# Patient Record
Sex: Male | Born: 1960 | Race: White | Hispanic: No | Smoking: Never smoker
Health system: Southern US, Community
[De-identification: ages and names within clinical notes are randomized; demographics above are authoritative.]

## PROBLEM LIST (undated history)

## (undated) ENCOUNTER — Emergency Department: Admission: EM | Discharge status: Absent without leave | Payer: Self-pay

## (undated) DIAGNOSIS — G4730 Sleep apnea, unspecified: Secondary | ICD-10-CM

## (undated) DIAGNOSIS — I499 Cardiac arrhythmia, unspecified: Secondary | ICD-10-CM

## (undated) DIAGNOSIS — G629 Polyneuropathy, unspecified: Secondary | ICD-10-CM

## (undated) DIAGNOSIS — E785 Hyperlipidemia, unspecified: Secondary | ICD-10-CM

## (undated) DIAGNOSIS — F419 Anxiety disorder, unspecified: Secondary | ICD-10-CM

## (undated) DIAGNOSIS — B019 Varicella without complication: Secondary | ICD-10-CM

## (undated) DIAGNOSIS — I4891 Unspecified atrial fibrillation: Secondary | ICD-10-CM

## (undated) DIAGNOSIS — M2540 Effusion, unspecified joint: Secondary | ICD-10-CM

## (undated) DIAGNOSIS — G4733 Obstructive sleep apnea (adult) (pediatric): Secondary | ICD-10-CM

## (undated) DIAGNOSIS — M2550 Pain in unspecified joint: Secondary | ICD-10-CM

## (undated) DIAGNOSIS — F1010 Alcohol abuse, uncomplicated: Secondary | ICD-10-CM

## (undated) DIAGNOSIS — M1990 Unspecified osteoarthritis, unspecified site: Secondary | ICD-10-CM

## (undated) DIAGNOSIS — R200 Anesthesia of skin: Secondary | ICD-10-CM

## (undated) DIAGNOSIS — Z8489 Family history of other specified conditions: Secondary | ICD-10-CM

## (undated) DIAGNOSIS — R002 Palpitations: Secondary | ICD-10-CM

## (undated) DIAGNOSIS — I10 Essential (primary) hypertension: Secondary | ICD-10-CM

## (undated) HISTORY — DX: Alcohol abuse, uncomplicated: F10.10

## (undated) HISTORY — DX: Sleep apnea, unspecified: G47.30

## (undated) HISTORY — DX: Essential (primary) hypertension: I10

## (undated) HISTORY — PX: KNEE SURGERY: SHX244

## (undated) HISTORY — DX: Varicella without complication: B01.9

## (undated) HISTORY — PX: SPERMATOCELECTOMY: SHX2420

---
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
## 2004-02-22 HISTORY — PX: SHOULDER ARTHROSCOPY WITH OPEN ROTATOR CUFF REPAIR: SHX6092

## 2004-02-22 HISTORY — PX: INCISION AND DRAINAGE OF WOUND: SHX1803

## 2004-07-02 ENCOUNTER — Ambulatory Visit (HOSPITAL_COMMUNITY): Admission: RE | Admit: 2004-07-02 | Discharge: 2004-07-03 | Payer: Self-pay | Admitting: Orthopedic Surgery

## 2004-07-13 ENCOUNTER — Ambulatory Visit (HOSPITAL_COMMUNITY): Admission: RE | Admit: 2004-07-13 | Discharge: 2004-07-13 | Payer: Self-pay | Admitting: Orthopedic Surgery

## 2004-07-13 ENCOUNTER — Ambulatory Visit: Payer: Self-pay | Admitting: Internal Medicine

## 2004-07-29 ENCOUNTER — Ambulatory Visit: Payer: Self-pay | Admitting: Internal Medicine

## 2006-05-10 ENCOUNTER — Emergency Department (HOSPITAL_COMMUNITY): Admission: EM | Admit: 2006-05-10 | Discharge: 2006-05-10 | Payer: Self-pay | Admitting: Family Medicine

## 2006-07-23 ENCOUNTER — Emergency Department: Payer: Self-pay | Admitting: Emergency Medicine

## 2008-06-16 ENCOUNTER — Encounter: Admission: RE | Admit: 2008-06-16 | Discharge: 2008-06-16 | Payer: Self-pay | Admitting: Cardiovascular Disease

## 2009-03-07 ENCOUNTER — Ambulatory Visit: Payer: Self-pay | Admitting: Family Medicine

## 2009-03-07 DIAGNOSIS — M25569 Pain in unspecified knee: Secondary | ICD-10-CM

## 2009-03-07 DIAGNOSIS — F411 Generalized anxiety disorder: Secondary | ICD-10-CM

## 2009-03-07 HISTORY — DX: Pain in unspecified knee: M25.569

## 2009-03-07 HISTORY — DX: Generalized anxiety disorder: F41.1

## 2009-03-08 ENCOUNTER — Telehealth (INDEPENDENT_AMBULATORY_CARE_PROVIDER_SITE_OTHER): Payer: Self-pay

## 2009-11-21 ENCOUNTER — Emergency Department (HOSPITAL_BASED_OUTPATIENT_CLINIC_OR_DEPARTMENT_OTHER): Admission: EM | Admit: 2009-11-21 | Discharge: 2009-11-21 | Payer: Self-pay | Admitting: Emergency Medicine

## 2009-11-21 ENCOUNTER — Ambulatory Visit: Payer: Self-pay | Admitting: Diagnostic Radiology

## 2010-03-23 NOTE — Assessment & Plan Note (Signed)
Summary: R KNEE PAINx 2 dyas rm 3   Vital Signs:  Patient Profile:   50 Years Old Male CC:      R Knee pain - x 2 days Height:     78 inches Weight:      262 pounds O2 Sat:      100 % O2 treatment:    Room Air Temp:     99.2 degrees F oral Pulse rate:   69 / minute Pulse rhythm:   regular Resp:     16 per minute BP sitting:   126 / 85  (right arm) Cuff size:   regular  Vitals Entered By: Areta Haber CMA (March 07, 2009 3:17 PM)                  Current Allergies: No known allergies History of Present Illness History from: patient Chief Complaint: R Knee pain - x 2 days History of Present Illness: 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 Has been told in the past he needs a right knee replacement. Comes today c/o Right knee pain for two days; started after he slipped in the ice while walking his dog. Describes as if his right knee was overstretched in a varus position while his foot was caught up in the floor by wood. has developed incresed pain sweeling and is limping since. Bearing weight but uncomfortable with walking. Has crutches at home but no using. Taking tylenol nuber 3 w/o significant relief took only one pill of motrin 800mg  two days ago.  Current Problems: KNEE PAIN, RIGHT (ICD-719.46) ANXIETY (ICD-300.00)   Current Meds PAXIL 20 MG TABS (PAROXETINE HCL) 1 tab by mouth once daily IBUPROFEN 800 MG TABS (IBUPROFEN) as directed TYLENOL WITH CODEINE #3 300-30 MG TABS (ACETAMINOPHEN-CODEINE) as needed for prn IBUPROFEN 800 MG TABS (IBUPROFEN) 1 tab by mouth three times a day as needed for pain VICODIN 5-500 MG TABS (HYDROCODONE-ACETAMINOPHEN) 1-2 tabs by mouth qid as needed.  REVIEW OF SYSTEMS Constitutional Symptoms      Denies fever, chills, night sweats, weight loss, weight gain, and fatigue.  Eyes       Denies change in vision, eye pain, eye discharge, glasses, contact lenses, and eye surgery. Ear/Nose/Throat/Mouth       Denies  hearing loss/aids, change in hearing, ear pain, ear discharge, dizziness, frequent runny nose, frequent nose bleeds, sinus problems, sore throat, hoarseness, and tooth pain or bleeding.  Respiratory       Denies dry cough, productive cough, wheezing, shortness of breath, asthma, bronchitis, and emphysema/COPD.  Cardiovascular       Denies murmurs, chest pain, and tires easily with exhertion.    Gastrointestinal       Denies stomach pain, nausea/vomiting, diarrhea, constipation, blood in bowel movements, and indigestion. Genitourniary       Denies painful urination, kidney stones, and loss of urinary control. Neurological       Denies paralysis, seizures, and fainting/blackouts. Musculoskeletal       Complains of decreased range of motion, redness, and swelling.      Denies muscle pain, joint pain, joint stiffness, muscle weakness, and gout.      Comments: R Knee - pain Skin       Denies bruising, unusual mles/lumps or sores, and hair/skin or nail changes.  Psych       Denies mood changes, temper/anger issues, anxiety/stress, speech problems, depression, and sleep problems. Other Comments: Pt states that he was walking his dog and twisted  his R knee.   Past History:  Past Medical History: Anxiety  Past Surgical History: Shoulder- R R knee Physical Exam General appearance: well developed, well nourished, no acute distress Head: normocephalic, atraumatic Extremities: Right Knee: No obvious effusion or deformity. Tender to palpation over tibial crase entirelly. Specially in the lateral area. No focal tenderness over tibial shaft. No bruising or hematoma. Limited bending and extending due to pain. Pain with patellar compression. Unable to perform meniscal maneuvers due to limited ROM and reported pain. Neurological: Intact superficial sensation in the right lower extremity. Assessment New Problems: KNEE PAIN, RIGHT (ICD-719.46) ANXIETY (ICD-300.00)   Plan New  Medications/Changes: VICODIN 5-500 MG TABS (HYDROCODONE-ACETAMINOPHEN) 1-2 tabs by mouth qid as needed.  #25 x 0, 03/07/2009, Ayjah Show Moreno-Coll  MD IBUPROFEN 800 MG TABS (IBUPROFEN) 1 tab by mouth three times a day as needed for pain  #30 x 1, 03/07/2009, Shyloh Derosa Moreno-Coll  MD  New Orders: T-DG Knee Complete 4 Views*R* [16109] Est. Patient Level III [60454]  The patient and/or caregiver has been counseled thoroughly with regard to medications prescribed including dosage, schedule, interactions, rationale for use, and possible side effects and they verbalize understanding.  Diagnoses and expected course of recovery discussed and will return if not improved as expected or if the condition worsens. Patient and/or caregiver verbalized understanding.  Prescriptions: VICODIN 5-500 MG TABS (HYDROCODONE-ACETAMINOPHEN) 1-2 tabs by mouth qid as needed.  #25 x 0   Entered and Authorized by:   Sharin Grave  MD   Signed by:   Sharin Grave  MD on 03/07/2009   Method used:   Printed then faxed to ...       [facility] [phone]* (retail)       [address] [place], [place]  [address]       Ph: [phone]       Fax: [phone]   RxID:   [id number] IBUPROFEN 800 MG TABS (IBUPROFEN) 1 tab by mouth three times a day as needed for pain  #30 x 1   Entered and Authorized by:   Sharin Grave  MD   Signed by:   Sharin Grave  MD on 03/07/2009   Method used:   Printed then faxed to ...       [facility] [phone]* (retail)       [address] [place], [place]  [address]       Ph: [phone]       Fax: [phone]   RxID:   [id number]   Patient Instructions: 1)  As discussed there is no signs of fractures or dislocation in your right knee. 2)  There is small ammount of fluid that I expect to reabsorb on its own. Keep the knee imobilizer and use crutches during your trip. 3)  Can remove immobilizer and do some knee flexion and extension excercises once pain  better about 10 repetitions( two or 3 times a day) 4)  Take the prescribed pain medications as instructed. 5)  Follow up with your orthopedist if persistent or worsening pain after 1-2 weeks as you might need an MRI for further characterization of your knee condition.

## 2010-03-23 NOTE — Progress Notes (Signed)
  Phone Note Call from Patient   Caller: Patient Summary of Call: Pt clld LMOVM req stronger medication. Clld pt back LMOVN of his cell  per physician no stronger pain medication will be prescribed. He will need to make sure he follows up with Orthopedist. Initial call taken by: Areta Haber CMA,  March 08, 2009 4:13 PM

## 2010-07-09 NOTE — Op Note (Signed)
NAMEIZIK, BINGMAN        ACCOUNT NO.:  [id number]   MEDICAL RECORD NO.:  [id number]          PATIENT TYPE:  OIB   LOCATION:  2899                         FACILITY:  [facility]   PHYSICIAN:  Deidre Ala, M.D.    DATE OF BIRTH:  06-Apr-1960   DATE OF PROCEDURE:  07/13/2004  DATE OF DISCHARGE:                                 OPERATIVE REPORT   PREOPERATIVE DIAGNOSIS:  Persistent infection versus rejection of right  shoulder rotator cuff repair - suture bridge with Fibrewire and retained  anchors, bioabsorbable.   POSTOPERATIVE DIAGNOSIS:  Persistent infection versus rejection of right  shoulder rotator cuff repair - suture bridge with Fibrewire and retained  anchors, bioabsorbable.   PROCEDURE:  Irrigation and debridement, right shoulder with removal of six  BioSorb type anchors and Fibrewire sutures from shoulder with irrigation and  drainage of wound.   SURGEON:  Doristine Section, MD   ASSISTANT:  Clarene Reamer, PA-C   ANESTHESIA:  General endotracheal.   CULTURES:  Taken of the lateral anchors, push-locks, and the medial anchors  Biocorkscrews, and sent for AFE, fungal, and aerobic and anaerobic as well  as some rotator cuff sent for culture as well as all the sutures sent for  culture.   DRAINS:  One three-quarter-inch Penrose through previous I&D site.   ESTIMATED BLOOD LOSS:  300 mL, replacement without.   PATHOLOGIC FINDINGS AND HISTORY:  This is a difficult case.  This is a  healthy 50 year old, who underwent at [facility] a right shoulder  subacromial AC decompression and acromioplasty, distal clavicle resection,  and a mini open rotator cuff repair using the new Arthrex Suture Bridge  System with three proximal Biocorkscrews and three lateral anchors on the  tuberosity push-locks.  The composition of the push-locks is polyether ether  ketone and of the Biocorkscrews is PLLA.  The patient postoperatively began  to point what looked to be an abscess.  We took  him to the operating room.  His first surgery was done on April 20.  We took him back to the operating  room, May 12.  I thoroughly drained him but left the anchor material in  place because we did not want to compromise the rotator cuff tear.  We  thoroughly jet-lavaged him in the hope that we could avoid removal of the  implants.  We placed him on IV vancomycin via a PICC line, but his drainage  persisted and began to point up in the area of the rotator cuff wound which  a little more proximal.  We never were able to isolate any organism.  We got  no growth on his cultures.  Infectious disease was consulted.  They felt  after this coming I&D procedure when the implants were removed as well as  the suture, that he should be placed on oral Cipro and that he should be  fully cultured just to make sure there was no implant contamination with  atypical organism or fungus, and this was done.  At surgery, we removed the  three lateral push-locks and the three medial anchors and took out all  Fibrewire and Ethibond nonabsorbable  suture as well as some Vicryl sutures.  After this was thoroughly debrided and some bone around the anchors was  excised with the finding of no gross evidence of osteomyelitis, we debrided  further the edges of the rotator cuff, medially and laterally, to healthy  clean tissue.  We also did a manipulation of the shoulder since he had  stiffened up a bit to essentially full range of motion and abduction and  forward flexion, and we thoroughly jet lavaged with 6 L of saline with 0.5 L  of antibiotic solution, closed him with a single layer closure with #1  Ethilon retention sutures, vertical mattress, with the drain coming out the  previous drain hole as we had had before.  The patient will be admitted  overnight.  He will be sent home on oral Cipro with his dressing changed  tomorrow.  He did have a preoperative scalene block.  We are, of course,  hopeful that the removal  of the implants will get rid of any rejection  phenomenon he may be having or if it is infected, get rid of the harboring  of any bacteria that makes this infection not able to be controlled.   DESCRIPTION OF PROCEDURE:  With adequate anesthesia obtained using  endotracheal technique, later in the case after the implants were removed,  we did give him 400 mg of IV Cipro.  He had some vancomycin running when he  came to the room.  The patient was placed in a supine beach chair position,  and the right shoulder was prepped and draped in the standard fashion.  After standard prepping and draping, the old rotator cuff incision, about  2.5 inches in length was incised down to the deltoid, incision deepened and  sharpened with a knife, and hemostasis obtained using a Engineer, drilling.  The other wound that we had used with two retained  sutures was left in place for later use for a drain.  We then sequentially  removed the anchors.  This was not an easy task because some of them fell  into the osteoporotic bone below the greater tuberosity, but we were able to  subsequently find all of them and pull them out through a lattice of some  holes that we made with the drill to try to get these anchors out.  Both the  proximal and distal were removed, and all parts of them were removed, and  our equipment rep Kastiel Canales, with Arthrex, was in the operating room to make  sure that we got all the material.  We also removed all the Fibrewire and  the Ethibond suture and then debrided the rotator cuff edges that looked  like they were not viable.  We then thoroughly jet-lavaged the shoulder with  6 L and 0.5 L of triple antibiotic.  We closed it as listed above, placed a  new three-quarter-inch drain through the old drain hole up to the area of  the acromion and out and sewed the drain in.  Staples were used on the superficial wound to seal it, and a bulky sterile compressive dressing was  applied with  sling.  The patient having tolerated the procedure well, was  awakened and taken to the recovery room in satisfactory condition to be  admitted for routine postoperative care and overnight observation and  analgesia.      VEP/MEDQ  D:  07/13/2004  T:  07/13/2004  Job:  [id number]

## 2010-07-09 NOTE — Op Note (Signed)
NAMEKINDRICK, LANKFORD        ACCOUNT NO.:  [id number]   MEDICAL RECORD NO.:  [id number]          PATIENT TYPE:  OIB   LOCATION:  2860                         FACILITY:  [facility]   PHYSICIAN:  Deidre Ala, M.D.    DATE OF BIRTH:  [birth year]   DATE OF PROCEDURE:  07/02/2004  DATE OF DISCHARGE:                                 OPERATIVE REPORT   PREOPERATIVE DIAGNOSES:  1.  Postoperative infection, right shoulder, status post rotator cuff repair      three weeks ago.  2.  Right shoulder arthrofibrosis.   POSTOPERATIVE DIAGNOSES:  1.  Postoperative infection, right shoulder, status post rotator cuff repair      three weeks ago.  2.  Right shoulder arthrofibrosis.   PROCEDURES:  1.  Right shoulder irrigation, drainage and debridement of infection to      joint with arthrotomy.  2.  Shoulder manipulation, closed.   SURGEON:  1.  Charlesetta Shanks, M.D.   ASSISTANT:  Clarene Reamer, P.A.-C.   ANESTHESIA:  General endotracheal.   CULTURES:  Cultures taken of the infection and sent for Gram's stain.   DRAINS:  One 3/4 inch Penrose.   ESTIMATED BLOOD LOSS:  100 mL without replacement.   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  He had an Arthrex suture bridge used for the  repair with __________ sutures with good repair obtained.  At 10 days  postoperatively, he began to have unusual stiffness, but there were no  localizing findings of infection.  Then yesterday he began to appoint an  abscess and it increased today and extended down to the rotator cuff, we  felt.  At surgery, this area drained about 20 mL of purulence.  It tracked  up to the distal clavicle and to the area of the rotator cuff repair, which  appeared by digital palpation to still be intact with a fibrinous layer over  top of the repair.  The infection tracked down beneath the  deltoid  anteriorly to the underlying rotator cuff and bone and was digitally  released of all adhesions and thoroughly jet lavaged with 6 L of saline and  500 mL of triple antibiotic solution.  We then manipulated the shoulder and  got essentially full range of motion where he was limited to internal and  external rotation, arc of about 20 degrees, abduction of about 50 degrees  and forward flexion to essentially full.   DESCRIPTION OF PROCEDURE:  With adequate anesthesia obtained using  endotracheal technique, the patient was placed in the supine position.  The  right shoulder was prepped and draped in the standard fashion.  The  appointing abscess just anterior to the anterolateral portal and wound for  the rotator cuff repair and lateral to the anterior portal was incised  through about a 2 cm opening down through the deltoid into the shoulder  joint where it tracked.  We expressed the purulence and cultured it.  We  then finger dissected the adhesions in the space that had  the exudate within  it.  We then thoroughly jet lavaged it with a sweeping motion all of its  recesses.  When the irrigation was carried out, we manipulated the shoulder  the above findings.  We then sutured the upper portion of the wound created  for the drainage with 2-0 nylon vertical mattress sutures, single layer  closure, and then sewed the drain in that we had placed up near the acromion  and out the wound to drain dependently with the Penrose and sutured it so it  would not go in or out.  A bulky sterile compressive dressing was applied.  The patient had had a preoperative scalene block.  After the cultures were  taken, he was given 1 g of vancomycin IV.  He will be placed with a PICC  line so that he may go home on IV antibiotics until we establish the nature  of the infection culture wise and possibly modify his antibiotics.  He will  be admitted for postoperative care and analgesia.      VEP/MEDQ  D:   07/02/2004  T:  07/04/2004  Job:  [id number]

## 2011-08-17 ENCOUNTER — Ambulatory Visit (INDEPENDENT_AMBULATORY_CARE_PROVIDER_SITE_OTHER): Payer: BC Managed Care – PPO | Admitting: Family Medicine

## 2011-08-17 ENCOUNTER — Encounter: Payer: Self-pay | Admitting: Family Medicine

## 2011-08-17 VITALS — BP 120/80 | HR 80 | Temp 98.2°F | Resp 12 | Ht 78.0 in | Wt 256.0 lb

## 2011-08-17 DIAGNOSIS — Z0000 Encounter for general adult medical examination without abnormal findings: Secondary | ICD-10-CM

## 2011-08-17 DIAGNOSIS — Z23 Encounter for immunization: Secondary | ICD-10-CM

## 2011-08-17 DIAGNOSIS — F1011 Alcohol abuse, in remission: Secondary | ICD-10-CM

## 2011-08-17 HISTORY — DX: Alcohol abuse, in remission: F10.11

## 2011-08-17 LAB — LIPID PANEL
Cholesterol: 256 mg/dL — ABNORMAL HIGH (ref 0–200)
Total CHOL/HDL Ratio: 6
Triglycerides: 368 mg/dL — ABNORMAL HIGH (ref 0.0–149.0)

## 2011-08-17 LAB — BASIC METABOLIC PANEL
BUN: 20 mg/dL (ref 6–23)
Calcium: 9.7 mg/dL (ref 8.4–10.5)
Creatinine, Ser: 0.6 mg/dL (ref 0.4–1.5)
GFR: 143.96 mL/min (ref >60.00)

## 2011-08-17 LAB — CBC WITH DIFFERENTIAL/PLATELET
Basophils Absolute: 0 10*3/uL (ref 0.0–0.1)
HCT: 41 % (ref 39.0–52.0)
Hemoglobin: 14 g/dL (ref 13.0–17.0)
Lymphs Abs: 2.2 10*3/uL (ref 0.7–4.0)
MCHC: 34 g/dL (ref 30.0–36.0)
MCV: 91.5 fl (ref 78.0–100.0)
Monocytes Absolute: 0.6 10*3/uL (ref 0.1–1.0)
Monocytes Relative: 8.9 % (ref 3.0–12.0)
Neutro Abs: 4.2 10*3/uL (ref 1.4–7.7)
RDW: 13.6 % (ref 11.5–14.6)

## 2011-08-17 NOTE — Progress Notes (Signed)
Subjective:    Patient ID: Jimmy Black, male    DOB: 10/21/1960, 51 y.o.   MRN: [id number]  HPI  Patient seen to establish care. Here for complete physical. Past medical history reviewed. History of osteoarthritis of the right knee and left hip. He sees orthopedist. Considering right total knee replacement soon. Reported history of mild hyperlipidemia. Past history of alcohol abuse but abstinent now for 13 years. He sees psychiatrist and treated with Paxil 40 mg daily for chronic anxiety. He had prior history of dependency on opioids and is being tapered off at this time.  Patient's had multiple surgeries right knee and right shoulder for rotator cuff injury.  Nonsmoker  History of alcohol use as above. Family history mom breast cancer at 37. Father with prostate cancer history. Father had MI age 69. Patient is divorced. Four y ear old daughter.  Last tetanus unknown  Past Medical History  Diagnosis Date  . Alcohol abuse   . Arthritis   . Chicken pox    Past Surgical History  Procedure Date  . Knee surgery 1967, 1973, 1980    right  . Shoulder arthroscopy w/ rotator cuff repair 2006    right    reports that he has never smoked. He does not have any smokeless tobacco history on file. His alcohol and drug histories not on file. family history includes Alcohol abuse in his paternal aunt and paternal grandfather; Cancer in his father and maternal aunt; Cancer (age of onset:35) in his mother; Heart disease in his maternal grandfather, mother, and paternal grandfather; and Hyperlipidemia in his brother and mother. No Known Allergies   Review of Systems  Constitutional: Negative for fever, activity change, appetite change and fatigue.  HENT: Negative for ear pain, congestion and trouble swallowing.   Eyes: Negative for pain and visual disturbance.  Respiratory: Negative for cough, shortness of breath and wheezing.   Cardiovascular: Negative for chest pain and palpitations.    Gastrointestinal: Negative for nausea, vomiting, abdominal pain, diarrhea, constipation, blood in stool, abdominal distention and rectal pain.  Genitourinary: Negative for dysuria, hematuria and testicular pain.  Musculoskeletal: Positive for arthralgias. Negative for myalgias and joint swelling.  Skin: Negative for rash.  Neurological: Negative for dizziness, syncope and headaches.  Hematological: Negative for adenopathy.  Psychiatric/Behavioral: Negative for confusion and dysphoric mood.       Objective:   Physical Exam  Constitutional: He is oriented to person, place, and time. He appears well-developed and well-nourished. No distress.  HENT:  Head: Normocephalic and atraumatic.  Right Ear: External ear normal.  Left Ear: External ear normal.  Mouth/Throat: Oropharynx is clear and moist.  Eyes: Conjunctivae and EOM are normal. Pupils are equal, round, and reactive to light.  Neck: Normal range of motion. Neck supple. No thyromegaly present.  Cardiovascular: Normal rate, regular rhythm and normal heart sounds.   No murmur heard. Pulmonary/Chest: No respiratory distress. He has no wheezes. He has no rales.  Abdominal: Soft. Bowel sounds are normal. He exhibits no distension and no mass. There is no tenderness. There is no rebound and no guarding.  Musculoskeletal: He exhibits no edema.       Patient has extensive scarring right shoulder and right knee. Restricted flexion right knee.  No effusion  Lymphadenopathy:    He has no cervical adenopathy.  Neurological: He is alert and oriented to person, place, and time. He displays normal reflexes. No cranial nerve deficit.  Skin: No rash noted.  Psychiatric: He has a normal  mood and affect.          Assessment & Plan:   Complete physical. Tetanus booster given. Obtain screening labs. Add PSA with positive family history of prostate cancer in father.

## 2011-08-19 ENCOUNTER — Telehealth: Payer: Self-pay | Admitting: Family Medicine

## 2011-08-19 DIAGNOSIS — Z299 Encounter for prophylactic measures, unspecified: Secondary | ICD-10-CM

## 2011-08-19 NOTE — Progress Notes (Signed)
Quick Note:  Pt informed on personally identified VM ______ 

## 2011-08-19 NOTE — Telephone Encounter (Signed)
Caller: Antuan/Patient; PCP: Evelena Peat; CB#: [phone]. Call regarding Question for MD. Caller reports he was seen a few weeks ago and had a physical. Caller reports he rec'd his labs and his chol is higher than he would like, although MD did not make recommendations. Caller asking to begin a low dose statin, reporting his family has a lot of problems controlling chol. Caller advised a note will be sent for PCP, but he will not get a call back until Monday 7/1. He is agreeable and can be reached at above #. Pharmacy is [facility] off [address]

## 2011-08-21 NOTE — Telephone Encounter (Signed)
His Total cholesterol is high but LDL (which is what we base statin treatment decisions on primarily is 85 which is good)  We could start statin, but based on national guidelines his LDL does not justify.  I would recommend omega 3 supplement (2-3 grams per day) and exercise/saturated fat reduction and consider repeat lipid in 6 months.

## 2011-08-22 NOTE — Telephone Encounter (Signed)
Pt informed on personally identified VM, future lab ordered

## 2011-10-31 ENCOUNTER — Other Ambulatory Visit: Payer: Self-pay | Admitting: Orthopedic Surgery

## 2011-11-08 ENCOUNTER — Encounter (HOSPITAL_COMMUNITY): Payer: Self-pay | Admitting: Pharmacy Technician

## 2011-11-10 ENCOUNTER — Encounter (HOSPITAL_COMMUNITY)
Admission: RE | Admit: 2011-11-10 | Discharge: 2011-11-10 | Disposition: A | Discharge status: Home / Self Care | Payer: Medicare Other | Source: Ambulatory Visit | Attending: Orthopedic Surgery | Admitting: Orthopedic Surgery

## 2011-11-10 ENCOUNTER — Encounter (HOSPITAL_COMMUNITY): Payer: Self-pay

## 2011-11-10 HISTORY — DX: Anxiety disorder, unspecified: F41.9

## 2011-11-10 LAB — CBC WITH DIFFERENTIAL/PLATELET
Eosinophils Absolute: 0.1 10*3/uL (ref 0.0–0.7)
Eosinophils Relative: 1 % (ref 0–5)
Hemoglobin: 14.8 g/dL (ref 13.0–17.0)
Lymphs Abs: 1.8 10*3/uL (ref 0.7–4.0)
MCH: 31.3 pg (ref 26.0–34.0)
MCV: 88.4 fL (ref 78.0–100.0)
Monocytes Relative: 6 % (ref 3–12)
Neutrophils Relative %: 78 % — ABNORMAL HIGH (ref 43–77)
RBC: 4.73 MIL/uL (ref 4.22–5.81)

## 2011-11-10 LAB — BASIC METABOLIC PANEL
CO2: 26 mEq/L (ref 19–32)
Chloride: 101 mEq/L (ref 96–112)
Glucose, Bld: 95 mg/dL (ref 70–99)
Potassium: 4.5 mEq/L (ref 3.5–5.1)
Sodium: 137 mEq/L (ref 135–145)

## 2011-11-10 LAB — URINALYSIS, ROUTINE W REFLEX MICROSCOPIC
Ketones, ur: 15 mg/dL — AB
Leukocytes, UA: NEGATIVE
Nitrite: NEGATIVE
Protein, ur: NEGATIVE mg/dL
Urobilinogen, UA: 1 mg/dL (ref 0.0–1.0)
pH: 6 (ref 5.0–8.0)

## 2011-11-10 LAB — TYPE AND SCREEN

## 2011-11-10 LAB — PROTIME-INR: INR: 0.98 (ref 0.00–1.49)

## 2011-11-10 LAB — SURGICAL PCR SCREEN: MRSA, PCR: NEGATIVE

## 2011-11-10 MED ORDER — CHLORHEXIDINE GLUCONATE 4 % EX LIQD: 60.0000 mL | Freq: Once | CUTANEOUS | Status: DC

## 2011-11-10 NOTE — Pre-Procedure Instructions (Signed)
20 Jimmy Black  11/10/2011   Your procedure is scheduled on:  Sept 25, 2013  Report to [facility] at 10:45 AM.  Call this number if you have problems the morning of surgery: [phone]   Remember:   Do not eat food:After Midnight.    Take these medicines the morning of surgery with A SIP OF WATER: paxil   Do not wear jewelry, make-up or nail polish.  Do not wear lotions, powders, or perfumes. You may wear deodorant.  Do not shave 48 hours prior to surgery. Men may shave face and neck.  Do not bring valuables to the hospital.  Contacts, dentures or bridgework may not be worn into surgery.  Leave suitcase in the car. After surgery it may be brought to your room.  For patients admitted to the hospital, checkout time is 11:00 AM the day of discharge.   Patients discharged the day of surgery will not be allowed to drive home.  Name and phone number of your driver:   Special Instructions: Incentive Spirometry - Practice and bring it with you on the day of surgery. and CHG Shower Shower 2 days before surgery and 1 day before surgery with Hibiclens.   Please read over the following fact sheets that you were given: Pain Booklet, Coughing and Deep Breathing, Blood Transfusion Information, Total Joint Packet and Surgical Site Infection Prevention

## 2011-11-14 NOTE — H&P (Signed)
  Subjective: This is a patient of Dr. Renae Fickle who comes in today for evaluation of right knee pain.  He has end-stage arthritis by x-ray and reports that his pain is now interfering with his daily activities and wakes him from sleep at night.  He has tried anti-inflammatory and pain medications but at this point is interested in pursuing knee replacement.  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  PMH: He has a history of an infection in his right shoulder after a rotator cuff repair by Dr. Renae Fickle.  He had arthroscopic washout and was on IV vancomycin through PICC line for several weeks.  Otherwise he is healthy and has no history of hypertension or diabetes.  He takes Paxil.  Review of systems is positive for depression.  Otherwise negative aside from the chief complaint.  ROS: Patient denies dizziness, nausea, fever, chills, vomiting, shortness of breath, chest pain, loss of appetite, or rash.    Objective: Well-developed, well-nourished 51 year old gentleman who is alert and oriented and in no acute distress.  Extraocular motion is intact.  No use of accessory respiratory muscles for breathing.   Cardiovascular exam reveals a regular rhythm.  Skin is intact without cuts, scrapes, or abrasions. Exam of the right knee demonstrates range of motion to be 5-90.  He has a trace effusion and tenderness to palpation along the joint lines.  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  He is neurovascularly intact.  X-rays: Previous x-rays of the right knee were reviewed and demonstrate end-stage arthritis in the medial compartment of the right knee  Assess: End-stage arthritis of the right knee  Plan: We have discussed knee replacement in detail with the patient today.  He understands all risks and benefits.  He will be given vancomycin preoperatively instead of Ancef.  We look  forward to seeing him at the time of surgical intervention.

## 2011-11-15 MED ORDER — SODIUM CHLORIDE 0.9 % IV SOLN
1500.0000 mg | INTRAVENOUS | Status: AC
  Administered 2011-11-16: 1500 mg via INTRAVENOUS
  Filled 2011-11-15: qty 1500

## 2011-11-16 ENCOUNTER — Ambulatory Visit (HOSPITAL_COMMUNITY): Payer: Medicare Other | Admitting: Anesthesiology

## 2011-11-16 ENCOUNTER — Inpatient Hospital Stay (HOSPITAL_COMMUNITY)
Admission: RE | Admit: 2011-11-16 | Discharge: 2011-11-18 | DRG: 470 | Disposition: A | Discharge status: Home Health | Payer: Medicare Other | Source: Ambulatory Visit | Attending: Orthopedic Surgery | Admitting: Orthopedic Surgery

## 2011-11-16 ENCOUNTER — Encounter (HOSPITAL_COMMUNITY)
Admission: RE | Disposition: A | Discharge status: Home Health | Payer: Self-pay | Source: Ambulatory Visit | Attending: Orthopedic Surgery

## 2011-11-16 ENCOUNTER — Encounter (HOSPITAL_COMMUNITY): Payer: Self-pay | Admitting: Anesthesiology

## 2011-11-16 ENCOUNTER — Encounter (HOSPITAL_COMMUNITY): Payer: Self-pay | Admitting: *Deleted

## 2011-11-16 DIAGNOSIS — E785 Hyperlipidemia, unspecified: Secondary | ICD-10-CM | POA: Diagnosis present

## 2011-11-16 DIAGNOSIS — E669 Obesity, unspecified: Secondary | ICD-10-CM | POA: Diagnosis present

## 2011-11-16 DIAGNOSIS — M1711 Unilateral primary osteoarthritis, right knee: Secondary | ICD-10-CM

## 2011-11-16 DIAGNOSIS — M1710 Unilateral primary osteoarthritis, unspecified knee: Principal | ICD-10-CM | POA: Diagnosis present

## 2011-11-16 DIAGNOSIS — Z01818 Encounter for other preprocedural examination: Secondary | ICD-10-CM

## 2011-11-16 DIAGNOSIS — Z01810 Encounter for preprocedural cardiovascular examination: Secondary | ICD-10-CM

## 2011-11-16 DIAGNOSIS — Z01812 Encounter for preprocedural laboratory examination: Secondary | ICD-10-CM

## 2011-11-16 DIAGNOSIS — Z0000 Encounter for general adult medical examination without abnormal findings: Secondary | ICD-10-CM

## 2011-11-16 DIAGNOSIS — F411 Generalized anxiety disorder: Secondary | ICD-10-CM | POA: Diagnosis present

## 2011-11-16 HISTORY — PX: TOTAL KNEE ARTHROPLASTY: SHX125

## 2011-11-16 HISTORY — DX: Unilateral primary osteoarthritis, right knee: M17.11

## 2011-11-16 SURGERY — ARTHROPLASTY, KNEE, TOTAL
Anesthesia: Regional | Site: Knee | Laterality: Right | Wound class: Clean

## 2011-11-16 MED ORDER — ONDANSETRON HCL 4 MG/2ML IJ SOLN: 4.0000 mg | Freq: Four times a day (QID) | INTRAMUSCULAR | Status: DC | PRN

## 2011-11-16 MED ORDER — LIDOCAINE HCL (CARDIAC) 20 MG/ML IV SOLN
INTRAVENOUS | Status: DC | PRN
  Administered 2011-11-16: 80 mg via INTRAVENOUS

## 2011-11-16 MED ORDER — MIDAZOLAM HCL 5 MG/5ML IJ SOLN
INTRAMUSCULAR | Status: DC | PRN
  Administered 2011-11-16: 2 mg via INTRAVENOUS

## 2011-11-16 MED ORDER — PHENOL 1.4 % MT LIQD: 1.0000 | OROMUCOSAL | Status: DC | PRN

## 2011-11-16 MED ORDER — MAGNESIUM HYDROXIDE 400 MG/5ML PO SUSP: 30.0000 mL | Freq: Every day | ORAL | Status: DC | PRN

## 2011-11-16 MED ORDER — METHOCARBAMOL 100 MG/ML IJ SOLN
500.0000 mg | INTRAVENOUS | Status: AC
  Administered 2011-11-16: 500 mg via INTRAVENOUS
  Filled 2011-11-16: qty 5

## 2011-11-16 MED ORDER — METOCLOPRAMIDE HCL 5 MG/ML IJ SOLN
5.0000 mg | Freq: Three times a day (TID) | INTRAMUSCULAR | Status: DC | PRN
  Filled 2011-11-16: qty 2

## 2011-11-16 MED ORDER — ACETAMINOPHEN 325 MG PO TABS: 650.0000 mg | ORAL_TABLET | Freq: Four times a day (QID) | ORAL | Status: DC | PRN

## 2011-11-16 MED ORDER — METOCLOPRAMIDE HCL 10 MG PO TABS: 5.0000 mg | ORAL_TABLET | Freq: Three times a day (TID) | ORAL | Status: DC | PRN

## 2011-11-16 MED ORDER — DIPHENHYDRAMINE HCL 12.5 MG/5ML PO ELIX: 12.5000 mg | ORAL_SOLUTION | ORAL | Status: DC | PRN

## 2011-11-16 MED ORDER — NEOSTIGMINE METHYLSULFATE 1 MG/ML IJ SOLN
INTRAMUSCULAR | Status: DC | PRN
  Administered 2011-11-16: 3 mg via INTRAVENOUS

## 2011-11-16 MED ORDER — HYDROMORPHONE HCL PF 1 MG/ML IJ SOLN
INTRAMUSCULAR | Status: AC
  Filled 2011-11-16: qty 1

## 2011-11-16 MED ORDER — BUPIVACAINE-EPINEPHRINE PF 0.5-1:200000 % IJ SOLN
INTRAMUSCULAR | Status: DC | PRN
  Administered 2011-11-16: 30 mL

## 2011-11-16 MED ORDER — DIPHENHYDRAMINE HCL 50 MG/ML IJ SOLN: 12.5000 mg | Freq: Four times a day (QID) | INTRAMUSCULAR | Status: DC | PRN

## 2011-11-16 MED ORDER — PROPOFOL 10 MG/ML IV BOLUS
INTRAVENOUS | Status: DC | PRN
  Administered 2011-11-16: 130 mg via INTRAVENOUS
  Administered 2011-11-16: 200 mg via INTRAVENOUS
  Administered 2011-11-16: 70 mg via INTRAVENOUS

## 2011-11-16 MED ORDER — ASPIRIN EC 325 MG PO TBEC
325.0000 mg | DELAYED_RELEASE_TABLET | Freq: Two times a day (BID) | ORAL | Status: DC
  Administered 2011-11-16 – 2011-11-18 (×4): 325 mg via ORAL
  Filled 2011-11-16 (×5): qty 1

## 2011-11-16 MED ORDER — DIAZEPAM 5 MG/ML IJ SOLN
INTRAMUSCULAR | Status: AC
  Administered 2011-11-16: 5 mg
  Filled 2011-11-16: qty 2

## 2011-11-16 MED ORDER — OXYCODONE HCL 5 MG/5ML PO SOLN: 5.0000 mg | Freq: Once | ORAL | Status: DC | PRN

## 2011-11-16 MED ORDER — OXYCODONE HCL 5 MG PO TABS: 5.0000 mg | ORAL_TABLET | Freq: Once | ORAL | Status: DC | PRN

## 2011-11-16 MED ORDER — LACTATED RINGERS IV SOLN
INTRAVENOUS | Status: DC | PRN
  Administered 2011-11-16 (×3): via INTRAVENOUS

## 2011-11-16 MED ORDER — HYDROMORPHONE 0.3 MG/ML IV SOLN
INTRAVENOUS | Status: DC
  Administered 2011-11-16 – 2011-11-17 (×2): via INTRAVENOUS
  Filled 2011-11-16 (×2): qty 25

## 2011-11-16 MED ORDER — DIPHENHYDRAMINE HCL 12.5 MG/5ML PO ELIX: 12.5000 mg | ORAL_SOLUTION | Freq: Four times a day (QID) | ORAL | Status: DC | PRN

## 2011-11-16 MED ORDER — HYDROMORPHONE HCL PF 1 MG/ML IJ SOLN
0.2500 mg | INTRAMUSCULAR | Status: AC | PRN
  Administered 2011-11-16 (×8): 0.5 mg via INTRAVENOUS

## 2011-11-16 MED ORDER — ONDANSETRON HCL 4 MG/2ML IJ SOLN
INTRAMUSCULAR | Status: DC | PRN
  Administered 2011-11-16: 4 mg via INTRAVENOUS

## 2011-11-16 MED ORDER — ACETAMINOPHEN 10 MG/ML IV SOLN
INTRAVENOUS | Status: AC
  Filled 2011-11-16: qty 100

## 2011-11-16 MED ORDER — HYDROMORPHONE 0.3 MG/ML IV SOLN
INTRAVENOUS | Status: AC
  Filled 2011-11-16: qty 25

## 2011-11-16 MED ORDER — PROMETHAZINE HCL 25 MG/ML IJ SOLN: 6.2500 mg | INTRAMUSCULAR | Status: DC | PRN

## 2011-11-16 MED ORDER — FLEET ENEMA 7-19 GM/118ML RE ENEM: 1.0000 | ENEMA | Freq: Once | RECTAL | Status: AC | PRN

## 2011-11-16 MED ORDER — HYDROMORPHONE 0.3 MG/ML IV SOLN
INTRAVENOUS | Status: DC
  Administered 2011-11-16: 4.8 mg via INTRAVENOUS
  Administered 2011-11-17: 4.7 mg via INTRAVENOUS
  Administered 2011-11-17: 4.6 mg via INTRAVENOUS
  Filled 2011-11-16: qty 25

## 2011-11-16 MED ORDER — METHOCARBAMOL 100 MG/ML IJ SOLN
500.0000 mg | Freq: Four times a day (QID) | INTRAVENOUS | Status: DC | PRN
  Filled 2011-11-16: qty 5

## 2011-11-16 MED ORDER — NALOXONE HCL 0.4 MG/ML IJ SOLN: 0.4000 mg | INTRAMUSCULAR | Status: DC | PRN

## 2011-11-16 MED ORDER — SUFENTANIL CITRATE 50 MCG/ML IV SOLN
INTRAVENOUS | Status: DC | PRN
  Administered 2011-11-16: 10 ug via INTRAVENOUS
  Administered 2011-11-16 (×2): 20 ug via INTRAVENOUS

## 2011-11-16 MED ORDER — ONDANSETRON HCL 4 MG PO TABS: 4.0000 mg | ORAL_TABLET | Freq: Four times a day (QID) | ORAL | Status: DC | PRN

## 2011-11-16 MED ORDER — SODIUM CHLORIDE 0.9 % IJ SOLN: 9.0000 mL | INTRAMUSCULAR | Status: DC | PRN

## 2011-11-16 MED ORDER — ACETAMINOPHEN 10 MG/ML IV SOLN
1000.0000 mg | Freq: Four times a day (QID) | INTRAVENOUS | Status: AC
  Administered 2011-11-16 – 2011-11-17 (×4): 1000 mg via INTRAVENOUS
  Filled 2011-11-16 (×4): qty 100

## 2011-11-16 MED ORDER — ACETAMINOPHEN 650 MG RE SUPP: 650.0000 mg | Freq: Four times a day (QID) | RECTAL | Status: DC | PRN

## 2011-11-16 MED ORDER — OXYCODONE HCL 5 MG PO TABS
5.0000 mg | ORAL_TABLET | ORAL | Status: DC | PRN
  Administered 2011-11-17 – 2011-11-18 (×5): 10 mg via ORAL
  Filled 2011-11-16 (×5): qty 2

## 2011-11-16 MED ORDER — PAROXETINE HCL 20 MG PO TABS
40.0000 mg | ORAL_TABLET | Freq: Every day | ORAL | Status: DC
  Administered 2011-11-17 – 2011-11-18 (×2): 40 mg via ORAL
  Filled 2011-11-16 (×2): qty 2

## 2011-11-16 MED ORDER — HYDROMORPHONE HCL PF 1 MG/ML IJ SOLN: 0.5000 mg | Freq: Four times a day (QID) | INTRAMUSCULAR | Status: DC | PRN

## 2011-11-16 MED ORDER — KCL IN DEXTROSE-NACL 20-5-0.45 MEQ/L-%-% IV SOLN
INTRAVENOUS | Status: DC
  Administered 2011-11-16 – 2011-11-17 (×4): via INTRAVENOUS
  Filled 2011-11-16 (×7): qty 1000

## 2011-11-16 MED ORDER — METHOCARBAMOL 500 MG PO TABS
500.0000 mg | ORAL_TABLET | Freq: Four times a day (QID) | ORAL | Status: DC | PRN
  Administered 2011-11-16 – 2011-11-18 (×4): 500 mg via ORAL
  Filled 2011-11-16 (×4): qty 1

## 2011-11-16 MED ORDER — ROCURONIUM BROMIDE 100 MG/10ML IV SOLN: INTRAVENOUS | Status: DC | PRN

## 2011-11-16 MED ORDER — ALUM & MAG HYDROXIDE-SIMETH 200-200-20 MG/5ML PO SUSP: 30.0000 mL | ORAL | Status: DC | PRN

## 2011-11-16 MED ORDER — LACTATED RINGERS IV SOLN
INTRAVENOUS | Status: DC
  Administered 2011-11-16: 12:00:00 via INTRAVENOUS

## 2011-11-16 MED ORDER — MORPHINE SULFATE 10 MG/ML IJ SOLN
INTRAMUSCULAR | Status: DC | PRN
  Administered 2011-11-16: 10 mg via INTRAVENOUS

## 2011-11-16 MED ORDER — CEFUROXIME SODIUM 1.5 G IJ SOLR
INTRAMUSCULAR | Status: AC
  Filled 2011-11-16: qty 1.5

## 2011-11-16 MED ORDER — GLYCOPYRROLATE 0.2 MG/ML IJ SOLN
INTRAMUSCULAR | Status: DC | PRN
  Administered 2011-11-16: 0.4 mg via INTRAVENOUS

## 2011-11-16 MED ORDER — CELECOXIB 200 MG PO CAPS
200.0000 mg | ORAL_CAPSULE | Freq: Two times a day (BID) | ORAL | Status: DC
  Administered 2011-11-16 – 2011-11-18 (×4): 200 mg via ORAL
  Filled 2011-11-16 (×5): qty 1

## 2011-11-16 MED ORDER — FENTANYL CITRATE 0.05 MG/ML IJ SOLN
INTRAMUSCULAR | Status: AC
  Filled 2011-11-16: qty 2

## 2011-11-16 MED ORDER — MIDAZOLAM HCL 2 MG/2ML IJ SOLN
INTRAMUSCULAR | Status: AC
  Filled 2011-11-16: qty 2

## 2011-11-16 MED ORDER — DEXAMETHASONE SODIUM PHOSPHATE 4 MG/ML IJ SOLN
INTRAMUSCULAR | Status: DC | PRN
  Administered 2011-11-16: 4 mg

## 2011-11-16 MED ORDER — SODIUM CHLORIDE 0.9 % IV SOLN
INTRAVENOUS | Status: DC | PRN
  Administered 2011-11-16: 14:00:00 via INTRAVENOUS

## 2011-11-16 MED ORDER — FENTANYL CITRATE 0.05 MG/ML IJ SOLN
INTRAMUSCULAR | Status: DC | PRN
  Administered 2011-11-16: 150 ug via INTRAVENOUS
  Administered 2011-11-16: 50 ug via INTRAVENOUS
  Administered 2011-11-16: 100 ug via INTRAVENOUS
  Administered 2011-11-16 (×2): 50 ug via INTRAVENOUS
  Administered 2011-11-16: 100 ug via INTRAVENOUS

## 2011-11-16 MED ORDER — FENTANYL CITRATE 0.05 MG/ML IJ SOLN
50.0000 ug | Freq: Once | INTRAMUSCULAR | Status: AC
  Administered 2011-11-16: 100 ug via INTRAVENOUS

## 2011-11-16 MED ORDER — MENTHOL 3 MG MT LOZG: 1.0000 | LOZENGE | OROMUCOSAL | Status: DC | PRN

## 2011-11-16 MED ORDER — KCL IN DEXTROSE-NACL 20-5-0.45 MEQ/L-%-% IV SOLN
INTRAVENOUS | Status: AC
  Filled 2011-11-16: qty 1000

## 2011-11-16 MED ORDER — SUCCINYLCHOLINE CHLORIDE 20 MG/ML IJ SOLN
INTRAMUSCULAR | Status: DC | PRN
  Administered 2011-11-16: 160 mg via INTRAVENOUS

## 2011-11-16 MED ORDER — VECURONIUM BROMIDE 10 MG IV SOLR
INTRAVENOUS | Status: DC | PRN
  Administered 2011-11-16: 3 mg via INTRAVENOUS
  Administered 2011-11-16: 2 mg via INTRAVENOUS
  Administered 2011-11-16: 3 mg via INTRAVENOUS
  Administered 2011-11-16: 5 mg via INTRAVENOUS

## 2011-11-16 MED ORDER — HYDROMORPHONE HCL PF 1 MG/ML IJ SOLN: 1.0000 mg | INTRAMUSCULAR | Status: DC | PRN

## 2011-11-16 MED ORDER — MIDAZOLAM HCL 2 MG/2ML IJ SOLN
1.0000 mg | INTRAMUSCULAR | Status: DC | PRN
  Administered 2011-11-16: 2 mg via INTRAVENOUS

## 2011-11-16 MED ORDER — BISACODYL 5 MG PO TBEC: 5.0000 mg | DELAYED_RELEASE_TABLET | Freq: Every day | ORAL | Status: DC | PRN

## 2011-11-16 MED ORDER — SODIUM CHLORIDE 0.9 % IR SOLN
Status: DC | PRN
  Administered 2011-11-16: 1000 mL

## 2011-11-16 MED ORDER — DEXTROSE-NACL 5-0.45 % IV SOLN: INTRAVENOUS | Status: DC

## 2011-11-16 MED ORDER — HYDROMORPHONE HCL PF 1 MG/ML IJ SOLN
INTRAMUSCULAR | Status: DC | PRN
  Administered 2011-11-16 (×2): 0.5 mg via INTRAVENOUS

## 2011-11-16 MED ORDER — DIAZEPAM 5 MG/ML IJ SOLN
10.0000 mg | Freq: Once | INTRAMUSCULAR | Status: AC
  Administered 2011-11-16: 5 mg via INTRAVENOUS

## 2011-11-16 MED ORDER — CEFUROXIME SODIUM 1.5 G IJ SOLR
INTRAMUSCULAR | Status: DC | PRN
  Administered 2011-11-16: 1.5 g

## 2011-11-16 MED ORDER — ZOLPIDEM TARTRATE 5 MG PO TABS: 5.0000 mg | ORAL_TABLET | Freq: Every evening | ORAL | Status: DC | PRN

## 2011-11-16 SURGICAL SUPPLY — 53 items
BANDAGE ESMARK 6X9 LF (GAUZE/BANDAGES/DRESSINGS) ×1 IMPLANT
BLADE SAG 18X100X1.27 (BLADE) ×2 IMPLANT
BLADE SAW SGTL 13X75X1.27 (BLADE) ×2 IMPLANT
BLADE SURG ROTATE 9660 (MISCELLANEOUS) IMPLANT
BNDG ELASTIC 6X10 VLCR STRL LF (GAUZE/BANDAGES/DRESSINGS) IMPLANT
BNDG ESMARK 6X9 LF (GAUZE/BANDAGES/DRESSINGS) ×2
BOWL SMART MIX CTS (DISPOSABLE) ×2 IMPLANT
CEMENT HV SMART SET (Cement) ×4 IMPLANT
CLOTH BEACON ORANGE TIMEOUT ST (SAFETY) ×2 IMPLANT
COVER BACK TABLE 24X17X13 BIG (DRAPES) IMPLANT
COVER SURGICAL LIGHT HANDLE (MISCELLANEOUS) ×2 IMPLANT
CUFF TOURNIQUET SINGLE 34IN LL (TOURNIQUET CUFF) ×2 IMPLANT
CUFF TOURNIQUET SINGLE 44IN (TOURNIQUET CUFF) IMPLANT
DRAPE EXTREMITY T 121X128X90 (DRAPE) ×2 IMPLANT
DRAPE U-SHAPE 47X51 STRL (DRAPES) ×2 IMPLANT
DURAPREP 26ML APPLICATOR (WOUND CARE) ×2 IMPLANT
ELECT REM PT RETURN 9FT ADLT (ELECTROSURGICAL) ×2
ELECTRODE REM PT RTRN 9FT ADLT (ELECTROSURGICAL) ×1 IMPLANT
EVACUATOR 1/8 PVC DRAIN (DRAIN) ×2 IMPLANT
GAUZE XEROFORM 1X8 LF (GAUZE/BANDAGES/DRESSINGS) IMPLANT
GLOVE BIO SURGEON STRL SZ7 (GLOVE) ×2 IMPLANT
GLOVE BIO SURGEON STRL SZ7.5 (GLOVE) ×2 IMPLANT
GLOVE BIOGEL PI IND STRL 7.0 (GLOVE) ×3 IMPLANT
GLOVE BIOGEL PI IND STRL 8 (GLOVE) ×1 IMPLANT
GLOVE BIOGEL PI INDICATOR 7.0 (GLOVE) ×3
GLOVE BIOGEL PI INDICATOR 8 (GLOVE) ×1
GLOVE SURG SS PI 6.5 STRL IVOR (GLOVE) ×6 IMPLANT
GOWN PREVENTION PLUS XLARGE (GOWN DISPOSABLE) ×4 IMPLANT
GOWN STRL NON-REIN LRG LVL3 (GOWN DISPOSABLE) ×2 IMPLANT
GOWN STRL REIN XL XLG (GOWN DISPOSABLE) ×2 IMPLANT
HANDPIECE INTERPULSE COAX TIP (DISPOSABLE) ×1
HOOD PEEL AWAY FACE SHEILD DIS (HOOD) ×4 IMPLANT
KIT BASIN OR (CUSTOM PROCEDURE TRAY) ×2 IMPLANT
KIT ROOM TURNOVER OR (KITS) ×2 IMPLANT
MANIFOLD NEPTUNE II (INSTRUMENTS) ×2 IMPLANT
NS IRRIG 1000ML POUR BTL (IV SOLUTION) ×2 IMPLANT
PACK TOTAL JOINT (CUSTOM PROCEDURE TRAY) ×2 IMPLANT
PAD ARMBOARD 7.5X6 YLW CONV (MISCELLANEOUS) ×4 IMPLANT
PADDING CAST COTTON 6X4 STRL (CAST SUPPLIES) IMPLANT
SET HNDPC FAN SPRY TIP SCT (DISPOSABLE) ×1 IMPLANT
SPONGE GAUZE 4X4 12PLY (GAUZE/BANDAGES/DRESSINGS) IMPLANT
STAPLER VISISTAT 35W (STAPLE) ×2 IMPLANT
SUCTION FRAZIER TIP 10 FR DISP (SUCTIONS) ×2 IMPLANT
SUT VIC AB 0 CTX 36 (SUTURE) ×1
SUT VIC AB 0 CTX36XBRD ANTBCTR (SUTURE) ×1 IMPLANT
SUT VIC AB 1 CTX 36 (SUTURE) ×1
SUT VIC AB 1 CTX36XBRD ANBCTR (SUTURE) ×1 IMPLANT
SUT VIC AB 2-0 CT1 27 (SUTURE) ×2
SUT VIC AB 2-0 CT1 TAPERPNT 27 (SUTURE) ×2 IMPLANT
TOWEL OR 17X24 6PK STRL BLUE (TOWEL DISPOSABLE) ×2 IMPLANT
TOWEL OR 17X26 10 PK STRL BLUE (TOWEL DISPOSABLE) ×2 IMPLANT
TRAY FOLEY CATH 14FR (SET/KITS/TRAYS/PACK) ×2 IMPLANT
WATER STERILE IRR 1000ML POUR (IV SOLUTION) ×4 IMPLANT

## 2011-11-16 NOTE — Preoperative (Signed)
Beta Blockers   Reason not to administer Beta Blockers:Not Applicable 

## 2011-11-16 NOTE — Transfer of Care (Signed)
Immediate Anesthesia Transfer of Care Note  Patient: Jimmy Black  Procedure(s) Performed: Procedure(s) (LRB) with comments: TOTAL KNEE ARTHROPLASTY (Right) - right total knee arthroplasty  Patient Location: PACU  Anesthesia Type: General  Level of Consciousness: awake, alert  and oriented  Airway & Oxygen Therapy: Patient Spontanous Breathing and Patient connected to nasal cannula oxygen  Post-op Assessment: Report given to PACU RN, Post -op Vital signs reviewed and stable and Patient moving all extremities  Post vital signs: Reviewed and stable  Complications: No apparent anesthesia complications

## 2011-11-16 NOTE — Interval H&P Note (Signed)
History and Physical Interval Note:  11/16/2011 12:43 PM  Jimmy Black  has presented today for surgery, with the diagnosis of RIGHT KNEE DEGENERATIVE JOINT DISEASE  The various methods of treatment have been discussed with the patient and family. After consideration of risks, benefits and other options for treatment, the patient has consented to  Procedure(s) (LRB) with comments: TOTAL KNEE ARTHROPLASTY (Right) as a surgical intervention .  The patient's history has been reviewed, patient examined, no change in status, stable for surgery.  I have reviewed the patient's chart and labs.  Questions were answered to the patient's satisfaction.     Nestor Lewandowsky

## 2011-11-16 NOTE — Progress Notes (Signed)
Orthopedic Tech Progress Note Patient Details:  Gorman Safi [facility] 10-27-1960 [id number]  [name] [name]: On Right Knee Flexion (Degrees): 60  Right Knee Extension (Degrees): 0  Additional Comments: trapeze bar patient helper   Nikki Dom 11/16/2011, 6:59 PM

## 2011-11-16 NOTE — Op Note (Signed)
PATIENT ID:      Jimmy Black  MRN:     [id number] DOB/AGE:    August 08, 1960 / 51 y.o.       OPERATIVE REPORT    DATE OF PROCEDURE:  11/16/2011       PREOPERATIVE DIAGNOSIS:   RIGHT KNEE DEGENERATIVE JOINT DISEASE      Estimated Body mass index is 29.58 kg/(m^2) as calculated from the following:   Height as of 08/17/11: 6\' 6" (1.981 m).   Weight as of 08/17/11: 256 lb(116.121 kg).                                                        POSTOPERATIVE DIAGNOSIS:   RIGHT KNEE DEGENERATIVE JOINT DISEASE                                                                      PROCEDURE:  Procedure(s): TOTAL KNEE ARTHROPLASTY Using Depuy Sigma RP implants #6R Femur, #6Tibia, 10mm sigma RP bearing, 41 Patella     SURGEON: Husein Guedes J    ASSISTANT:   Shirl Harris PA-C   (Present and scrubbed throughout the case, critical for assistance with exposure, retraction, instrumentation, and closure.)         ANESTHESIA: GET with Femoral Nerve Block  DRAINS: foley, 2 medium hemovac in knee   TOURNIQUET TIME:   COMPLICATIONS:  None     SPECIMENS: None   INDICATIONS FOR PROCEDURE: The patient has  RIGHT KNEE DEGENERATIVE JOINT DISEASE, varus deformities, XR shows bone on bone arthritis. Patient has failed all conservative measures including anti-inflammatory medicines, narcotics, attempts at  exercise and weight loss, cortisone injections and viscosupplementation.  Risks and benefits of surgery have been discussed, questions answered.   DESCRIPTION OF PROCEDURE: The patient identified by armband, received  right femoral nerve block and IV antibiotics, in the holding area at [facility]. Patient taken to the operating room, appropriate anesthetic  monitors were attached General endotracheal anesthesia induced with  the patient in supine position, Foley catheter was inserted. Tourniquet  applied high to the operative thigh. Lateral post and foot positioner  applied to the table, the  lower extremity was then prepped and draped  in usual sterile fashion from the ankle to the tourniquet. Time-out procedure was performed. The limb was wrapped with an Esmarch bandage and the tourniquet inflated to 350 mmHg. We began the operation by making the anterior midline incision starting at handbreadth above the patella going over the patella 1 cm medial to and  4 cm distal to the tibial tubercle. Small bleeders in the skin and the  subcutaneous tissue identified and cauterized. Transverse retinaculum was incised and reflected medially and a medial parapatellar arthrotomy was accomplished. the patella was everted and theprepatellar fat pad resected. The superficial medial collateral  ligament was then elevated from anterior to posterior along the proximal  flare of the tibia and anterior half of the menisci resected. The knee was hyperflexed exposing bone on bone arthritis. Large 1-1-1/2 cm Peripheral and notch osteophytes as well as the cruciate ligaments were  then resected. A three-quarter inch osteotome had to be used to expose the notch which was completely grown over with bone. We continued to  work our way around posteriorly along the proximal tibia, and externally  rotated the tibia subluxing it out from underneath the femur. A McHale  retractor was placed through the notch and a lateral Hohmann retractor  placed, and we then drilled through the proximal tibia in line with the  axis of the tibia followed by an intramedullary guide rod and 2-degree  posterior slope cutting guide. The tibial cutting guide was pinned into place  allowing resection of 2 mm of bone medially and about 10 mm of bone  laterally because of her varus deformity. Satisfied with the tibial resection, we then  entered the distal femur 2 mm anterior to the PCL origin with the  intramedullary guide rod and applied the distal femoral cutting guide  set at 11mm, with 5 degrees of valgus. This was pinned along the    epicondylar axis. At this point, the distal femoral cut was accomplished without difficulty. We then sized for a #6R femoral component and pinned the guide in 3 degrees of external rotation.The chamfer cutting guide was pinned into place. The anterior, posterior, and chamfer cuts were accomplished without difficulty followed by  the Sigma RP box cutting guide and the box cut. We also removed posterior osteophytes from the posterior femoral condyles. At this  time, the knee was brought into full extension. We checked our  extension and flexion gaps and found them symmetric at 10mm.  The patella thickness measured at 28 mm. We set the cutting guide at 18 and removed the posterior 10 mm  of the patella sized for 41 button and drilled the lollipop. The knee  was then once again hyperflexed exposing the proximal tibia. We sized for a #6 tibial base plate, applied the smokestack and the conical reamer followed by the the Delta fin keel punch. We then hammered into place the Sigma RP trial femoral component, inserted a 10-mm trial bearing, trial patellar button, and took the knee through range of motion from 0-130 degrees. No thumb pressure was required for patellar  tracking. At this point, all trial components were removed, a double batch of DePuy HV cement with 1500 mg of Zinacef was mixed and applied to all bony metallic mating surfaces except for the posterior condyles of the femur itself. In order, we  hammered into place the tibial tray and removed excess cement, the femoral component and removed excess cement, a 10-mm Sigma RP bearing  was inserted, and the knee brought to full extension with compression.  The patellar button was clamped into place, and excess cement  removed. While the cement cured the wound was irrigated out with normal saline solution pulse lavage, and medium Hemovac drains were placed from an anterolateral  approach. Ligament stability and patellar tracking were checked and found  to be excellent. The parapatellar arthrotomy was closed with  running #1 Vicryl suture. The subcutaneous tissue with 0 and 2-0 undyed  Vicryl suture, and the skin with skin staples. A dressing of Xeroform,  4 x 4, dressing sponges, Webril, and Ace wrap applied. The patient  awakened, extubated, and taken to recovery room without difficulty.   Gean Birchwood J 11/16/2011, 3:30 PM

## 2011-11-16 NOTE — Anesthesia Preprocedure Evaluation (Signed)
Anesthesia Evaluation  Patient identified by MRN, date of birth, ID band Patient awake    Reviewed: Allergy & Precautions, H&P , NPO status , Patient's Chart, lab work & pertinent test results  Airway Mallampati: I TM Distance: >3 FB Neck ROM: Full    Dental   Pulmonary  breath sounds clear to auscultation        Cardiovascular Rhythm:Regular Rate:Normal     Neuro/Psych Anxiety    GI/Hepatic   Endo/Other    Renal/GU      Musculoskeletal   Abdominal (+) + obese,   Peds  Hematology   Anesthesia Other Findings   Reproductive/Obstetrics                           Anesthesia Physical Anesthesia Plan  ASA: II  Anesthesia Plan: General   Post-op Pain Management:    Induction: Intravenous  Airway Management Planned: Oral ETT  Additional Equipment:   Intra-op Plan:   Post-operative Plan: Extubation in OR  Informed Consent: I have reviewed the patients History and Physical, chart, labs and discussed the procedure including the risks, benefits and alternatives for the proposed anesthesia with the patient or authorized representative who has indicated his/her understanding and acceptance.     Plan Discussed with: CRNA and Surgeon  Anesthesia Plan Comments:         Anesthesia Quick Evaluation

## 2011-11-16 NOTE — Progress Notes (Signed)
Orthopedic Tech Progress Note Patient Details:  [name] [facility] [birth year] [id number]  Patient ID: Jimmy Black, male   DOB: 1960/05/11, 51 y.o.   MRN: [id number] Viewed order from rn order list  Nikki Dom 11/16/2011, 6:59 PM

## 2011-11-16 NOTE — Progress Notes (Signed)
Orthopedic Tech Progress Note Patient Details:  [name] [facility] 09-06-1960 [id number]  Patient ID: Jimmy Black, male   DOB: [birth year], 51 y.o.   MRN: [id number] cpm on @1850   Nikki Dom 11/16/2011, 6:59 PM

## 2011-11-16 NOTE — Anesthesia Procedure Notes (Addendum)
Anesthesia Regional Block:  Femoral nerve block  Pre-Anesthetic Checklist: ,, timeout performed, Correct Patient, Correct Site, Correct Laterality, Correct Procedure, Correct Position, site marked, Risks and benefits discussed,  Surgical consent,  Pre-op evaluation,  At surgeon's request and post-op pain management  Laterality: Right  Prep: chloraprep       Needles:  Injection technique: Single-shot  Needle Type: Stimulator Needle - 40      Needle Gauge: 22 and 22 G    Additional Needles:  Procedures: nerve stimulator Femoral nerve block  Nerve Stimulator or Paresthesia:  Response: 0.48 mA,   Additional Responses:   Narrative:  Start time: 11/16/2011 12:02 PM End time: 11/16/2011 12:14 PM Injection made incrementally with aspirations every 5 mL. Anesthesiologist: Dr Gypsy Balsam  Additional Notes: 6962-9528 R FNB POP CHG prep, sterile tech #22 stim needle w/stim down to .48ma Multiple neg asp Marc .5% w/epi 30cc+decadron 4mg  infiltrated No compl Dr Gypsy Balsam   Procedure Name: Intubation Date/Time: 11/16/2011 1:37 PM Performed by: Wray Kearns A Pre-anesthesia Checklist: Patient identified, Timeout performed, Emergency Drugs available, Suction available and Patient being monitored Patient Re-evaluated:Patient Re-evaluated prior to inductionOxygen Delivery Method: Circle system utilized Preoxygenation: Pre-oxygenation with 100% oxygen Intubation Type: IV induction and Cricoid Pressure applied Ventilation: Mask ventilation without difficulty Laryngoscope Size: Mac and 4 Grade View: Grade I Tube type: Oral Tube size: 8.5 mm Number of attempts: 1 Airway Equipment and Method: Stylet Placement Confirmation: ETT inserted through vocal cords under direct vision,  breath sounds checked- equal and bilateral and positive ETCO2 Secured at: 24 cm Tube secured with: Tape Dental Injury: Teeth and Oropharynx as per pre-operative assessment

## 2011-11-17 ENCOUNTER — Encounter (HOSPITAL_COMMUNITY): Payer: Self-pay | Admitting: Orthopedic Surgery

## 2011-11-17 LAB — GLUCOSE, CAPILLARY

## 2011-11-17 LAB — BASIC METABOLIC PANEL
Calcium: 9.7 mg/dL (ref 8.4–10.5)
GFR calc Af Amer: >90 mL/min (ref >90)
GFR calc non Af Amer: >90 mL/min (ref >90)
Sodium: 132 mEq/L — ABNORMAL LOW (ref 135–145)

## 2011-11-17 LAB — CBC
HCT: 32.2 % — ABNORMAL LOW (ref 39.0–52.0)
MCHC: 35.4 g/dL (ref 30.0–36.0)
MCV: 87.5 fL (ref 78.0–100.0)
Platelets: 247 10*3/uL (ref 150–400)
RDW: 13 % (ref 11.5–15.5)
WBC: 21.2 10*3/uL — ABNORMAL HIGH (ref 4.0–10.5)

## 2011-11-17 NOTE — Progress Notes (Signed)
Physical Therapy Evaluation Patient Details Name: Jimmy Black MRN: [id number] DOB: 03-29-1960 Today's Date: 11/17/2011 Time: 7829-5621 PT Time Calculation (min): 34 min  PT Assessment / Plan / Recommendation Clinical Impression  Pt is a 51 y.o. M s/p R TKA.  Pt c/o pain and dizziness during ambulation.  Pt will benefit from acute physical therapy services to increase safety, independence, balance, and endurance.    PT Assessment  Patient needs continued PT services    Follow Up Recommendations  Home health PT    Barriers to Discharge None      Equipment Recommendations  Rolling walker with 5" wheels;3 in 1 bedside comode    Recommendations for Other Services Other (comment) (None)   Frequency 7X/week    Precautions / Restrictions Precautions Precautions: Knee Restrictions Weight Bearing Restrictions: Yes RLE Weight Bearing: Weight bearing as tolerated   Pertinent Vitals/Pain Pt reports pain at 7/10 in R LE.  At the end of therapy session pain 8/10 in R LE.      Mobility  Bed Mobility Bed Mobility: Supine to Sit;Sitting - Scoot to Edge of Bed Supine to Sit: 4: Min assist;HOB elevated Sitting - Scoot to [place] of Bed: 4: Min assist Details for Bed Mobility Assistance: (A) to lift and support R LE.  Cues for hand placement and proper technique Transfers Transfers: Sit to Stand;Stand to Sit Sit to Stand: 4: Min assist;With upper extremity assist;From bed Stand to Sit: 4: Min assist;With upper extremity assist;To chair/3-in-1 Details for Transfer Assistance: (A) with initiating mvt and slowing descent.  Cues for hand placement and proper technique Ambulation/Gait Ambulation/Gait Assistance: 4: Min assist Ambulation Distance (Feet): 5 Feet Assistive device: Rolling walker Ambulation/Gait Assistance Details: (A) with RW placement and balance.  Cues for posture, safety, proper techniqueand balance Gait Pattern: Step-to pattern;Decreased stride length;Antalgic Gait  velocity: decreased Stairs: No Wheelchair Mobility Wheelchair Mobility: No    Shoulder Instructions     Exercises Total Joint Exercises Ankle Circles/Pumps: AROM;Both;10 reps Quad Sets: AROM;Both;5 reps Heel Slides: AAROM;Right;5 reps   PT Diagnosis: Difficulty walking;Abnormality of gait;Generalized weakness;Acute pain  PT Problem List: Decreased strength;Decreased range of motion;Decreased activity tolerance;Decreased balance;Decreased mobility;Pain PT Treatment Interventions: Gait training;DME instruction;Stair training;Functional mobility training;Therapeutic activities;Therapeutic exercise;Balance training;Neuromuscular re-education;Patient/family education   PT Goals Acute Rehab PT Goals PT Goal Formulation: With patient Time For Goal Achievement: 11/17/11 Potential to Achieve Goals: Good Pt will go Supine/Side to Sit: with modified independence;with HOB 0 degrees PT Goal: Supine/Side to Sit - Progress: Goal set today Pt will go Sit to Supine/Side: with modified independence;with HOB 0 degrees PT Goal: Sit to Supine/Side - Progress: Goal set today Pt will go Sit to Stand: with modified independence;with upper extremity assist PT Goal: Sit to Stand - Progress: Goal set today Pt will go Stand to Sit: with modified independence;with upper extremity assist PT Goal: Stand to Sit - Progress: Goal set today Pt will Ambulate: >150 feet;with supervision;with least restrictive assistive device PT Goal: Ambulate - Progress: Goal set today Pt will Go Up / Down Stairs: 3-5 stairs;with min assist;with least restrictive assistive device PT Goal: Up/Down Stairs - Progress: Goal set today Pt will Perform Home Exercise Program: Independently PT Goal: Perform Home Exercise Program - Progress: Goal set today  Visit Information  Last PT Received On: 11/17/11 Assistance Needed: +1    Subjective Data  Subjective: In a lot of pain Patient Stated Goal: To go home   Prior Functioning  Home  Living Lives With: Alone;Daughter (part time usually  weekends) Available Help at Discharge: Friend(s);Family;Available 24 hours/day Type of Home: House (town home) Home Access: Stairs to enter [employer] of Steps: 3 Entrance Stairs-Rails: None Home Layout: Two level;Other (Comment) (bedroom on first floor) Alternate Level Stairs-Number of Steps: 10 Alternate Level Stairs-Rails: Left Bathroom Shower/Tub: Tub/shower unit;Curtain Bathroom Toilet: Standard Bathroom Accessibility: Yes How Accessible: Accessible via walker Home Adaptive Equipment: None Prior Function Level of Independence: Independent Able to Take Stairs?: Yes Driving: Yes Vocation: Other (comment) ([employer] on hiatus) Dominant Hand: Right    Cognition  Overall Cognitive Status: Appears within functional limits for tasks assessed/performed Arousal/Alertness: Awake/alert Orientation Level: Appears intact for tasks assessed Behavior During Session: [facility] for tasks performed    Extremity/Trunk Assessment     Balance    End of Session PT - End of Session Equipment Utilized During Treatment: Gait belt Activity Tolerance: Patient limited by pain Patient left: in chair;with call bell/phone within reach Nurse Communication: Mobility status [name] [name]: Off  GP     Toluwani Yadav 11/17/2011, 11:05 AM

## 2011-11-17 NOTE — Progress Notes (Signed)
Patient ID: ZACKAREY HOLLEMAN, male   DOB: [birth year], 51 y.o.   MRN: [id number] PATIENT ID: JARAMIE BASTOS  MRN: [id number]  DOB/AGE:  11-06-1960 / 51 y.o.  1 Day Post-Op Procedure(s) (LRB): TOTAL KNEE ARTHROPLASTY (Right)    PROGRESS NOTE Subjective: Patient is alert, oriented, no Nausea, no Vomiting, yes passing gas, no Bowel Movement. Taking PO well. Denies SOB, Chest or Calf Pain. Using Incentive Spirometer, PAS in place. Ambulate WBAT, CPM 0-30 Patient reports pain as 3 on 0-10 scale  .    Objective: Vital signs in last 24 hours: Filed Vitals:   11/17/11 0200 11/17/11 0438 11/17/11 0440 11/17/11 0600  BP: 140/100   136/94  Pulse: 100   84  Temp: 98.4 F (36.9 C)   98.4 F (36.9 C)  TempSrc:      Resp: 18 18 17 16   SpO2: 97% 95% 97% 97%      Intake/Output from previous day: I/O last 3 completed shifts: In: 5460 [P.O.:860; I.V.:4000; Other:600] Out: 4225 [Urine:4000; Drains:150; Blood:75]   Intake/Output this shift:     LABORATORY DATA:  Basename 11/17/11 0640 11/17/11 0636 11/17/11 0455  WBC -- -- 21.2*  HGB -- -- 11.4*  HCT -- -- 32.2*  PLT -- -- 247  NA 132* -- --  K 4.8 -- --  CL 95* -- --  CO2 24 -- --  BUN 15 -- --  CREATININE 0.69 -- --  GLUCOSE 153* -- --  GLUCAP -- 151* --  INR -- -- --  CALCIUM 9.7 -- --    Examination: Neurologically intact ABD soft Neurovascular intact Sensation intact distally Intact pulses distally Dorsiflexion/Plantar flexion intact Incision: scant drainage No cellulitis present Compartment soft} Blood and plasma separated in drain indicating minimal recent drainage, drain pulled without difficulty.  Assessment:   1 Day Post-Op Procedure(s) (LRB): TOTAL KNEE ARTHROPLASTY (Right) ADDITIONAL DIAGNOSIS:    Plan: PT/OT WBAT, CPM 5/hrs day until ROM 0-90 degrees, then D/C CPM DVT Prophylaxis:  SCDx72hrs, ASA 325 mg BID x 2 weeks DISCHARGE PLAN: Home DISCHARGE NEEDS: HHPT, HHRN, CPM, Walker and 3-in-1  comode seat     Allyce Bochicchio J 11/17/2011, 9:07 AM

## 2011-11-17 NOTE — Progress Notes (Signed)
UR COMPLETED  

## 2011-11-17 NOTE — Progress Notes (Signed)
1230 Pt wanted pca off wanted to try po pain meds.

## 2011-11-17 NOTE — Progress Notes (Signed)
Physical Therapy Progress Note   11/17/11 1400  PT Visit Information  Last PT Received On 11/17/11  Assistance Needed +1  PT Time Calculation  PT Start Time 1326  PT Stop Time 1357  PT Time Calculation (min) 31 min  Subjective Data  Subjective Feeling better this afternoon  Patient Stated Goal To go home  Precautions  Precautions Knee  Restrictions  Weight Bearing Restrictions Yes  RLE Weight Bearing WBAT  Cognition  Overall Cognitive Status Appears within functional limits for tasks assessed/performed  Arousal/Alertness Awake/alert  Orientation Level Appears intact for tasks assessed  Behavior During Session [facility] for tasks performed  Bed Mobility  Bed Mobility Sit to Supine  Sit to Supine 4: Min guard;HOB flat  Details for Bed Mobility Assistance Cues for proper technique and safety  Transfers  Transfers Sit to Stand;Stand to Sit  Sit to Stand 4: Min guard;With upper extremity assist;From chair/3-in-1  Stand to Sit 4: Min guard;To bed;To chair/3-in-1  Details for Transfer Assistance Cues for proper technique and safety  Ambulation/Gait  Ambulation/Gait Assistance 4: Min guard  Ambulation Distance (Feet) 150 Feet  Assistive device Rolling walker  Ambulation/Gait Assistance Details Cues for proper technique, posture, and safety  Gait Pattern Step-to pattern;Decreased stride length;Antalgic  Gait velocity decreased  Stairs No  Wheelchair Mobility  Wheelchair Mobility No  Exercises  Exercises Total Joint  Total Joint Exercises  Quad Sets AROM;Right;5 reps  Heel Slides AAROM;Right;5 reps  PT - End of Session  Equipment Utilized During Treatment Gait belt  Activity Tolerance Patient tolerated treatment well  Patient left in bed;with call bell/phone within reach;with family/visitor present  Nurse Communication Mobility status  PT - Assessment/Plan  Comments on Treatment Session Pt was not feeling as dizzy this afternoon and was able to increase distance for ambulation.   Pt request talking to nurse about changing pain meds.  Plan for next session is to negotiate stairs.  PT Plan Discharge plan remains appropriate;Frequency remains appropriate  PT Frequency 7X/week  Recommendations for Other Services Other (comment) (None)  Follow Up Recommendations Home health PT  Equipment Recommended Rolling walker with 5" wheels;3 in 1 bedside comode  Acute Rehab PT Goals  PT Goal Formulation With patient  Time For Goal Achievement 11/17/11  Potential to Achieve Goals Good  Pt will go Sit to Supine/Side with modified independence;with HOB 0 degrees  PT Goal: Sit to Supine/Side - Progress Progressing toward goal  Pt will go Sit to Stand with modified independence;with upper extremity assist  PT Goal: Sit to Stand - Progress Progressing toward goal  Pt will go Stand to Sit with modified independence;with upper extremity assist  PT Goal: Stand to Sit - Progress Progressing toward goal  Pt will Ambulate >150 feet;with supervision;with least restrictive assistive device  PT Goal: Ambulate - Progress Progressing toward goal  Pt will Perform Home Exercise Program Independently  PT Goal: Perform Home Exercise Program - Progress Progressing toward goal    Prior to starting therapy pt pain in R LE 8/10.  After exercises pain in R LE was 7/10 and after ambulation pain in R LE was 6/10.  Khelani Kops, SPT

## 2011-11-17 NOTE — Progress Notes (Signed)
Agree with PT treatment note.  Estelene Carmack, PT DPT [phone]  

## 2011-11-17 NOTE — Anesthesia Postprocedure Evaluation (Signed)
  Anesthesia Post-op Note  Patient: Jimmy Black  Procedure(s) Performed: Procedure(s) (LRB) with comments: TOTAL KNEE ARTHROPLASTY (Right) - right total knee arthroplasty  Patient Location: Nursing Unit  Anesthesia Type: General  Level of Consciousness: awake, alert  and oriented  Airway and Oxygen Therapy: Patient Spontanous Breathing  Post-op Pain: mild  Post-op Assessment: Post-op Vital signs reviewed, Patient's Cardiovascular Status Stable and Respiratory Function Stable  Post-op Vital Signs: Reviewed and stable  Complications: No apparent anesthesia complications

## 2011-11-17 NOTE — Progress Notes (Signed)
Occupational Therapy Evaluation Patient Details Name: Jimmy Black MRN: [id number] DOB: 06/17/1960 Today's Date: 11/17/2011 Time: 4098-1191 OT Time Calculation (min): 30 min  OT Assessment / Plan / Recommendation Clinical Impression  51 yo s/p R TKA. Pt making steady progress. Completed all education regarding ADL and functional mobility for ADL. Pt verbalized understanding. No further OT needs.    OT Assessment  Patient does not need any further OT services    Follow Up Recommendations  No OT follow up    Barriers to Discharge  none    Equipment Recommendations  Rolling walker with 5" wheels;3 in 1 bedside comode    Recommendations for Other Services  none  Frequency    eval only   Precautions / Restrictions Precautions Precautions: Knee Restrictions Weight Bearing Restrictions: Yes RLE Weight Bearing: Weight bearing as tolerated   Pertinent Vitals/Pain 5. pain meds given    ADL  ADL Comments: Demonstrated how to use 3 in 1 for tub transfer with use of RW. Also educated on use of AE for LB ADL.     OT Diagnosis:    OT Problem List:   OT Treatment Interventions:     OT Goals Acute Rehab OT Goals OT Goal Formulation:  (eval only)  Visit Information  Last OT Received On: 11/17/11 Assistance Needed: +1    Subjective Data      Prior Functioning     Home Living Lives With: Alone;Daughter (part time usually weekends) Available Help at Discharge: Friend(s);Family;Available 24 hours/day Type of Home: House (town home) Home Access: Stairs to enter [employer] of Steps: 3 Entrance Stairs-Rails: None Home Layout: Two level;Other (Comment) (bedroom on first floor) Alternate Level Stairs-Number of Steps: 10 Alternate Level Stairs-Rails: Left Bathroom Shower/Tub: Tub/shower unit;Curtain Bathroom Toilet: Standard Bathroom Accessibility: Yes How Accessible: Accessible via walker Home Adaptive Equipment: None Prior Function Level of  Independence: Independent Able to Take Stairs?: Yes Driving: Yes Vocation: Other (comment) ([employer] on hiatus) Dominant Hand: Right         Vision/Perception     Cognition  Overall Cognitive Status: Appears within functional limits for tasks assessed/performed Arousal/Alertness: Awake/alert Orientation Level: Appears intact for tasks assessed Behavior During Session: [facility] for tasks performed    Extremity/Trunk Assessment Right Upper Extremity Assessment RUE ROM/Strength/Tone: Within functional levels Left Upper Extremity Assessment LUE ROM/Strength/Tone: Within functional levels Trunk Assessment Trunk Assessment: Normal     Mobility Bed Mobility Bed Mobility: Sit to Supine Sit to Supine: 4: Min guard;HOB flat Details for Bed Mobility Assistance: Cues for proper technique and safety Transfers Sit to Stand: 4: Min guard;With upper extremity assist;From chair/3-in-1 Stand to Sit: 4: Min guard;To bed;To chair/3-in-1 Details for Transfer Assistance: Cues for proper technique and safety     Shoulder Instructions     Exercise   Balance     End of Session OT - End of Session Activity Tolerance: Patient tolerated treatment well Patient left: in bed;with call bell/phone within reach Nurse Communication: Mobility status  GO     Shandell Giovanni,HILLARY 11/17/2011, 3:09 PM [facility], OTR/L  [phone] 11/17/2011

## 2011-11-17 NOTE — Progress Notes (Signed)
Referral received for SNF. Chart reviewed and CSW has spoken with RNCM who indicates that patient is for DC to home with Home Health and DME.  CSW to sign off. Please re-consult if CSW needs arise.  Amarilis Belflower T. Raymondo Garcialopez, LCSWA  [phone]  

## 2011-11-17 NOTE — Progress Notes (Signed)
Agree with PT evaluation.  Jones Viviani, PT DPT [phone]  

## 2011-11-18 LAB — CBC
MCH: 30.2 pg (ref 26.0–34.0)
Platelets: 196 10*3/uL (ref 150–400)
RBC: 3.21 MIL/uL — ABNORMAL LOW (ref 4.22–5.81)
WBC: 13.4 10*3/uL — ABNORMAL HIGH (ref 4.0–10.5)

## 2011-11-18 MED ORDER — HYDROMORPHONE HCL 2 MG PO TABS: 2.0000 mg | ORAL_TABLET | ORAL | Status: DC | PRN

## 2011-11-18 MED ORDER — ASPIRIN 325 MG PO TBEC: 325.0000 mg | DELAYED_RELEASE_TABLET | Freq: Two times a day (BID) | ORAL | Status: DC

## 2011-11-18 MED ORDER — METHOCARBAMOL 500 MG PO TABS: 500.0000 mg | ORAL_TABLET | Freq: Four times a day (QID) | ORAL | Status: DC | PRN

## 2011-11-18 NOTE — Progress Notes (Signed)
Agree with PT treatment note.  Tehran Rabenold, PT DPT [phone]  

## 2011-11-18 NOTE — Progress Notes (Signed)
Physical Therapy Treatment Patient Details Name: Jimmy Black MRN: [id number] DOB: 05-21-1960 Today's Date: 11/18/2011 Time: 4098-1191 PT Time Calculation (min): 36 min  PT Assessment / Plan / Recommendation Comments on Treatment Session  Pt able to increase ambulation distance and gait quality.  Prior to d/c pt needs to practice stair negotiation.      Follow Up Recommendations  Home health PT    Barriers to Discharge        Equipment Recommendations  Rolling walker with 5" wheels;3 in 1 bedside comode    Recommendations for Other Services Other (comment) (None)  Frequency 7X/week   Plan Discharge plan remains appropriate;Frequency remains appropriate    Precautions / Restrictions Precautions Precautions: Knee Restrictions Weight Bearing Restrictions: Yes RLE Weight Bearing: Weight bearing as tolerated   Pertinent Vitals/Pain 6-7/10 R LE    Mobility  Bed Mobility Bed Mobility: Supine to Sit;Sitting - Scoot to Edge of Bed Supine to Sit: 5: Supervision;HOB flat Sitting - Scoot to Edge of Bed: 5: Supervision Details for Bed Mobility Assistance: Cues for proper technique and safety Transfers Transfers: Sit to Stand;Stand to Sit Sit to Stand: 4: Min guard;With upper extremity assist;From bed Stand to Sit: 4: Min guard;To chair/3-in-1;With upper extremity assist Details for Transfer Assistance: Cues for proper RW placement and proper technique Ambulation/Gait Ambulation/Gait Assistance: 4: Min guard Ambulation Distance (Feet): 90 Feet Assistive device: Rolling walker Ambulation/Gait Assistance Details: Cues for step through sequence and posture Gait Pattern: Step-to pattern;Step-through pattern;Decreased stride length;Antalgic Gait velocity: decreased Stairs: No Wheelchair Mobility Wheelchair Mobility: No    Exercises Total Joint Exercises Quad Sets: AROM;Right;10 reps Short Arc QuadBarbaraann Boys;Right;5 reps Heel Slides: AAROM;Right;5 reps Hip  ABduction/ADduction: AROM;Right;5 reps Straight Leg Raises: AAROM;Right;5 reps;Other (comment) (Able to the last rep AROM)   PT Diagnosis:    PT Problem List:   PT Treatment Interventions:     PT Goals Acute Rehab PT Goals PT Goal Formulation: With patient Time For Goal Achievement: 11/17/11 Potential to Achieve Goals: Good Pt will go Supine/Side to Sit: with modified independence;with HOB 0 degrees PT Goal: Supine/Side to Sit - Progress: Progressing toward goal Pt will go Sit to Stand: with modified independence;with upper extremity assist PT Goal: Sit to Stand - Progress: Progressing toward goal Pt will go Stand to Sit: with modified independence;with upper extremity assist PT Goal: Stand to Sit - Progress: Progressing toward goal Pt will Ambulate: >150 feet;with supervision;with least restrictive assistive device PT Goal: Ambulate - Progress: Progressing toward goal Pt will Perform Home Exercise Program: Independently PT Goal: Perform Home Exercise Program - Progress: Progressing toward goal  Visit Information  Last PT Received On: 11/18/11 Assistance Needed: +1    Subjective Data  Subjective: Feeling a lot better now that I am off that other medication Patient Stated Goal: To go home today   Cognition  Overall Cognitive Status: Appears within functional limits for tasks assessed/performed Arousal/Alertness: Awake/alert Orientation Level: Appears intact for tasks assessed Behavior During Session: [facility] for tasks performed    Balance     End of Session PT - End of Session Equipment Utilized During Treatment: Gait belt Activity Tolerance: Patient tolerated treatment well Patient left: in chair;with call bell/phone within reach Nurse Communication: Mobility status   GP     [name] 11/18/2011, 10:59 AM

## 2011-11-18 NOTE — Progress Notes (Signed)
Agree with PT treatment note.  Mahagony Grieb, PT DPT [phone]  

## 2011-11-18 NOTE — Care Management Note (Signed)
    Page 1 of 1   11/18/2011     11:00:50 AM   CARE MANAGEMENT NOTE 11/18/2011  Patient:  Jimmy Black, Jimmy Black   Account Number:  [id number]  Date Initiated:  11/18/2011  Documentation initiated by:  Anette Guarneri  Subjective/Objective Assessment:   POD#2  HH services pre-arranged by MD office  has DME, needs 3n1 and CPM to be delivered to home after discharge     Action/Plan:   Home with [facility] services   Anticipated DC Date:  11/18/2011   Anticipated DC Plan:  HOME W HOME HEALTH SERVICES      DC Planning Services  CM consult      Choice offered to / List presented to:             Status of service:  Completed, signed off Medicare Important Message given?   (If response is "NO", the following Medicare IM given date fields will be blank) Date Medicare IM given:   Date Additional Medicare IM given:    Discharge Disposition:  HOME W HOME HEALTH SERVICES  Per UR Regulation:  Reviewed for med. necessity/level of care/duration of stay  If discussed at Long Length of Stay Meetings, dates discussed:    Comments:  HH services pre-arranged by MD office with Genevieve Norlander has DME except 3n1, 3n1 will be delivered to house with CPM after discharge.

## 2011-11-18 NOTE — Progress Notes (Signed)
PATIENT ID: Jimmy Black  MRN: [id number]  DOB/AGE:  49-10-1960 / 51 y.o.  2 Days Post-Op Procedure(s) (LRB): TOTAL KNEE ARTHROPLASTY (Right)    PROGRESS NOTE Subjective: Patient is alert, oriented, no Nausea, no Vomiting, yes passing gas, no Bowel Movement. Taking PO well. Denies SOB, Chest or Calf Pain. Using Incentive Spirometer, PAS in place. Ambulating well with PT., CPM 0-50 Patient reports pain as moderate  .    Objective: Vital signs in last 24 hours: Filed Vitals:   11/17/11 0440 11/17/11 0600 11/17/11 1400 11/18/11 0621  BP:  136/94 120/87 116/76  Pulse:  84 82 69  Temp:  98.4 F (36.9 C) 98.5 F (36.9 C) 97.5 F (36.4 C)  TempSrc:      Resp: 17 16 17 16   SpO2: 97% 97% 98% 98%      Intake/Output from previous day: I/O last 3 completed shifts: In: 5350 [P.O.:1450; I.V.:3900] Out: 4400 [Urine:4250; Drains:150]   Intake/Output this shift:     LABORATORY DATA:  Basename 11/18/11 0623 11/17/11 0640 11/17/11 0636 11/17/11 0455  WBC 13.4* -- -- 21.2*  HGB 9.7* -- -- 11.4*  HCT 28.8* -- -- 32.2*  PLT 196 -- -- 247  NA -- 132* -- --  K -- 4.8 -- --  CL -- 95* -- --  CO2 -- 24 -- --  BUN -- 15 -- --  CREATININE -- 0.69 -- --  GLUCOSE -- 153* -- --  GLUCAP -- -- 151* --  INR -- -- -- --  CALCIUM -- 9.7 -- --    Examination: Neurologically intact ABD soft Neurovascular intact Sensation intact distally Intact pulses distally Dorsiflexion/Plantar flexion intact Incision: dressing C/D/I}  Assessment:   2 Days Post-Op Procedure(s) (LRB): TOTAL KNEE ARTHROPLASTY (Right) ADDITIONAL DIAGNOSIS:  none  Plan: PT/OT WBAT, CPM 5/hrs day until ROM 0-90 degrees, then D/C CPM DVT Prophylaxis:  SCDx72hrs, ASA 325 mg BID x 2 weeks DISCHARGE PLAN: Home today DISCHARGE NEEDS: HHPT, HHRN, CPM, Walker and 3-in-1 comode seat     Constancia Geeting M. 11/18/2011, 9:35 AM

## 2011-11-18 NOTE — Discharge Summary (Signed)
Patient ID: Jimmy Black MRN: [id number] DOB/AGE: 1961/01/01 51 y.o.  Admit date: 11/16/2011 Discharge date: 11/18/2011  Admission Diagnoses:  Principal Problem:  *Arthritis of right knee   Discharge Diagnoses:  Same  Past Medical History  Diagnosis Date  . Alcohol abuse   . Arthritis   . Chicken pox   . Hyperlipemia   . Anxiety     Surgeries: Procedure(s): TOTAL KNEE ARTHROPLASTY on 11/16/2011   Consultants:    Discharged Condition: Improved  Hospital Course: Demarri Elie Blaker is an 51 y.o. male who was admitted 11/16/2011 for operative treatment ofArthritis of right knee. Patient has severe unremitting pain that affects sleep, daily activities, and work/hobbies. After pre-op clearance the patient was taken to the operating room on 11/16/2011 and underwent  Procedure(s): TOTAL KNEE ARTHROPLASTY.    Patient was given perioperative antibiotics: Anti-infectives     Start     Dose/Rate Route Frequency Ordered Stop   11/16/11 1505   cefUROXime (ZINACEF) injection  Status:  Discontinued          As needed 11/16/11 1505 11/16/11 1539   11/15/11 1417   vancomycin (VANCOCIN) 1,500 mg in sodium chloride 0.9 % 500 mL IVPB        1,500 mg 250 mL/hr over 120 Minutes Intravenous 60 min pre-op 11/15/11 1417 11/16/11 1340           Patient was given sequential compression devices, early ambulation, and chemoprophylaxis to prevent DVT.  Patient benefited maximally from hospital stay and there were no complications.    Recent vital signs: Patient Vitals for the past 24 hrs:  BP Temp Pulse Resp SpO2  11/18/11 0621 116/76 mmHg 97.5 F (36.4 C) 69  16  98 %  Dec 08, 2011 1400 120/87 mmHg 98.5 F (36.9 C) 82  17  98 %     Recent laboratory studies:  Basename 11/18/11 0623 Dec 08, 2011 0640 08-Dec-2011 0455  WBC 13.4* -- 21.2*  HGB 9.7* -- 11.4*  HCT 28.8* -- 32.2*  PLT 196 -- 247  NA -- 132* --  K -- 4.8 --  CL -- 95* --  CO2 -- 24 --  BUN -- 15 --  CREATININE --  0.69 --  GLUCOSE -- 153* --  INR -- -- --  CALCIUM -- 9.7 --     Discharge Medications:     Medication List     As of 11/18/2011  9:38 AM    TAKE these medications         aspirin 325 MG EC tablet   Take 1 tablet (325 mg total) by mouth 2 (two) times daily.      HYDROmorphone 2 MG tablet   Commonly known as: DILAUDID   Take 1-2 tablets (2-4 mg total) by mouth every 4 (four) hours as needed for pain.      MENS MULTI VITAMIN & MINERAL PO   Take by mouth daily.      methocarbamol 500 MG tablet   Commonly known as: ROBAXIN   Take 1 tablet (500 mg total) by mouth every 6 (six) hours as needed.      PARoxetine 40 MG tablet   Commonly known as: PAXIL   Take 40 mg by mouth every morning.        Diagnostic Studies: Dg Chest 2 View  11/10/2011  *RADIOLOGY REPORT*  Clinical Data: Preoperative assessment for right total knee arthroplasty  CHEST - 2 VIEW  Comparison: 06/16/2008  Findings: Normal heart size, mediastinal contours, and pulmonary vascularity. Minimal  bronchitic changes. No pulmonary infiltrate, pleural effusion or pneumothorax. Osseous structures unremarkable.  IMPRESSION: Minimal bronchitic changes.   Original Report Authenticated By: Lollie Marrow, M.D.     Disposition:       Discharge Orders    Future Orders Please Complete By Expires   Increase activity slowly      Walker       May shower / Bathe      Driving Restrictions      Comments:   No driving for 2 weeks.   Change dressing (specify)      Comments:   Dressing change as needed.   Call MD for:  temperature >100.4      Call MD for:  severe uncontrolled pain      Call MD for:  redness, tenderness, or signs of infection (pain, swelling, redness, odor or green/yellow discharge around incision site)      Discharge instructions      Comments:   F/U with Dr. Turner Daniels as scheduled.         SignedHazle Nordmann. 11/18/2011, 9:38 AM

## 2011-11-18 NOTE — Progress Notes (Signed)
Physical Therapy Progress Note   11/18/11 1300  PT Visit Information  Last PT Received On 11/18/11  Assistance Needed +1  PT Time Calculation  PT Start Time 1140  PT Stop Time 1155  PT Time Calculation (min) 15 min  Subjective Data  Subjective Feeling good to get up but towel roll really hurt.  Patient Stated Goal To go home today  Precautions  Precautions Knee  Restrictions  Weight Bearing Restrictions Yes  RLE Weight Bearing WBAT  Cognition  Overall Cognitive Status Appears within functional limits for tasks assessed/performed  Arousal/Alertness Awake/alert  Orientation Level Appears intact for tasks assessed  Behavior During Session [facility] for tasks performed  Bed Mobility  Bed Mobility Not assessed  Transfers  Transfers Sit to Stand;Stand to Sit  Sit to Stand 5: Supervision;With upper extremity assist;From chair/3-in-1  Stand to Sit 5: Supervision;With upper extremity assist;To chair/3-in-1  Details for Transfer Assistance Cues for proper technique  Ambulation/Gait  Ambulation/Gait Assistance 5: Supervision  Ambulation Distance (Feet) 120 Feet  Assistive device Rolling walker  Ambulation/Gait Assistance Details Cues for posture and safety  Gait Pattern Step-through pattern;Decreased stride length  Gait velocity decreased  Stairs Yes  Stairs Assistance 4: Min assist  Stairs Assistance Details (indicate cue type and reason) (A) RW walker placement and cues for proper technique and safety  Stair Management Technique No rails;Backwards;With walker  Number of Stairs 2   Wheelchair Mobility  Wheelchair Mobility No  Exercises  Exercises Total Joint  Total Joint Exercises  Goniometric ROM 11-40  PT - End of Session  Equipment Utilized During Treatment Gait belt  Activity Tolerance Patient tolerated treatment well  Patient left in chair;with call bell/phone within reach  Nurse Communication Mobility status  PT - Assessment/Plan  Comments on Treatment Session Pt practiced  stair negotiation.  Pt was given handouts for exercises and stairs.  Pt increase ambulation distance and gait quality.  Pt ready for d/c when deemed medically safe.  PT Plan Discharge plan remains appropriate;Frequency remains appropriate  PT Frequency 7X/week  Recommendations for Other Services Other (comment) (None)  Follow Up Recommendations Home health PT  Equipment Recommended Rolling walker with 5" wheels;3 in 1 bedside comode  Acute Rehab PT Goals  PT Goal Formulation With patient  Time For Goal Achievement 11/17/11  Potential to Achieve Goals Good  Pt will go Sit to Stand with modified independence;with upper extremity assist  PT Goal: Sit to Stand - Progress Progressing toward goal  Pt will go Stand to Sit with modified independence;with upper extremity assist  PT Goal: Stand to Sit - Progress Progressing toward goal  Pt will Ambulate >150 feet;with supervision;with least restrictive assistive device  PT Goal: Ambulate - Progress Partly met  Pt will Go Up / Down Stairs 3-5 stairs;with min assist;with least restrictive assistive device  PT Goal: Up/Down Stairs - Progress Met  Pt will Perform Home Exercise Program Independently  PT Goal: Perform Home Exercise Program - Progress Progressing toward goal    Pt reports pain in R LE 6-7/10.  Nasiah Polinsky, SPT

## 2012-04-07 ENCOUNTER — Other Ambulatory Visit: Payer: Self-pay

## 2012-04-12 ENCOUNTER — Ambulatory Visit: Payer: Medicare PPO | Admitting: Family Medicine

## 2012-04-16 ENCOUNTER — Ambulatory Visit (INDEPENDENT_AMBULATORY_CARE_PROVIDER_SITE_OTHER): Payer: Medicare PPO | Admitting: Family Medicine

## 2012-04-16 DIAGNOSIS — Z299 Encounter for prophylactic measures, unspecified: Secondary | ICD-10-CM

## 2012-04-18 LAB — TB SKIN TEST: TB Skin Test: NEGATIVE

## 2012-06-21 HISTORY — PX: SHOULDER ARTHROSCOPY WITH OPEN ROTATOR CUFF REPAIR: SHX6092

## 2012-07-03 ENCOUNTER — Other Ambulatory Visit: Payer: Self-pay | Admitting: Orthopedic Surgery

## 2012-07-03 DIAGNOSIS — M25512 Pain in left shoulder: Secondary | ICD-10-CM

## 2012-07-05 ENCOUNTER — Ambulatory Visit
Admission: RE | Admit: 2012-07-05 | Discharge: 2012-07-05 | Disposition: A | Discharge status: Home / Self Care | Payer: Medicare PPO | Source: Ambulatory Visit | Attending: Orthopedic Surgery | Admitting: Orthopedic Surgery

## 2012-07-05 DIAGNOSIS — M25512 Pain in left shoulder: Secondary | ICD-10-CM

## 2012-11-20 ENCOUNTER — Other Ambulatory Visit: Payer: Self-pay | Admitting: Orthopedic Surgery

## 2012-11-23 ENCOUNTER — Encounter (HOSPITAL_COMMUNITY): Payer: Self-pay

## 2012-11-27 ENCOUNTER — Other Ambulatory Visit (HOSPITAL_COMMUNITY): Payer: Medicare PPO

## 2012-11-28 ENCOUNTER — Encounter (HOSPITAL_COMMUNITY)
Admission: RE | Admit: 2012-11-28 | Discharge: 2012-11-28 | Disposition: A | Discharge status: Home / Self Care | Payer: Medicare PPO | Source: Ambulatory Visit | Attending: Orthopedic Surgery | Admitting: Orthopedic Surgery

## 2012-11-28 ENCOUNTER — Encounter (HOSPITAL_COMMUNITY): Payer: Self-pay

## 2012-11-28 DIAGNOSIS — Z01818 Encounter for other preprocedural examination: Secondary | ICD-10-CM | POA: Insufficient documentation

## 2012-11-28 DIAGNOSIS — Z01810 Encounter for preprocedural cardiovascular examination: Secondary | ICD-10-CM | POA: Insufficient documentation

## 2012-11-28 DIAGNOSIS — Z01812 Encounter for preprocedural laboratory examination: Secondary | ICD-10-CM | POA: Insufficient documentation

## 2012-11-28 HISTORY — DX: Hyperlipidemia, unspecified: E78.5

## 2012-11-28 HISTORY — DX: Pain in unspecified joint: M25.50

## 2012-11-28 HISTORY — DX: Polyneuropathy, unspecified: G62.9

## 2012-11-28 HISTORY — DX: Anesthesia of skin: R20.0

## 2012-11-28 HISTORY — DX: Effusion, unspecified joint: M25.40

## 2012-11-28 HISTORY — DX: Family history of other specified conditions: Z84.89

## 2012-11-28 LAB — URINALYSIS, ROUTINE W REFLEX MICROSCOPIC
Bilirubin Urine: NEGATIVE
Ketones, ur: NEGATIVE mg/dL
Nitrite: NEGATIVE
Protein, ur: NEGATIVE mg/dL
Specific Gravity, Urine: 1.018 (ref 1.005–1.030)
Urobilinogen, UA: 0.2 mg/dL (ref 0.0–1.0)
pH: 6.5 (ref 5.0–8.0)

## 2012-11-28 LAB — CBC WITH DIFFERENTIAL/PLATELET
Basophils Relative: 0 % (ref 0–1)
Eosinophils Absolute: 0.2 10*3/uL (ref 0.0–0.7)
Eosinophils Relative: 2 % (ref 0–5)
HCT: 42.2 % (ref 39.0–52.0)
Hemoglobin: 14.8 g/dL (ref 13.0–17.0)
Lymphs Abs: 2.3 10*3/uL (ref 0.7–4.0)
MCH: 30.4 pg (ref 26.0–34.0)
MCHC: 35.1 g/dL (ref 30.0–36.0)
MCV: 86.7 fL (ref 78.0–100.0)
Monocytes Absolute: 0.7 10*3/uL (ref 0.1–1.0)
Monocytes Relative: 8 % (ref 3–12)
Neutro Abs: 5.4 10*3/uL (ref 1.7–7.7)
RDW: 12.5 % (ref 11.5–15.5)

## 2012-11-28 LAB — COMPREHENSIVE METABOLIC PANEL
Albumin: 4.4 g/dL (ref 3.5–5.2)
BUN: 18 mg/dL (ref 6–23)
Calcium: 9.7 mg/dL (ref 8.4–10.5)
Chloride: 103 mEq/L (ref 96–112)
Creatinine, Ser: 0.66 mg/dL (ref 0.50–1.35)
GFR calc non Af Amer: >90 mL/min (ref >90)
Total Bilirubin: 0.4 mg/dL (ref 0.3–1.2)

## 2012-11-28 LAB — TYPE AND SCREEN
ABO/RH(D): O NEG
Antibody Screen: NEGATIVE

## 2012-11-28 LAB — PROTIME-INR
INR: 0.97 (ref 0.00–1.49)
Prothrombin Time: 12.7 seconds (ref 11.6–15.2)

## 2012-11-28 MED ORDER — CHLORHEXIDINE GLUCONATE 4 % EX LIQD: 60.0000 mL | Freq: Once | CUTANEOUS | Status: DC

## 2012-11-28 NOTE — Progress Notes (Signed)
11/28/12 1507  OBSTRUCTIVE SLEEP APNEA  Have you ever been diagnosed with sleep apnea through a sleep study? No  Do you snore loudly (loud enough to be heard through closed doors)?  1  Do you often feel tired, fatigued, or sleepy during the daytime? 0  Has anyone observed you stop breathing during your sleep? 1  Do you have, or are you being treated for high blood pressure? 0  BMI more than 35 kg/m2? 0  Age over 50 years old? 1  Neck circumference greater than 40 cm/18 inches? 0  Gender: 1  Obstructive Sleep Apnea Score 4  Score 4 or greater  Results sent to PCP

## 2012-11-28 NOTE — Progress Notes (Signed)
Pt saw a cardiologist about 3-31yrs ago d/t funny feeling and turned out to be nothing  Denies echo or stress test  Denies ever having a heart cath  Dr.Bruce Burchette is Medical Md  Denies EKG or CXR in past yr

## 2012-11-28 NOTE — Pre-Procedure Instructions (Signed)
Jimmy Black  11/28/2012   Your procedure is scheduled on:  Mon, Oct 13 @ 11:00 AM  Report to [facility] Stay Entrance A at 8:00 AM.  Call this number if you have problems the morning of surgery: [phone]   Remember:   Do not eat food or drink liquids after midnight.   Take these medicines the morning of surgery with A SIP OF WATER: Paxil(Paroxetine)               Stop taking your Fish Oil,Ibuprofen,and Red Yeast Rice.No Goody's,BC's,Aleve,Aspirin,or any Herbal Medications   Do not wear jewelry  Do not wear lotions, powders, or colognes. You may wear deodorant.  Men may shave face and neck.  Do not bring valuables to the hospital.  [facility] is not responsible                  for any belongings or valuables.               Contacts, dentures or bridgework may not be worn into surgery.  Leave suitcase in the car. After surgery it may be brought to your room.  For patients admitted to the hospital, discharge time is determined by your                treatment team.               Special Instructions: Shower using CHG 2 nights before surgery and the night before surgery.  If you shower the day of surgery use CHG.  Use special wash - you have one bottle of CHG for all showers.  You should use approximately 1/3 of the bottle for each shower.   Please read over the following fact sheets that you were given: Pain Booklet, Coughing and Deep Breathing, Blood Transfusion Information, Total Joint Packet, MRSA Information and Surgical Site Infection Prevention

## 2012-11-29 LAB — URINE CULTURE: Colony Count: NO GROWTH

## 2012-11-30 ENCOUNTER — Telehealth: Payer: Self-pay | Admitting: Family Medicine

## 2012-11-30 NOTE — Telephone Encounter (Signed)
Set up follow up to discuss.  Not seen in > one year.  There are many types of stress tests screenings.Marland Kitchen

## 2012-11-30 NOTE — Telephone Encounter (Signed)
Pt would like to have a stress test.  Pt has a family history of high cholesterol and heart disease and would like to be proactive. Pls advise.

## 2012-11-30 NOTE — Telephone Encounter (Signed)
Can you please call and set up appointment for patient. Follow up - to discuss stress test

## 2012-12-02 MED ORDER — BUPIVACAINE LIPOSOME 1.3 % IJ SUSP
20.0000 mL | Freq: Once | INTRAMUSCULAR | Status: DC
  Filled 2012-12-02: qty 20

## 2012-12-02 MED ORDER — SODIUM CHLORIDE 0.9 % IV SOLN: INTRAVENOUS | Status: DC

## 2012-12-02 MED ORDER — TRANEXAMIC ACID 100 MG/ML IV SOLN
1000.0000 mg | INTRAVENOUS | Status: AC
  Administered 2012-12-03: 1000 mg via INTRAVENOUS
  Filled 2012-12-02: qty 10

## 2012-12-02 MED ORDER — DEXTROSE 5 % IV SOLN
3.0000 g | INTRAVENOUS | Status: AC
  Administered 2012-12-03: 3 g via INTRAVENOUS
  Filled 2012-12-02: qty 3000

## 2012-12-03 ENCOUNTER — Encounter (HOSPITAL_COMMUNITY): Payer: Self-pay | Admitting: *Deleted

## 2012-12-03 ENCOUNTER — Encounter (HOSPITAL_COMMUNITY): Payer: Medicare PPO | Admitting: Certified Registered"

## 2012-12-03 ENCOUNTER — Ambulatory Visit (HOSPITAL_COMMUNITY): Payer: Medicare PPO | Admitting: Certified Registered"

## 2012-12-03 ENCOUNTER — Inpatient Hospital Stay (HOSPITAL_COMMUNITY)
Admission: RE | Admit: 2012-12-03 | Discharge: 2012-12-04 | DRG: 496 | Disposition: A | Discharge status: Home Health | Payer: Medicare PPO | Source: Ambulatory Visit | Attending: Orthopedic Surgery | Admitting: Orthopedic Surgery

## 2012-12-03 ENCOUNTER — Encounter (HOSPITAL_COMMUNITY)
Admission: RE | Disposition: A | Discharge status: Home Health | Payer: Self-pay | Source: Ambulatory Visit | Attending: Orthopedic Surgery

## 2012-12-03 DIAGNOSIS — Z96659 Presence of unspecified artificial knee joint: Secondary | ICD-10-CM

## 2012-12-03 DIAGNOSIS — F411 Generalized anxiety disorder: Secondary | ICD-10-CM | POA: Diagnosis present

## 2012-12-03 DIAGNOSIS — E785 Hyperlipidemia, unspecified: Secondary | ICD-10-CM | POA: Diagnosis present

## 2012-12-03 DIAGNOSIS — F1010 Alcohol abuse, uncomplicated: Secondary | ICD-10-CM | POA: Diagnosis present

## 2012-12-03 DIAGNOSIS — G609 Hereditary and idiopathic neuropathy, unspecified: Secondary | ICD-10-CM | POA: Diagnosis present

## 2012-12-03 DIAGNOSIS — M2469 Ankylosis, other specified joint: Principal | ICD-10-CM | POA: Diagnosis present

## 2012-12-03 DIAGNOSIS — M1710 Unilateral primary osteoarthritis, unspecified knee: Secondary | ICD-10-CM | POA: Diagnosis present

## 2012-12-03 DIAGNOSIS — M24669 Ankylosis, unspecified knee: Principal | ICD-10-CM | POA: Diagnosis present

## 2012-12-03 DIAGNOSIS — M25561 Pain in right knee: Secondary | ICD-10-CM

## 2012-12-03 DIAGNOSIS — D62 Acute posthemorrhagic anemia: Secondary | ICD-10-CM | POA: Diagnosis not present

## 2012-12-03 HISTORY — PX: TOTAL KNEE REVISION WITH SCAR DEBRIDEMENT/PATELLA REVISION WITH POLY EXCHANGE: SHX6128

## 2012-12-03 HISTORY — PX: EXCISIONAL TOTAL KNEE ARTHROPLASTY: SHX5015

## 2012-12-03 HISTORY — DX: Unspecified osteoarthritis, unspecified site: M19.90

## 2012-12-03 LAB — CBC
MCH: 30.4 pg (ref 26.0–34.0)
MCHC: 34.8 g/dL (ref 30.0–36.0)
MCV: 87.4 fL (ref 78.0–100.0)
Platelets: 212 10*3/uL (ref 150–400)
RDW: 12.7 % (ref 11.5–15.5)

## 2012-12-03 SURGERY — EXCISIONAL TOTAL KNEE ARTHROPLASTY
Anesthesia: General | Site: Knee | Laterality: Right | Wound class: Clean

## 2012-12-03 MED ORDER — ALUM & MAG HYDROXIDE-SIMETH 200-200-20 MG/5ML PO SUSP: 30.0000 mL | ORAL | Status: DC | PRN

## 2012-12-03 MED ORDER — METHOCARBAMOL 500 MG PO TABS
500.0000 mg | ORAL_TABLET | Freq: Four times a day (QID) | ORAL | Status: DC | PRN
  Filled 2012-12-03: qty 1

## 2012-12-03 MED ORDER — METHOCARBAMOL 100 MG/ML IJ SOLN
500.0000 mg | Freq: Four times a day (QID) | INTRAMUSCULAR | Status: DC | PRN
  Filled 2012-12-03: qty 5

## 2012-12-03 MED ORDER — MIDAZOLAM HCL 5 MG/ML IJ SOLN: 2.0000 mg | Freq: Once | INTRAMUSCULAR | Status: DC

## 2012-12-03 MED ORDER — ROCURONIUM BROMIDE 100 MG/10ML IV SOLN
INTRAVENOUS | Status: DC | PRN
  Administered 2012-12-03: 50 mg via INTRAVENOUS

## 2012-12-03 MED ORDER — DEXMEDETOMIDINE HCL IN NACL 200 MCG/50ML IV SOLN: 0.4000 ug/kg/h | INTRAVENOUS | Status: DC

## 2012-12-03 MED ORDER — MENTHOL 3 MG MT LOZG: 1.0000 | LOZENGE | OROMUCOSAL | Status: DC | PRN

## 2012-12-03 MED ORDER — PAROXETINE HCL 20 MG PO TABS
40.0000 mg | ORAL_TABLET | Freq: Every day | ORAL | Status: DC
  Administered 2012-12-04: 40 mg via ORAL
  Filled 2012-12-03: qty 2

## 2012-12-03 MED ORDER — DOCUSATE SODIUM 100 MG PO CAPS
100.0000 mg | ORAL_CAPSULE | Freq: Two times a day (BID) | ORAL | Status: DC
  Administered 2012-12-03 – 2012-12-04 (×2): 100 mg via ORAL
  Filled 2012-12-03 (×3): qty 1

## 2012-12-03 MED ORDER — DEXMEDETOMIDINE HCL IN NACL 200 MCG/50ML IV SOLN
INTRAVENOUS | Status: DC | PRN
  Administered 2012-12-03: 0.7 ug/kg/h via INTRAVENOUS

## 2012-12-03 MED ORDER — DIPHENHYDRAMINE HCL 12.5 MG/5ML PO ELIX: 12.5000 mg | ORAL_SOLUTION | ORAL | Status: DC | PRN

## 2012-12-03 MED ORDER — ACETAMINOPHEN 325 MG PO TABS: 650.0000 mg | ORAL_TABLET | Freq: Four times a day (QID) | ORAL | Status: DC | PRN

## 2012-12-03 MED ORDER — MIDAZOLAM HCL 2 MG/2ML IJ SOLN
INTRAMUSCULAR | Status: AC
  Administered 2012-12-03: 2 mg
  Filled 2012-12-03: qty 2

## 2012-12-03 MED ORDER — SODIUM CHLORIDE 0.9 % IV SOLN
INTRAVENOUS | Status: DC
  Administered 2012-12-04: 05:00:00 via INTRAVENOUS

## 2012-12-03 MED ORDER — ACETAMINOPHEN 650 MG RE SUPP: 650.0000 mg | Freq: Four times a day (QID) | RECTAL | Status: DC | PRN

## 2012-12-03 MED ORDER — BISACODYL 5 MG PO TBEC: 5.0000 mg | DELAYED_RELEASE_TABLET | Freq: Every day | ORAL | Status: DC | PRN

## 2012-12-03 MED ORDER — PROPOFOL 10 MG/ML IV BOLUS
INTRAVENOUS | Status: DC | PRN
  Administered 2012-12-03: 350 mg via INTRAVENOUS

## 2012-12-03 MED ORDER — FENTANYL CITRATE 0.05 MG/ML IJ SOLN
INTRAMUSCULAR | Status: AC
  Filled 2012-12-03: qty 2

## 2012-12-03 MED ORDER — NEOSTIGMINE METHYLSULFATE 1 MG/ML IJ SOLN
INTRAMUSCULAR | Status: DC | PRN
  Administered 2012-12-03: 4 mg via INTRAVENOUS

## 2012-12-03 MED ORDER — SODIUM CHLORIDE 0.9 % IJ SOLN
INTRAMUSCULAR | Status: DC | PRN
  Administered 2012-12-03: 12:00:00

## 2012-12-03 MED ORDER — CEFAZOLIN SODIUM-DEXTROSE 2-3 GM-% IV SOLR
2.0000 g | Freq: Four times a day (QID) | INTRAVENOUS | Status: AC
  Administered 2012-12-03 (×2): 2 g via INTRAVENOUS
  Filled 2012-12-03 (×3): qty 50

## 2012-12-03 MED ORDER — THROMBIN 20000 UNITS EX KIT
PACK | CUTANEOUS | Status: AC
  Filled 2012-12-03: qty 1

## 2012-12-03 MED ORDER — SODIUM CHLORIDE 0.9 % IR SOLN
Status: DC | PRN
  Administered 2012-12-03: 3000 mL

## 2012-12-03 MED ORDER — ONDANSETRON HCL 4 MG PO TABS: 4.0000 mg | ORAL_TABLET | Freq: Four times a day (QID) | ORAL | Status: DC | PRN

## 2012-12-03 MED ORDER — SENNOSIDES-DOCUSATE SODIUM 8.6-50 MG PO TABS: 1.0000 | ORAL_TABLET | Freq: Every evening | ORAL | Status: DC | PRN

## 2012-12-03 MED ORDER — HYDROMORPHONE HCL PF 1 MG/ML IJ SOLN: 0.2500 mg | INTRAMUSCULAR | Status: DC | PRN

## 2012-12-03 MED ORDER — OXYCODONE HCL 5 MG PO TABS
5.0000 mg | ORAL_TABLET | Freq: Once | ORAL | Status: DC | PRN
Start: 2012-12-03 — End: 2012-12-03

## 2012-12-03 MED ORDER — METOCLOPRAMIDE HCL 10 MG PO TABS: 5.0000 mg | ORAL_TABLET | Freq: Three times a day (TID) | ORAL | Status: DC | PRN

## 2012-12-03 MED ORDER — ARTIFICIAL TEARS OP OINT
TOPICAL_OINTMENT | OPHTHALMIC | Status: DC | PRN
  Administered 2012-12-03: 1 via OPHTHALMIC

## 2012-12-03 MED ORDER — GLYCOPYRROLATE 0.2 MG/ML IJ SOLN
INTRAMUSCULAR | Status: DC | PRN
  Administered 2012-12-03: 0.6 mg via INTRAVENOUS
  Administered 2012-12-03 (×2): 0.2 mg via INTRAVENOUS

## 2012-12-03 MED ORDER — EPHEDRINE SULFATE 50 MG/ML IJ SOLN
INTRAMUSCULAR | Status: DC | PRN
  Administered 2012-12-03: 10 mg via INTRAVENOUS

## 2012-12-03 MED ORDER — ONDANSETRON HCL 4 MG/2ML IJ SOLN: 4.0000 mg | Freq: Four times a day (QID) | INTRAMUSCULAR | Status: DC | PRN

## 2012-12-03 MED ORDER — OXYCODONE HCL 5 MG PO TABS
15.0000 mg | ORAL_TABLET | ORAL | Status: DC | PRN
  Administered 2012-12-04: 20 mg via ORAL
  Administered 2012-12-04: 15 mg via ORAL
  Administered 2012-12-04: 20 mg via ORAL
  Administered 2012-12-04: 15 mg via ORAL
  Administered 2012-12-04: 20 mg via ORAL
  Filled 2012-12-03: qty 4
  Filled 2012-12-03: qty 3
  Filled 2012-12-03: qty 4
  Filled 2012-12-03: qty 3
  Filled 2012-12-03: qty 4

## 2012-12-03 MED ORDER — HYDROMORPHONE HCL PF 1 MG/ML IJ SOLN
1.0000 mg | INTRAMUSCULAR | Status: DC | PRN
  Administered 2012-12-03 (×3): 1 mg via INTRAVENOUS
  Filled 2012-12-03 (×3): qty 1

## 2012-12-03 MED ORDER — THROMBIN 20000 UNITS EX KIT
PACK | CUTANEOUS | Status: DC | PRN
  Administered 2012-12-03: 20000 [IU] via TOPICAL

## 2012-12-03 MED ORDER — OXYCODONE HCL 5 MG/5ML PO SOLN: 5.0000 mg | Freq: Once | ORAL | Status: DC | PRN

## 2012-12-03 MED ORDER — OXYCODONE HCL ER 20 MG PO T12A
40.0000 mg | EXTENDED_RELEASE_TABLET | Freq: Two times a day (BID) | ORAL | Status: DC
  Administered 2012-12-03 – 2012-12-04 (×2): 40 mg via ORAL
  Filled 2012-12-03 (×2): qty 2

## 2012-12-03 MED ORDER — FENTANYL CITRATE 0.05 MG/ML IJ SOLN
100.0000 ug | Freq: Once | INTRAMUSCULAR | Status: AC
  Administered 2012-12-03: 100 ug via INTRAVENOUS

## 2012-12-03 MED ORDER — METOCLOPRAMIDE HCL 5 MG/ML IJ SOLN: 5.0000 mg | Freq: Three times a day (TID) | INTRAMUSCULAR | Status: DC | PRN

## 2012-12-03 MED ORDER — LACTATED RINGERS IV SOLN
INTRAVENOUS | Status: DC | PRN
  Administered 2012-12-03 (×2): via INTRAVENOUS

## 2012-12-03 MED ORDER — ZOLPIDEM TARTRATE 5 MG PO TABS: 5.0000 mg | ORAL_TABLET | Freq: Every evening | ORAL | Status: DC | PRN

## 2012-12-03 MED ORDER — LIDOCAINE HCL (CARDIAC) 20 MG/ML IV SOLN
INTRAVENOUS | Status: DC | PRN
  Administered 2012-12-03: 40 mg via INTRAVENOUS

## 2012-12-03 MED ORDER — BUPIVACAINE HCL 0.5 % IJ SOLN
INTRAMUSCULAR | Status: DC | PRN
  Administered 2012-12-03: 30 mL

## 2012-12-03 MED ORDER — PHENOL 1.4 % MT LIQD: 1.0000 | OROMUCOSAL | Status: DC | PRN

## 2012-12-03 MED ORDER — LACTATED RINGERS IV SOLN
INTRAVENOUS | Status: DC
  Administered 2012-12-03: 09:00:00 via INTRAVENOUS

## 2012-12-03 MED ORDER — INFLUENZA VAC SPLIT QUAD 0.5 ML IM SUSP
0.5000 mL | INTRAMUSCULAR | Status: AC
  Administered 2012-12-04: 0.5 mL via INTRAMUSCULAR
  Filled 2012-12-03: qty 0.5

## 2012-12-03 MED ORDER — CELECOXIB 200 MG PO CAPS
200.0000 mg | ORAL_CAPSULE | Freq: Two times a day (BID) | ORAL | Status: DC
  Administered 2012-12-03 – 2012-12-04 (×2): 200 mg via ORAL
  Filled 2012-12-03 (×3): qty 1

## 2012-12-03 MED ORDER — ONDANSETRON HCL 4 MG/2ML IJ SOLN: 4.0000 mg | Freq: Once | INTRAMUSCULAR | Status: DC | PRN

## 2012-12-03 MED ORDER — ONDANSETRON HCL 4 MG/2ML IJ SOLN
INTRAMUSCULAR | Status: DC | PRN
  Administered 2012-12-03: 4 mg via INTRAMUSCULAR

## 2012-12-03 MED ORDER — FENTANYL CITRATE 0.05 MG/ML IJ SOLN
INTRAMUSCULAR | Status: DC | PRN
  Administered 2012-12-03: 150 ug via INTRAVENOUS

## 2012-12-03 MED ORDER — ENOXAPARIN SODIUM 30 MG/0.3ML ~~LOC~~ SOLN
30.0000 mg | Freq: Two times a day (BID) | SUBCUTANEOUS | Status: DC
  Administered 2012-12-04: 30 mg via SUBCUTANEOUS
  Filled 2012-12-03 (×3): qty 0.3

## 2012-12-03 SURGICAL SUPPLY — 61 items
BAG URINE DRAINAGE (UROLOGICAL SUPPLIES) IMPLANT
BANDAGE ESMARK 6X9 LF (GAUZE/BANDAGES/DRESSINGS) ×1 IMPLANT
BLADE SAW SGTL 83.5X18.5 (BLADE) IMPLANT
BLADE SAW SGTL NAR THIN XSHT (BLADE) IMPLANT
BLADE SURG 10 STRL SS (BLADE) ×14 IMPLANT
BNDG ESMARK 6X9 LF (GAUZE/BANDAGES/DRESSINGS) ×2
BOWL SMART MIX CTS (DISPOSABLE) IMPLANT
CLOTH BEACON ORANGE TIMEOUT ST (SAFETY) IMPLANT
CONT SPEC 4OZ CLIKSEAL STRL BL (MISCELLANEOUS) ×2 IMPLANT
COVER BACK TABLE 24X17X13 BIG (DRAPES) IMPLANT
COVER SURGICAL LIGHT HANDLE (MISCELLANEOUS) ×2 IMPLANT
CUFF TOURNIQUET SINGLE 34IN LL (TOURNIQUET CUFF) ×2 IMPLANT
DRAPE EXTREMITY T 121X128X90 (DRAPE) ×2 IMPLANT
DRAPE INCISE IOBAN 66X45 STRL (DRAPES) ×4 IMPLANT
DRAPE PROXIMA HALF (DRAPES) ×2 IMPLANT
DRAPE U-SHAPE 47X51 STRL (DRAPES) ×2 IMPLANT
DRSG ADAPTIC 3X8 NADH LF (GAUZE/BANDAGES/DRESSINGS) ×2 IMPLANT
DRSG PAD ABDOMINAL 8X10 ST (GAUZE/BANDAGES/DRESSINGS) ×2 IMPLANT
DURAPREP 26ML APPLICATOR (WOUND CARE) ×4 IMPLANT
ELECT REM PT RETURN 9FT ADLT (ELECTROSURGICAL) ×2
ELECTRODE REM PT RTRN 9FT ADLT (ELECTROSURGICAL) ×1 IMPLANT
EVACUATOR 1/8 PVC DRAIN (DRAIN) ×2 IMPLANT
GEL ULTRASOUND 20GR AQUASONIC (MISCELLANEOUS) IMPLANT
GLOVE BIOGEL PI IND STRL 7.5 (GLOVE) ×1 IMPLANT
GLOVE BIOGEL PI IND STRL 8.5 (GLOVE) ×1 IMPLANT
GLOVE BIOGEL PI INDICATOR 7.5 (GLOVE) ×1
GLOVE BIOGEL PI INDICATOR 8.5 (GLOVE) ×1
GLOVE SURG ORTHO 7.0 STRL STRW (GLOVE) IMPLANT
GLOVE SURG ORTHO 8.0 STRL STRW (GLOVE) ×4 IMPLANT
GOWN PREVENTION PLUS XLARGE (GOWN DISPOSABLE) ×4 IMPLANT
GOWN STRL NON-REIN LRG LVL3 (GOWN DISPOSABLE) ×4 IMPLANT
HANDPIECE INTERPULSE COAX TIP (DISPOSABLE) ×1
HOOD PEEL AWAY FACE SHEILD DIS (HOOD) ×6 IMPLANT
KIT BASIN OR (CUSTOM PROCEDURE TRAY) ×2 IMPLANT
KIT ROOM TURNOVER OR (KITS) ×2 IMPLANT
MANIFOLD NEPTUNE II (INSTRUMENTS) ×2 IMPLANT
NEEDLE 18GX1X1/2 (RX/OR ONLY) (NEEDLE) IMPLANT
NEEDLE HYPO 25GX1X1/2 BEV (NEEDLE) IMPLANT
NEEDLE SPNL 18GX3.5 QUINCKE PK (NEEDLE) ×2 IMPLANT
NS IRRIG 1000ML POUR BTL (IV SOLUTION) ×2 IMPLANT
PACK TOTAL JOINT (CUSTOM PROCEDURE TRAY) ×2 IMPLANT
PAD ARMBOARD 7.5X6 YLW CONV (MISCELLANEOUS) ×4 IMPLANT
PADDING CAST COTTON 6X4 STRL (CAST SUPPLIES) ×2 IMPLANT
SET HNDPC FAN SPRY TIP SCT (DISPOSABLE) ×1 IMPLANT
SPONGE GAUZE 4X4 12PLY (GAUZE/BANDAGES/DRESSINGS) ×2 IMPLANT
STAPLER VISISTAT 35W (STAPLE) ×2 IMPLANT
SUCTION FRAZIER TIP 10 FR DISP (SUCTIONS) IMPLANT
SUT VIC AB 0 CTB1 27 (SUTURE) ×4 IMPLANT
SUT VIC AB 1 CT1 27 (SUTURE) ×4
SUT VIC AB 1 CT1 27XBRD ANBCTR (SUTURE) ×2 IMPLANT
SUT VIC AB 2-0 CT1 27 (SUTURE) ×2
SUT VIC AB 2-0 CT1 TAPERPNT 27 (SUTURE) ×2 IMPLANT
SYR 20CC LL (SYRINGE) IMPLANT
SYR 20ML ECCENTRIC (SYRINGE) IMPLANT
SYR CONTROL 10ML LL (SYRINGE) ×2 IMPLANT
SYRINGE 10CC LL (SYRINGE) ×2 IMPLANT
TOWEL OR 17X24 6PK STRL BLUE (TOWEL DISPOSABLE) ×2 IMPLANT
TOWEL OR 17X26 10 PK STRL BLUE (TOWEL DISPOSABLE) ×2 IMPLANT
TRAY FOLEY CATH 16FRSI W/METER (SET/KITS/TRAYS/PACK) IMPLANT
TUBE ANAEROBIC SPECIMEN COL (MISCELLANEOUS) IMPLANT
WATER STERILE IRR 1000ML POUR (IV SOLUTION) IMPLANT

## 2012-12-03 NOTE — Anesthesia Procedure Notes (Signed)
Anesthesia Regional Block:  Femoral nerve block  Pre-Anesthetic Checklist: ,, timeout performed, Correct Patient, Correct Site, Correct Laterality, Correct Procedure, Correct Position, site marked, Risks and benefits discussed,  Surgical consent,  Pre-op evaluation,  At surgeon's request and post-op pain management  Laterality: Right  Prep: chloraprep       Needles:  Injection technique: Single-shot  Needle Type: Echogenic Stimulator Needle     Needle Length:cm 9 cm Needle Gauge: 22 and 22 G    Additional Needles:  Procedures: nerve stimulator Femoral nerve block  Nerve Stimulator or Paresthesia:  Response: 1 mA,   Additional Responses:   Narrative:  Start time: 12/03/2012 11:10 AM End time: 12/03/2012 11:15 AM Injection made incrementally with aspirations every 5 mL.  Performed by: Personally   Additional Notes: 30 cc 0.5% Marcaine with 1:200 Epi and 4 mg decadron  injected easily

## 2012-12-03 NOTE — Transfer of Care (Signed)
Immediate Anesthesia Transfer of Care Note  Patient: Jimmy Black  Procedure(s) Performed: Procedure(s): EXCISIONAL TOTAL KNEE ARTHROPLASTY (POLY SWAP), SCAR TISSUE REMOVAL, QUADRICEPSPLASTY (Right)  Patient Location: PACU  Anesthesia Type:GA combined with regional for post-op pain  Level of Consciousness: awake, alert  and oriented  Airway & Oxygen Therapy: Patient Spontanous Breathing and Patient connected to nasal cannula oxygen  Post-op Assessment: Report given to PACU RN, Post -op Vital signs reviewed and stable and Patient moving all extremities X 4  Post vital signs: Reviewed and stable  Complications: No apparent anesthesia complications

## 2012-12-03 NOTE — Anesthesia Preprocedure Evaluation (Addendum)
Anesthesia Evaluation  Patient identified by MRN, date of birth, ID band Patient awake    Reviewed: Allergy & Precautions, H&P , NPO status , Patient's Chart, lab work & pertinent test results  Airway Mallampati: II TM Distance: >3 FB Neck ROM: Full    Dental  (+) Teeth Intact and Dental Advisory Given   Pulmonary  breath sounds clear to auscultation        Cardiovascular Rhythm:Regular Rate:Normal     Neuro/Psych Anxiety    GI/Hepatic   Endo/Other    Renal/GU      Musculoskeletal   Abdominal   Peds  Hematology   Anesthesia Other Findings   Reproductive/Obstetrics                          Anesthesia Physical Anesthesia Plan  ASA: II  Anesthesia Plan: General and Combined Spinal and Epidural   Post-op Pain Management:    Induction: Intravenous  Airway Management Planned: LMA  Additional Equipment:   Intra-op Plan:   Post-operative Plan:   Informed Consent: I have reviewed the patients History and Physical, chart, labs and discussed the procedure including the risks, benefits and alternatives for the proposed anesthesia with the patient or authorized representative who has indicated his/her understanding and acceptance.   Dental advisory given  Plan Discussed with: CRNA and Anesthesiologist  Anesthesia Plan Comments:         Anesthesia Quick Evaluation

## 2012-12-03 NOTE — Progress Notes (Signed)
Orthopedic Tech Progress Note Patient Details:  [name] [facility] 07-03-1960 [id number]  [name] [name]: On Right Knee Flexion (Degrees): 90 Right Knee Extension (Degrees): 0 Additional Comments: Trapeze bar and foot roll   Cammer, Mickie Bail 12/03/2012, 2:32 PM

## 2012-12-03 NOTE — H&P (Signed)
Jimmy Black MRN:  [id number] DOB/SEX:  1960/08/29/male  CHIEF COMPLAINT:  Painful right Knee  HISTORY: Patient is a 52 y.o. male presented with a history of pain in the right knee. Onset of symptoms was gradual starting several months ago with rapidly worsening course since that time. Prior procedures on the knee include arthroplasty. Patient has been treated conservatively with over-the-counter NSAIDs and activity modification. Patient currently rates pain in the knee at 10 out of 10 with activity. There is pain at night.  PAST MEDICAL HISTORY: Patient Active Problem List   Diagnosis Date Noted  . Arthritis of right knee 11/16/2011  . History of alcohol abuse 08/17/2011  . ANXIETY 03/07/2009  . KNEE PAIN, RIGHT 03/07/2009   Past Medical History  Diagnosis Date  . Alcohol abuse   . Arthritis   . Chicken pox   . Anxiety     takes Paxil daily  . Family history of anesthesia complication     brother got sick after anesthesia  . Hyperlipidemia     takes Fish Oil daily  . Numbness     both leg and feet  . Peripheral neuropathy   . Joint pain   . Joint swelling    Past Surgical History  Procedure Laterality Date  . Knee surgery  1967, 1973, 1980    right  . Shoulder arthroscopy w/ rotator cuff repair  2006    right  . Total knee arthroplasty  11/16/2011    Procedure: TOTAL KNEE ARTHROPLASTY;  Surgeon: Nestor Lewandowsky, MD;  Location: [facility] OR;  Service: Orthopedics;  Laterality: Right;  right total knee arthroplasty  . Left shoulder surgery       MEDICATIONS:   No prescriptions prior to admission    ALLERGIES:  No Known Allergies  REVIEW OF SYSTEMS:  Pertinent items are noted in HPI.   FAMILY HISTORY:   Family History  Problem Relation Age of Onset  . Cancer Mother 43    breast  . Hyperlipidemia Mother   . Heart disease Mother   . Cancer Father     prostate  . Hyperlipidemia Brother   . Cancer Maternal Aunt     breast  . Alcohol abuse Paternal Aunt    . Heart disease Maternal Grandfather   . Alcohol abuse Paternal Grandfather   . Heart disease Paternal Grandfather     SOCIAL HISTORY:   History  Substance Use Topics  . Smoking status: Never Smoker   . Smokeless tobacco: Not on file  . Alcohol Use: No     EXAMINATION:  Vital signs in last 24 hours:    General appearance: alert, cooperative and no distress Lungs: clear to auscultation bilaterally Heart: regular rate and rhythm, S1, S2 normal, no murmur, click, rub or gallop Abdomen: soft, non-tender; bowel sounds normal; no masses,  no organomegaly Extremities: extremities normal, atraumatic, no cyanosis or edema and Homans sign is negative, no sign of DVT Pulses: 2+ and symmetric Skin: Skin color, texture, turgor normal. No rashes or lesions Neurologic: Alert and oriented X 3, normal strength and tone. Normal symmetric reflexes. Normal coordination and gait  Musculoskeletal:  ROM 0-90, Ligaments intact,  Imaging Review Plain radiographs demonstrate disrupted componets degenerative joint disease of the right knee. The overall alignment is neutral. The bone quality appears to be good for age and reported activity level.  Assessment/Plan:  right knee pain s/p total  The patient history, physical examination and imaging studies are consistent with disruption of arthroplasty degenerative joint  disease of the right knee. The patient has failed conservative treatment.  The clearance notes were reviewed.  After discussion with the patient it was felt that poly swap with quadplasty was indicated. The procedure,  risks, and benefits of total knee arthroplasty were presented and reviewed. The risks including but not limited to aseptic loosening, infection, blood clots, vascular injury, stiffness, patella tracking problems complications among others were discussed. The patient acknowledged the explanation, agreed to proceed with the plan.  Jimmy Black 12/03/2012, 6:47 AM

## 2012-12-03 NOTE — Telephone Encounter (Signed)
Pt is having knee surgery this week and will call after that to set up stress test.

## 2012-12-04 LAB — BASIC METABOLIC PANEL
BUN: 19 mg/dL (ref 6–23)
Chloride: 98 mEq/L (ref 96–112)
Creatinine, Ser: 0.75 mg/dL (ref 0.50–1.35)
GFR calc Af Amer: >90 mL/min (ref >90)
GFR calc non Af Amer: >90 mL/min (ref >90)
Glucose, Bld: 113 mg/dL — ABNORMAL HIGH (ref 70–99)

## 2012-12-04 LAB — CBC
HCT: 36.8 % — ABNORMAL LOW (ref 39.0–52.0)
MCH: 31 pg (ref 26.0–34.0)
MCHC: 35.3 g/dL (ref 30.0–36.0)
MCV: 87.6 fL (ref 78.0–100.0)
Platelets: 254 10*3/uL (ref 150–400)
RDW: 12.7 % (ref 11.5–15.5)

## 2012-12-04 MED ORDER — OXYCODONE HCL ER 40 MG PO T12A: 40.0000 mg | EXTENDED_RELEASE_TABLET | Freq: Two times a day (BID) | ORAL | Status: DC

## 2012-12-04 MED ORDER — OXYCODONE HCL 15 MG PO TABS: 7.5000 mg | ORAL_TABLET | ORAL | Status: DC | PRN

## 2012-12-04 MED ORDER — METHOCARBAMOL 500 MG PO TABS: 500.0000 mg | ORAL_TABLET | Freq: Four times a day (QID) | ORAL | Status: DC | PRN

## 2012-12-04 NOTE — Progress Notes (Signed)
Utilization review completed. Abigail Teall RN CCM Case Mgmt  

## 2012-12-04 NOTE — Progress Notes (Signed)
Patient discharged to home this afternoon.  IV removed, IV site clean, dry, and intact.  Discharge education performed in presence of patient and her son.  Patient voiced understanding of discharge instructions and medications.

## 2012-12-04 NOTE — Op Note (Signed)
Dictation Number:  [id number]

## 2012-12-04 NOTE — Progress Notes (Signed)
   CARE MANAGEMENT NOTE 12/04/2012  Patient:  Jimmy Black, Jimmy Black   Account Number:  [id number]  Date Initiated:  12/03/2012  Documentation initiated by:  Darlyne Russian  Subjective/Objective Assessment:   Patient admitted for RTKR on 12/03/2012     Action/Plan:   Progression of care and discharge planning   Anticipated DC Date:  12/04/2012   Anticipated DC Plan:  HOME W HOME HEALTH SERVICES      DC Planning Services  CM consult      [facility] Choice  HOME HEALTH   Choice offered to / List presented to:  C-1 Patient   DME arranged  CPM      DME agency  [employer]     HH arranged  HH-2 PT      [facility] agency  [facility] Care   Status of service:  Completed, signed off Medicare Important Message given?   (If response is "NO", the following Medicare IM given date fields will be blank) Date Medicare IM given:   Date Additional Medicare IM given:    Discharge Disposition:  HOME W HOME HEALTH SERVICES  Per UR Regulation:    If discussed at Long Length of Stay Meetings, dates discussed:    Comments:  12/04/2012 1400 NCM faxed orders and facesheet to [facility]. Will fax dc summary when it becomes available. NCM spoke to pt and states he does well at home. Lives at home alone. Isidoro Donning RN CCM Case Mgmt phone [phone]  12/03/2012  [address] RN, [place]  [phone] CM consult: HH/ DME  Prior to admission MD office arranged home health PT with [facility].  DME to be provided by [employer].

## 2012-12-04 NOTE — Progress Notes (Signed)
SPORTS MEDICINE AND JOINT REPLACEMENT  Georgena Spurling, MD   Altamese Cabal, PA-C [address] [place], [place], [place]  [address]                             [phone]   PROGRESS NOTE  Subjective:  negative for Chest Pain  negative for Shortness of Breath  negative for Nausea/Vomiting   negative for Calf Pain  negative for Bowel Movement   Tolerating Diet: yes         Patient reports pain as 6 on 0-10 scale.    Objective: Vital signs in last 24 hours:   Patient Vitals for the past 24 hrs:  BP Temp Pulse Resp SpO2  12/04/12 1405 108/69 mmHg 98.1 F (36.7 C) 63 18 97 %  12/04/12 1132 - - - 18 -  12/04/12 0800 - - - 18 -  12/04/12 0531 112/68 mmHg 97.8 F (36.6 C) 81 20 97 %  12/04/12 0400 - - - 17 98 %  12/04/12 0125 111/65 mmHg 97.8 F (36.6 C) 78 18 98 %  12/04/12 0000 - - - 17 98 %  12/03/12 2056 109/68 mmHg 98.9 F (37.2 C) 102 18 94 %  12/03/12 1947 - - - 17 95 %  12/03/12 1834 132/72 mmHg 98.1 F (36.7 C) 90 18 96 %  12/03/12 1616 - - - 18 95 %  12/03/12 1545 100/65 mmHg 98.1 F (36.7 C) 62 18 98 %  12/03/12 1515 96/66 mmHg 98 F (36.7 C) 58 15 95 %  12/03/12 1500 - - 56 15 97 %  12/03/12 1445 - - 57 14 99 %    @flow {1959:LAST@   Intake/Output from previous day:   10/13 0701 - 10/14 0700 In: 2360 [P.O.:960; I.V.:1400] Out: 2285 [Urine:2185; Drains:100]   Intake/Output this shift:   10/14 0701 - 10/14 1900 In: 1031.3 [P.O.:840; I.V.:191.3] Out: 1575 [Urine:1575]   Intake/Output     10/13 0701 - 10/14 0700 10/14 0701 - 10/15 0700   P.O. 960 840   I.V. (mL/kg) 1400 (11.2) 191.3 (1.5)   Total Intake(mL/kg) 2360 (18.9) 1031.3 (8.3)   Urine (mL/kg/hr) 2185 1575 (1.7)   Drains 100    Total Output 2285 1575   Net +75 -543.8           LABORATORY DATA:  Recent Labs  11/28/12 1520 12/03/12 1648 12/04/12 0500  WBC 8.6 12.9* 16.1*  HGB 14.8 13.0 13.0  HCT 42.2 37.4* 36.8*  PLT 260 212 254    Recent Labs  11/28/12 1520 12/03/12 1648  12/04/12 0500  NA 141  --  137  K 4.1  --  4.3  CL 103  --  98  CO2 25  --  29  BUN 18  --  19  CREATININE 0.66 0.81 0.75  GLUCOSE 84  --  113*  CALCIUM 9.7  --  9.6   Lab Results  Component Value Date   INR 0.97 11/28/2012   INR 0.98 11/10/2011    Examination:  General appearance: alert, cooperative and no distress Extremities: Homans sign is negative, no sign of DVT  Wound Exam: clean, dry, intact   Drainage:  None: wound tissue dry  Motor Exam: EHL and FHL Intact  Sensory Exam: Deep Peroneal normal   Assessment:    1 Day Post-Op  Procedure(s) (LRB): EXCISIONAL TOTAL KNEE ARTHROPLASTY (POLY SWAP), SCAR TISSUE REMOVAL, QUADRICEPSPLASTY (Right)  ADDITIONAL DIAGNOSIS:  Active Problems:   *  No active hospital problems. *  Acute Blood Loss Anemia   Plan: Physical Therapy as ordered Weight Bearing as Tolerated (WBAT)  DVT Prophylaxis:  Lovenox  DISCHARGE PLAN: Home  DISCHARGE NEEDS: HHPT, CPM, Walker and 3-in-1 comode seat         Coti Burd 12/04/2012, 2:33 PM

## 2012-12-04 NOTE — Progress Notes (Signed)
OT Cancellation Note  Patient Details Name: Jimmy Black MRN: [id number] DOB: 1960/08/15   Cancelled Treatment:   Pt declined OT eval stating "This is not my first surgery. I have everything I need already at home." Reviewed recommended DME & AE recommendation. Pt again confirms he has needed AE & DME. No follow up OT indicated at this time. PT has d/c'd pt as (I).   Jovonne Wilton, Deidre Ala 12/04/2012, 2:55 PM

## 2012-12-04 NOTE — Evaluation (Signed)
Physical Therapy Evaluation Patient Details Name: Jimmy Black MRN: [id number]   DOB: [birth year] Today's Date: 12/04/2012 Time: 7846-9629 PT Time Calculation (min): 40 min  PT Assessment / Plan / Recommendation History of Present Illness  s/p knee revision  Clinical Impression  Patient is s/p above surgery resulting in functional limitations due to the deficits listed below (see PT Problem List).  Patient will benefit from skilled PT to increase their independence and safety with mobility to allow discharge to the venue listed below.    Overall pt is moving quite well, and from a functional mobility standpoint, could dc home today or tomorrow     PT Assessment  Patient needs continued PT services    Follow Up Recommendations  Home health PT;Supervision - Intermittent    Does the patient have the potential to tolerate intense rehabilitation      Barriers to Discharge        Equipment Recommendations  Rolling walker with 5" wheels;3in1 (PT)    Recommendations for Other Services     Frequency      Precautions / Restrictions Precautions Precautions: Knee Restrictions RLE Weight Bearing: Weight bearing as tolerated   Pertinent Vitals/Pain 5/10 R knee post amb; patient repositioned for comfort in CPM      Mobility  Bed Mobility Bed Mobility: Supine to Sit;Sit to Supine Supine to Sit: 5: Supervision Sit to Supine: 5: Supervision Details for Bed Mobility Assistance: Cues for technique, and to work on quad activation for straight leg raise into bed Transfers Transfers: Sit to Stand;Stand to Sit Sit to Stand: 5: Supervision;From bed Stand to Sit: 5: Supervision;To bed Details for Transfer Assistance: Cues for hand placement and safety Ambulation/Gait Ambulation/Gait Assistance: 4: Min guard;5: Supervision Ambulation Distance (Feet): 220 Feet Assistive device: Rolling walker Ambulation/Gait Assistance Details: Minguard assist progressing to supervision; Noted  a considerable amount of knee buckling initially, which improved with cues to activate quad and practice Gait Pattern: Step-through pattern Stairs: Yes Stairs Assistance: 5: Supervision Stairs Assistance Details (indicate cue type and reason): verbal cues for sequencing Stair Management Technique: No rails;Backwards;With walker Number of Stairs: 1 (x3)    Exercises Total Joint Exercises Quad Sets: AROM;Right;10 reps Heel Slides: AAROM;Right;5 reps Straight Leg Raises: AAROM;Right;10 reps   PT Diagnosis: Difficulty walking;Acute pain  PT Problem List: Decreased strength;Decreased range of motion;Decreased activity tolerance;Decreased balance;Decreased mobility;Decreased knowledge of use of DME;Pain PT Treatment Interventions:       PT Goals(Current goals can be found in the care plan section) Acute Rehab PT Goals Patient Stated Goal: back to hockey PT Goal Formulation: With patient Time For Goal Achievement: 12/11/12 Potential to Achieve Goals: Good  Visit Information  Last PT Received On: 12/04/12 Assistance Needed: +1 History of Present Illness: s/p knee revision       Prior Functioning  Home Living Family/patient expects to be discharged to:: Private residence Living Arrangements: Children Available Help at Discharge: Family;Friend(s);Available PRN/intermittently Type of Home: House Home Access: Stairs to enter [employer] of Steps: 3 (1+1+1) Entrance Stairs-Rails: None Home Layout: One level Home Equipment: Walker - 2 wheels Prior Function Level of Independence: Independent Communication Communication: No difficulties    Cognition  Cognition Arousal/Alertness: Awake/alert Behavior During Therapy: WFL for tasks assessed/performed Overall Cognitive Status: Within Functional Limits for tasks assessed    Extremity/Trunk Assessment Upper Extremity Assessment Upper Extremity Assessment: Overall WFL for tasks assessed Lower Extremity Assessment Lower  Extremity Assessment: RLE deficits/detail RLE Deficits / Details: Grossly decr strength postop, with frequent buckling  of knee in standing   Balance    End of Session PT - End of Session Equipment Utilized During Treatment: Gait belt Activity Tolerance: Patient tolerated treatment well Patient left: in bed;in CPM;with call bell/phone within reach Nurse Communication: Mobility status  GP     Van Clines [facility] [place], [place] [phone]  12/04/2012, 3:47 PM

## 2012-12-05 ENCOUNTER — Encounter (HOSPITAL_COMMUNITY): Payer: Self-pay | Admitting: Orthopedic Surgery

## 2012-12-05 NOTE — Op Note (Signed)
NAMEAQUILES, RUFFINI        ACCOUNT NO.:  [id number]  MEDICAL RECORD NO.:  [id number]  LOCATION:  5N28C                        FACILITY:  [facility]  PHYSICIAN:  Mila Homer. Sherlean Foot, M.D. DATE OF BIRTH:  02/17/1961  DATE OF PROCEDURE:  12/03/2012 DATE OF DISCHARGE:  12/04/2012                              OPERATIVE REPORT   SURGEON:  Mila Homer. Sherlean Foot, MD  ASSISTANT:  Skip Mayer, PA-C  ANESTHESIA:  General.  PREOPERATIVE DIAGNOSIS:  Arthrofibrosis, right knee.  POSTOPERATIVE DIAGNOSIS:  Arthrofibrosis, right knee.  PROCEDURE:  Right knee adhesion removal, open scar tissue debridement.  INDICATION FOR PROCEDURE:  The patient is a 52 year old, now several years status post knee replacement, but with only 0 to 60 degrees of motion.  Informed consent was obtained for potential polyethylene removal, but no full revision.  DESCRIPTION OF PROCEDURE:  The patient was laid in supine position, underwent general anesthesia.  Right leg prepped and draped in usual sterile fashion.  Exsanguinated with the Esmarch and tourniquet inflated to 350 mmHg.  A midline incision was made through the old incision, approximately 10 inches in length.  New blade was used to make an arthrotomy.  There was __________ amount of scar tissue throughout the knee.  I have got all of that out.  I then was able to get through about 70 degrees, removing the scar tissue and the knee joint itself.  I then went out and released the scar tissue in the suprapatellar pouch, and the anterior femur all the way up, probably 20 inches from midpoint to midpoint on the femur as well.  This then allowed me to get approximately 90 degrees and with better pressure back even to 95 degrees of flexion.  At this point, limitation __________ of the quadriceps and patellar tendon.  I elected not to remove the polyethylene since it was a rotating platform, and I would have to dissect all the way around medially and subluxed the  knee anteriorly.  I felt that might create more trauma and then even create more adhesive response after surgery.  Therefore, lavaged __________ we let the tourniquet down since there were lots of bleeding edges to their debridement areas.  Also, I was concerned about the anterior and medial aspect of the femoral component as there was a lot of bleeding there and I felt that there was possibly sign of early loosening on the femoral component.  Tibial component seemed very well fixed.  I did also send out deep tissue cultures and it was negative for infection, and lavaged, closed with interrupted #1 Vicryl, buried 0 Vicryl __________ soft tissue, subcu with 0 Vicryl, and skin staples.  Dressed with Xeroform, dressing sponges, sterile Webril, and Ace wrap.  COMPLICATIONS:  None.  DRAINS:  One Hemovac.  ESTIMATED BLOOD LOSS:  __________.          ______________________________ Mila Homer. Sherlean Foot, M.D.     SDL/MEDQ  D:  12/04/2012  T:  12/05/2012  Job:  [id number]

## 2012-12-06 LAB — TISSUE CULTURE: Gram Stain: NONE SEEN

## 2012-12-07 NOTE — Discharge Summary (Signed)
SPORTS MEDICINE & JOINT REPLACEMENT   Georgena Spurling, MD   Altamese Cabal, PA-C [address] [place], [place], [place]  [address]                             [phone]  PATIENT ID: Jimmy Black        MRN:  [id number]          DOB/AGE: 1960-03-12 / 52 y.o.    DISCHARGE SUMMARY  ADMISSION DATE:    12/03/2012 DISCHARGE DATE: 12/04/2012  ADMISSION DIAGNOSIS: arthrofibrosis right knee    DISCHARGE DIAGNOSIS:  arthrofibrosis right knee    ADDITIONAL DIAGNOSIS: Active Problems:   * No active hospital problems. *  Past Medical History  Diagnosis Date  . Alcohol abuse   . Chicken pox   . Anxiety     takes Paxil daily  . Family history of anesthesia complication     brother got sick after anesthesia  . Hyperlipidemia     takes Fish Oil daily  . Numbness     both leg and feet  . Peripheral neuropathy   . Joint pain   . Joint swelling   . Osteoarthritis     "right knee; both hips" (12/03/2012)    PROCEDURE: Procedure(s): EXCISIONAL TOTAL KNEE ARTHROPLASTY (POLY SWAP), SCAR TISSUE REMOVAL, QUADRICEPSPLASTY on 12/03/2012  CONSULTS:     HISTORY:  See H&P in chart  HOSPITAL COURSE:  Jimmy Black is a 52 y.o. admitted on 12/03/2012 and found to have a diagnosis of arthrofibrosis right knee.  After appropriate laboratory studies were obtained  they were taken to the operating room on 12/03/2012 and underwent Procedure(s): EXCISIONAL TOTAL KNEE ARTHROPLASTY (POLY SWAP), SCAR TISSUE REMOVAL, QUADRICEPSPLASTY.   They were given perioperative antibiotics:  Anti-infectives   Start     Dose/Rate Route Frequency Ordered Stop   12/03/12 1700  ceFAZolin (ANCEF) IVPB 2 g/50 mL premix     2 g 100 mL/hr over 30 Minutes Intravenous Every 6 hours 12/03/12 1527 12/03/12 2311   12/03/12 0600  ceFAZolin (ANCEF) 3 g in dextrose 5 % 50 mL IVPB     3 g 160 mL/hr over 30 Minutes Intravenous On call to O.R. 12/02/12 1333 12/03/12 1154    .  Tolerated the procedure well.   Placed with a foley intraoperatively.  Given Ofirmev at induction and for 48 hours.    POD# 1: Vital signs were stable.  Patient denied Chest pain, shortness of breath, or calf pain.  Patient was started on Lovenox 30 mg subcutaneously twice daily at 8am.  Consults to PT, OT, and care management were made.  The patient was weight bearing as tolerated.  CPM was placed on the operative leg 0-90 degrees for 6-8 hours a day.  Incentive spirometry was taught.  Dressing was changed.  Marcaine pump and hemovac were discontinued.         The remainder of the hospital course was dedicated to ambulation and strengthening.   The patient was discharged on 1 day post op in  Good condition.  Blood products given:none  DIAGNOSTIC STUDIES: Recent vital signs: No data found.      Recent laboratory studies:  Recent Labs  12/03/12 1648 12/04/12 0500  WBC 12.9* 16.1*  HGB 13.0 13.0  HCT 37.4* 36.8*  PLT 212 254    Recent Labs  12/03/12 1648 12/04/12 0500  NA  --  137  K  --  4.3  CL  --  98  CO2  --  29  BUN  --  19  CREATININE 0.81 0.75  GLUCOSE  --  113*  CALCIUM  --  9.6   Lab Results  Component Value Date   INR 0.97 11/28/2012   INR 0.98 11/10/2011     Recent Radiographic Studies :  Dg Chest 2 View  11/28/2012   CLINICAL DATA:  Preop knee surgery.  EXAM: CHEST  2 VIEW  COMPARISON:  November 10, 2011.  FINDINGS: The heart size and mediastinal contours are within normal limits. Both lungs are clear. The visualized skeletal structures are unremarkable.  IMPRESSION: No active cardiopulmonary disease.   Electronically Signed   By: Roque Lias M.D.   On: 11/28/2012 16:21    DISCHARGE INSTRUCTIONS: Discharge Orders   Future Orders Complete By Expires   Call MD / Call 911  As directed    Comments:     If you experience chest pain or shortness of breath, CALL 911 and be transported to the hospital emergency room.  If you develope a fever above 101 F, pus (white drainage) or increased  drainage or redness at the wound, or calf pain, call your surgeon's office.   Change dressing  As directed    Comments:     Change dressing on wednesday, then change the dressing daily with sterile 4 x 4 inch gauze dressing and apply TED hose.   Constipation Prevention  As directed    Comments:     Drink plenty of fluids.  Prune juice may be helpful.  You may use a stool softener, such as Colace (over the counter) 100 mg twice a day.  Use MiraLax (over the counter) for constipation as needed.   CPM  As directed    Comments:     Continuous passive motion machine (CPM):      Use the CPM from 0 to 90 for 6-8 hours per day.      You may increase by 10 per day.  You may break it up into 2 or 3 sessions per day.      Use CPM for 2 weeks or until you are told to stop.   Diet - low sodium heart healthy  As directed    Do not put a pillow under the knee. Place it under the heel.  As directed    Driving restrictions  As directed    Comments:     No driving for 6 weeks   Increase activity slowly as tolerated  As directed    Lifting restrictions  As directed    Comments:     No lifting for 6 weeks   TED hose  As directed    Comments:     Use stockings (TED hose) for 3 weeks on both leg(s).  You may remove them at night for sleeping.      DISCHARGE MEDICATIONS:     Medication List    STOP taking these medications       fish oil-omega-3 fatty acids 1000 MG capsule     oxycodone 5 MG capsule  Commonly known as:  OXY-IR  Replaced by:  oxyCODONE 15 MG immediate release tablet     SUBOXONE 8-2 MG Film  Generic drug:  Buprenorphine HCl-Naloxone HCl      TAKE these medications       ibuprofen 200 MG tablet  Commonly known as:  ADVIL,MOTRIN  Take 600 mg by mouth daily as needed for pain.  MENS MULTI VITAMIN & MINERAL PO  Take 1 tablet by mouth daily.     methocarbamol 500 MG tablet  Commonly known as:  ROBAXIN  Take 1-2 tablets (500-1,000 mg total) by mouth every 6 (six) hours as  needed.     oxyCODONE 15 MG immediate release tablet  Commonly known as:  ROXICODONE  Take 0.5-1 tablets (7.5-15 mg total) by mouth every 4 (four) hours as needed.     OxyCODONE 40 mg T12a 12 hr tablet  Commonly known as:  OXYCONTIN  Take 1 tablet (40 mg total) by mouth every 12 (twelve) hours.     PARoxetine 40 MG tablet  Commonly known as:  PAXIL  Take 40 mg by mouth daily.     Red Yeast Rice 600 MG Tabs  Take 1,200 mg by mouth daily.        FOLLOW UP VISIT:       Follow-up Information   Follow up with [facility] ( Home Health Physical Therapy)    Contact information:   [phone]      Follow up with Raymon Mutton, MD. Call on 12/18/2012.   Specialty:  Orthopedic Surgery   Contact information:   [address] [place] [address] [phone]       DISPOSITION: HOME   CONDITION:  Good   Aylani Spurlock 12/07/2012, 8:39 AM

## 2012-12-17 ENCOUNTER — Encounter (HOSPITAL_COMMUNITY): Payer: Medicare PPO

## 2012-12-17 ENCOUNTER — Other Ambulatory Visit (HOSPITAL_COMMUNITY): Payer: Self-pay | Admitting: Orthopedic Surgery

## 2012-12-17 DIAGNOSIS — M79661 Pain in right lower leg: Secondary | ICD-10-CM

## 2012-12-17 DIAGNOSIS — M7989 Other specified soft tissue disorders: Secondary | ICD-10-CM

## 2012-12-18 ENCOUNTER — Ambulatory Visit (HOSPITAL_COMMUNITY): Admission: RE | Admit: 2012-12-18 | Payer: Medicare PPO | Source: Ambulatory Visit

## 2012-12-21 NOTE — Anesthesia Postprocedure Evaluation (Signed)
  Anesthesia Post-op Note  Patient: Jimmy Black  Procedure(s) Performed: Procedure(s): EXCISIONAL TOTAL KNEE ARTHROPLASTY (POLY SWAP), SCAR TISSUE REMOVAL, QUADRICEPSPLASTY (Right)  Patient Location: PACU  Anesthesia Type:General  Level of Consciousness: awake, alert  and oriented  Airway and Oxygen Therapy: Patient Spontanous Breathing  Post-op Pain: mild  Post-op Assessment: Post-op Vital signs reviewed and Patient's Cardiovascular Status Stable  Post-op Vital Signs: stable  Complications: No apparent anesthesia complications

## 2013-03-01 ENCOUNTER — Ambulatory Visit: Payer: Medicare PPO | Admitting: Family Medicine

## 2013-03-05 ENCOUNTER — Ambulatory Visit (INDEPENDENT_AMBULATORY_CARE_PROVIDER_SITE_OTHER): Payer: Medicare PPO | Admitting: Family Medicine

## 2013-03-05 ENCOUNTER — Encounter: Payer: Self-pay | Admitting: Family Medicine

## 2013-03-05 VITALS — BP 132/68 | HR 75 | Temp 98.2°F | Wt 287.0 lb

## 2013-03-05 DIAGNOSIS — M2669 Other specified disorders of temporomandibular joint: Secondary | ICD-10-CM

## 2013-03-05 DIAGNOSIS — M26629 Arthralgia of temporomandibular joint, unspecified side: Secondary | ICD-10-CM

## 2013-03-05 NOTE — Patient Instructions (Signed)
Temporomandibular Problems  Temporomandibular joint (TMJ) dysfunction means there are problems with the joint between your jaw and your skull. This is a joint lined by cartilage like other joints in your body but also has a small disc in the joint which keeps the bones from rubbing on each other. These joints are like other joints and can get inflamed (sore) from arthritis and other problems. When this joint gets sore, it can cause headaches and pain in the jaw and the face. CAUSES  Usually the arthritic types of problems are caused by soreness in the joint. Soreness in the joint can also be caused by overuse. This may come from grinding your teeth. It may also come from mis-alignment in the joint. DIAGNOSIS Diagnosis of this condition can often be made by history and exam. Sometimes your caregiver may need X-rays or an MRI scan to determine the exact cause. It may be necessary to see your dentist to determine if your teeth and jaws are lined up correctly. TREATMENT  Most of the time this problem is not serious; however, sometimes it can persist (become chronic). When this happens medications that will cut down on inflammation (soreness) help. Sometimes a shot of cortisone into the joint will be helpful. If your teeth are not aligned it may help for your dentist to make a splint for your mouth that can help this problem. If no physical problems can be found, the problem may come from tension. If tension is found to be the cause, biofeedback or relaxation techniques may be helpful. HOME CARE INSTRUCTIONS   Later in the day, applications of ice packs may be helpful. Ice can be used in a plastic bag with a towel around it to prevent frostbite to skin. This may be used about every 2 hours for 20 to 30 minutes, as needed while awake, or as directed by your caregiver.  Only take over-the-counter or prescription medicines for pain, discomfort, or fever as directed by your caregiver.  If physical therapy was  prescribed, follow your caregiver's directions.  Wear mouth appliances as directed if they were given. Document Released: 11/02/2000 Document Revised: 05/02/2011 Document Reviewed: 02/10/2008 [employer] Patient Information 2014 [employer]  

## 2013-03-05 NOTE — Progress Notes (Signed)
Subjective:    Patient ID: Jimmy Black, male    DOB: 1961-02-10, 53 y.o.   MRN: [id number]  HPI  Patient seen with right jaw pain. Onset about one week ago. No known injury. Pain is described as sharp and worse with chewing and opening and closing the jaw. Occasional throbbing pain. He's had occasional headaches. No skin rash. No tooth pain. Symptoms are moderate in severity. He has taken Advil without relief. Exacerbated by opening and closing of the jaw. No pain at rest. No known history of TMJ symptoms. No recent rash. No ear pain. No difficulty swallowing.  Past Medical History  Diagnosis Date  . Alcohol abuse   . Chicken pox   . Anxiety     takes Paxil daily  . Family history of anesthesia complication     brother got sick after anesthesia  . Hyperlipidemia     takes Fish Oil daily  . Numbness     both leg and feet  . Peripheral neuropathy   . Joint pain   . Joint swelling   . Osteoarthritis     "right knee; both hips" (12/03/2012)   Past Surgical History  Procedure Laterality Date  . Knee surgery Right 1967, 1973, 1980    "benign tumor removal" (12/03/2012)  . Shoulder arthroscopy with open rotator cuff repair Right 2006  . Total knee arthroplasty  11/16/2011    Procedure: TOTAL KNEE ARTHROPLASTY;  Surgeon: Kerin Salen, MD;  Location: [facility];  Service: Orthopedics;  Laterality: Right;  right total knee arthroplasty  . Shoulder arthroscopy with open rotator cuff repair Left 06/2012  . Incision and drainage of wound Right 2006    "w/jet lavage; had to do this twice after the OR" (12/03/2012)  . Total knee revision with scar debridement/patella revision with poly exchange Right 12/03/2012    "not sure what they did exactly" (10/'13/2014)  . Excisional total knee arthroplasty Right 12/03/2012    Procedure: EXCISIONAL TOTAL KNEE ARTHROPLASTY (POLY SWAP), SCAR TISSUE REMOVAL, QUADRICEPSPLASTY;  Surgeon: Vickey Huger, MD;  Location: [facility];  Service: Orthopedics;   Laterality: Right;    reports that he has never smoked. He has quit using smokeless tobacco. His smokeless tobacco use included Snuff and Chew. He reports that he drinks alcohol. He reports that he does not use illicit drugs. family history includes Alcohol abuse in his paternal aunt and paternal grandfather; Cancer in his father and maternal aunt; Cancer (age of onset: 26) in his mother; Heart disease in his maternal grandfather, mother, and paternal grandfather; Hyperlipidemia in his brother and mother. No Known Allergies   Review of Systems  Constitutional: Negative for fever and chills.  HENT: Negative for facial swelling and trouble swallowing.   Cardiovascular: Negative for chest pain.  Skin: Negative for rash.  Hematological: Negative for adenopathy.       Objective:   Physical Exam  Constitutional: He appears well-developed and well-nourished.  HENT:  Head: Normocephalic and atraumatic.  Right Ear: External ear normal.  Left Ear: External ear normal.  Mouth/Throat: Oropharynx is clear and moist.  Patient has minimal tenderness to palpation right TMJ region. No facial masses. No neck adenopathy.  Neck: Neck supple. No thyromegaly present.  Cardiovascular: Normal rate.   Pulmonary/Chest: Effort normal and breath sounds normal. No respiratory distress. He has no wheezes. He has no rales.  Lymphadenopathy:    He has no cervical adenopathy.  Skin: No rash noted.  Assessment & Plan:  Right facial pain. 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 We've recommended avoiding hard to chew foods, try over-the-counter anti-inflammatory such as Advil or Aleve. He does not describe any bruxism. Follow for now. If not improving in the next few weeks consider referral to oral surgeon or trial physical therapy

## 2013-03-05 NOTE — Progress Notes (Signed)
Pre visit review using our clinic review tool, if applicable. No additional management support is needed unless otherwise documented below in the visit note. 

## 2013-12-24 ENCOUNTER — Ambulatory Visit (INDEPENDENT_AMBULATORY_CARE_PROVIDER_SITE_OTHER): Payer: Medicare PPO | Admitting: Internal Medicine

## 2013-12-24 ENCOUNTER — Encounter: Payer: Self-pay | Admitting: Internal Medicine

## 2013-12-24 VITALS — BP 130/90 | HR 62 | Temp 98.3°F | Resp 20 | Ht 78.0 in | Wt 292.0 lb

## 2013-12-24 DIAGNOSIS — G4733 Obstructive sleep apnea (adult) (pediatric): Secondary | ICD-10-CM

## 2013-12-24 DIAGNOSIS — M25561 Pain in right knee: Secondary | ICD-10-CM

## 2013-12-24 DIAGNOSIS — F411 Generalized anxiety disorder: Secondary | ICD-10-CM

## 2013-12-24 DIAGNOSIS — Z23 Encounter for immunization: Secondary | ICD-10-CM

## 2013-12-24 DIAGNOSIS — R42 Dizziness and giddiness: Secondary | ICD-10-CM

## 2013-12-24 NOTE — Progress Notes (Signed)
Pre visit review using our clinic review tool, if applicable. No additional management support is needed unless otherwise documented below in the visit note. 

## 2013-12-24 NOTE — Patient Instructions (Signed)
Call or return to clinic prn if these symptoms worsen or fail to improve as anticipated.  Home sleep study as discussed

## 2013-12-24 NOTE — Progress Notes (Signed)
Subjective:    Patient ID: Jimmy Black, male    DOB: 1960/06/24, 53 y.o.   MRN: [id number]  HPI 53 year old patient who has a history of arthritis affecting multiple joints who has been on chronic narcotic medication for a number of years. For the past 2 days she has had some dizziness and some mild dull pain of the posterior neck area.  The dizziness is mild and described as a floating sensation.  No associated nausea or visual disturbances.  No similar episodes in the past He has a history of significant weight gain, daytime sleepiness.  His spouse describes episodes of recent apnea and loud snoring  Past Medical History  Diagnosis Date  . Alcohol abuse   . Chicken pox   . Anxiety     takes Paxil daily  . Family history of anesthesia complication     brother got sick after anesthesia  . Hyperlipidemia     takes Fish Oil daily  . Numbness     both leg and feet  . Peripheral neuropathy   . Joint pain   . Joint swelling   . Osteoarthritis     "right knee; both hips" (12/03/2012)    History   Social History  . Marital Status: Divorced    Spouse Name: N/A    Number of Children: N/A  . Years of Education: N/A   Occupational History  . Not on file.   Social History Main Topics  . Smoking status: Never Smoker   . Smokeless tobacco: Former Systems developer    Types: Snuff, Chew     Comment: 12/03/2012 "stopped smokeless tobacco in the early 1990's"  . Alcohol Use: Yes     Comment: 12/03/2012 "quit all alcohol in 1998; family hx of problems w/it"  . Drug Use: No  . Sexual Activity: Not Currently   Other Topics Concern  . Not on file   Social History Narrative    Past Surgical History  Procedure Laterality Date  . Knee surgery Right 1967, 1973, 1980    "benign tumor removal" (12/03/2012)  . Shoulder arthroscopy with open rotator cuff repair Right 2006  . Total knee arthroplasty  11/16/2011    Procedure: TOTAL KNEE ARTHROPLASTY;  Surgeon: Kerin Salen, MD;   Location: [facility];  Service: Orthopedics;  Laterality: Right;  right total knee arthroplasty  . Shoulder arthroscopy with open rotator cuff repair Left 06/2012  . Incision and drainage of wound Right 2006    "w/jet lavage; had to do this twice after the OR" (12/03/2012)  . Total knee revision with scar debridement/patella revision with poly exchange Right 12/03/2012    "not sure what they did exactly" (10/'13/2014)  . Excisional total knee arthroplasty Right 12/03/2012    Procedure: EXCISIONAL TOTAL KNEE ARTHROPLASTY (POLY SWAP), SCAR TISSUE REMOVAL, QUADRICEPSPLASTY;  Surgeon: Vickey Huger, MD;  Location: [facility];  Service: Orthopedics;  Laterality: Right;    Family History  Problem Relation Age of Onset  . Cancer Mother 79    breast  . Hyperlipidemia Mother   . Heart disease Mother   . Cancer Father     prostate  . Hyperlipidemia Brother   . Cancer Maternal Aunt     breast  . Alcohol abuse Paternal Aunt   . Heart disease Maternal Grandfather   . Alcohol abuse Paternal Grandfather   . Heart disease Paternal Grandfather     No Known Allergies  Current Outpatient Prescriptions on File Prior to Visit  Medication Sig Dispense  Refill  . ibuprofen (ADVIL,MOTRIN) 200 MG tablet Take 600 mg by mouth daily as needed for pain.    . Multiple Vitamins-Minerals (MENS MULTI VITAMIN & MINERAL PO) Take 1 tablet by mouth daily.     Marland Kitchen PARoxetine (PAXIL) 40 MG tablet Take 40 mg by mouth daily.      No current facility-administered medications on file prior to visit.    BP 130/90 mmHg  Pulse 62  Temp(Src) 98.3 F (36.8 C) (Oral)  Resp 20  Ht 6\' 6"  (1.981 m)  Wt 292 lb (132.45 kg)  BMI 33.75 kg/m2  SpO2 98%      Review of Systems  Constitutional: Negative for fever, chills, appetite change and fatigue.  HENT: Negative for congestion, dental problem, ear pain, hearing loss, sore throat, tinnitus, trouble swallowing and voice change.   Eyes: Negative for pain, discharge and visual  disturbance.  Respiratory: Negative for cough, chest tightness, wheezing and stridor.   Cardiovascular: Negative for chest pain, palpitations and leg swelling.  Gastrointestinal: Negative for nausea, vomiting, abdominal pain, diarrhea, constipation, blood in stool, abdominal distention and rectal pain.  Genitourinary: Negative for urgency, hematuria, flank pain, discharge, difficulty urinating and genital sores.  Musculoskeletal: Positive for arthralgias and neck pain. Negative for myalgias, back pain, joint swelling, gait problem and neck stiffness.  Skin: Negative for rash.  Neurological: Positive for dizziness, light-headedness and headaches. Negative for syncope, speech difficulty, weakness and numbness.  Hematological: Negative for adenopathy. Does not bruise/bleed easily.  Psychiatric/Behavioral: Negative for behavioral problems and dysphoric mood. The patient is not nervous/anxious.        Objective:   Physical Exam  Constitutional: He is oriented to person, place, and time. He appears well-developed.  HENT:  Head: Normocephalic.  Right Ear: External ear normal.  Left Ear: External ear normal.  Prominent uvula, but no significant pharyngeal crowding  Eyes: Conjunctivae and EOM are normal. Pupils are equal, round, and reactive to light.  Neck: Normal range of motion. Neck supple.  Cardiovascular: Normal rate and normal heart sounds.   Pulmonary/Chest: Breath sounds normal.  Abdominal: Bowel sounds are normal.  Musculoskeletal: Normal range of motion. He exhibits no edema or tenderness.  Lymphadenopathy:    He has no cervical adenopathy.  Neurological: He is alert and oriented to person, place, and time. He has normal reflexes. No cranial nerve deficit. Coordination normal.  Normal gait Finger-to-nose testing normal   Psychiatric: He has a normal mood and affect. His behavior is normal.          Assessment & Plan:   Nonspecific dizziness Normal neuro examination Rule  out OSA Chronic pain syndrome Narcotic dependence  We'll set up for a home sleep study.  The patient will be observed and will report any new or worsening symptoms.  Will hold imaging studies at this time unless clinical change Follow-up chronic pain management Weight loss encouraged No new medications prescribed today

## 2014-02-05 ENCOUNTER — Institutional Professional Consult (permissible substitution): Payer: Medicare PPO | Admitting: Pulmonary Disease

## 2014-02-06 ENCOUNTER — Other Ambulatory Visit: Payer: Medicare PPO

## 2014-02-12 ENCOUNTER — Encounter: Payer: Medicare PPO | Admitting: Family Medicine

## 2014-03-04 ENCOUNTER — Other Ambulatory Visit: Payer: Medicare PPO

## 2014-03-05 ENCOUNTER — Institutional Professional Consult (permissible substitution): Payer: Medicare PPO | Admitting: Pulmonary Disease

## 2014-03-11 ENCOUNTER — Encounter: Payer: Medicare PPO | Admitting: Family Medicine

## 2014-05-06 ENCOUNTER — Encounter: Payer: Self-pay | Admitting: Family Medicine

## 2014-05-06 ENCOUNTER — Ambulatory Visit (INDEPENDENT_AMBULATORY_CARE_PROVIDER_SITE_OTHER): Payer: Medicare PPO | Admitting: Family Medicine

## 2014-05-06 VITALS — BP 128/80 | HR 81 | Temp 98.6°F | Wt 285.0 lb

## 2014-05-06 DIAGNOSIS — N508 Other specified disorders of male genital organs: Secondary | ICD-10-CM

## 2014-05-06 DIAGNOSIS — N5089 Other specified disorders of the male genital organs: Secondary | ICD-10-CM

## 2014-05-06 NOTE — Patient Instructions (Signed)
Schedule complete physical at some point this year

## 2014-05-06 NOTE — Progress Notes (Signed)
Pre visit review using our clinic review tool, if applicable. No additional management support is needed unless otherwise documented below in the visit note. 

## 2014-05-06 NOTE — Progress Notes (Signed)
   Subjective:    Patient ID: Jimmy Black, male    DOB: 1960/11/25, 54 y.o.   MRN: [id number]  HPI Patient seen with left scrotal swelling. He just noticed this incidentally on exam yesterday. Minimal discomfort. No recent injury. No history of hernia. No dysuria. He describes minimal dull discomfort only with palpation. No difficulties with ambulation. No recent dysuria.  Past Medical History  Diagnosis Date  . Alcohol abuse   . Chicken pox   . Anxiety     takes Paxil daily  . Family history of anesthesia complication     brother got sick after anesthesia  . Hyperlipidemia     takes Fish Oil daily  . Numbness     both leg and feet  . Peripheral neuropathy   . Joint pain   . Joint swelling   . Osteoarthritis     "right knee; both hips" (12/03/2012)   Past Surgical History  Procedure Laterality Date  . Knee surgery Right 1967, 1973, 1980    "benign tumor removal" (12/03/2012)  . Shoulder arthroscopy with open rotator cuff repair Right 2006  . Total knee arthroplasty  11/16/2011    Procedure: TOTAL KNEE ARTHROPLASTY;  Surgeon: Kerin Salen, MD;  Location: [facility];  Service: Orthopedics;  Laterality: Right;  right total knee arthroplasty  . Shoulder arthroscopy with open rotator cuff repair Left 06/2012  . Incision and drainage of wound Right 2006    "w/jet lavage; had to do this twice after the OR" (12/03/2012)  . Total knee revision with scar debridement/patella revision with poly exchange Right 12/03/2012    "not sure what they did exactly" (10/'13/2014)  . Excisional total knee arthroplasty Right 12/03/2012    Procedure: EXCISIONAL TOTAL KNEE ARTHROPLASTY (POLY SWAP), SCAR TISSUE REMOVAL, QUADRICEPSPLASTY;  Surgeon: Vickey Huger, MD;  Location: [facility];  Service: Orthopedics;  Laterality: Right;    reports that he has never smoked. He has quit using smokeless tobacco. His smokeless tobacco use included Snuff and Chew. He reports that he drinks alcohol. He reports that he  does not use illicit drugs. family history includes Alcohol abuse in his paternal aunt and paternal grandfather; Cancer in his father and maternal aunt; Cancer (age of onset: 9) in his mother; Heart disease in his maternal grandfather, mother, and paternal grandfather; Hyperlipidemia in his brother and mother. No Known Allergies    Review of Systems  Constitutional: Negative for fever, chills, appetite change and unexpected weight change.  Genitourinary: Positive for scrotal swelling. Negative for dysuria, urgency and penile swelling.       Objective:   Physical Exam  Constitutional: He appears well-developed and well-nourished.  Cardiovascular: Normal rate and regular rhythm.   Pulmonary/Chest: Effort normal and breath sounds normal. No respiratory distress. He has no wheezes. He has no rales.  Genitourinary:  Right testicle is normal. Left testicle is nontender with no mass. He has some left scrotal swelling. This is fairly diffuse but no distinct masses noted. Could not appreciate any definite hernial mass when palpating through the scrotum.          Assessment & Plan:  Left scrotal mass. Differential is hydrocele versus hernia versus less likely large spermatocele.  Set up ultrasound of scrotum to further assess. Patient is also encouraged to schedule complete physical.

## 2014-05-09 ENCOUNTER — Ambulatory Visit
Admission: RE | Admit: 2014-05-09 | Discharge: 2014-05-09 | Disposition: A | Discharge status: Home / Self Care | Payer: Medicare PPO | Source: Ambulatory Visit | Attending: Family Medicine | Admitting: Family Medicine

## 2014-05-09 DIAGNOSIS — N5089 Other specified disorders of the male genital organs: Secondary | ICD-10-CM

## 2014-05-25 ENCOUNTER — Encounter: Payer: Self-pay | Admitting: Family Medicine

## 2014-05-27 ENCOUNTER — Other Ambulatory Visit: Payer: Self-pay | Admitting: Family Medicine

## 2014-05-27 DIAGNOSIS — N503 Cyst of epididymis: Secondary | ICD-10-CM

## 2014-05-29 ENCOUNTER — Encounter: Payer: Self-pay | Admitting: Family Medicine

## 2014-05-30 ENCOUNTER — Encounter: Payer: Self-pay | Admitting: Family Medicine

## 2014-06-16 ENCOUNTER — Encounter: Payer: Medicare PPO | Admitting: Family Medicine

## 2014-06-18 DIAGNOSIS — M25552 Pain in left hip: Secondary | ICD-10-CM | POA: Diagnosis not present

## 2014-07-04 ENCOUNTER — Encounter: Payer: Self-pay | Admitting: Family Medicine

## 2014-08-30 DIAGNOSIS — F339 Major depressive disorder, recurrent, unspecified: Secondary | ICD-10-CM | POA: Diagnosis not present

## 2014-10-16 ENCOUNTER — Other Ambulatory Visit (INDEPENDENT_AMBULATORY_CARE_PROVIDER_SITE_OTHER): Payer: Medicare PPO

## 2014-10-16 DIAGNOSIS — E785 Hyperlipidemia, unspecified: Secondary | ICD-10-CM | POA: Diagnosis not present

## 2014-10-16 DIAGNOSIS — Z0000 Encounter for general adult medical examination without abnormal findings: Secondary | ICD-10-CM | POA: Diagnosis not present

## 2014-10-16 DIAGNOSIS — R7989 Other specified abnormal findings of blood chemistry: Secondary | ICD-10-CM

## 2014-10-16 LAB — HEPATIC FUNCTION PANEL
ALT: 31 U/L (ref 0–53)
AST: 25 U/L (ref 0–37)
Albumin: 4.6 g/dL (ref 3.5–5.2)
Alkaline Phosphatase: 68 U/L (ref 39–117)
BILIRUBIN TOTAL: 0.7 mg/dL (ref 0.2–1.2)
Bilirubin, Direct: 0.1 mg/dL (ref 0.0–0.3)
Total Protein: 7.2 g/dL (ref 6.0–8.3)

## 2014-10-16 LAB — BASIC METABOLIC PANEL
BUN: 16 mg/dL (ref 6–23)
CO2: 30 mEq/L (ref 19–32)
Calcium: 9.9 mg/dL (ref 8.4–10.5)
Chloride: 102 mEq/L (ref 96–112)
Creatinine, Ser: 0.75 mg/dL (ref 0.40–1.50)
GFR: 116.23 mL/min (ref >60.00)
Glucose, Bld: 88 mg/dL (ref 70–99)
Potassium: 4.6 mEq/L (ref 3.5–5.1)
Sodium: 139 mEq/L (ref 135–145)

## 2014-10-16 LAB — CBC WITH DIFFERENTIAL/PLATELET
Basophils Absolute: 0 10*3/uL (ref 0.0–0.1)
Basophils Relative: 0.5 % (ref 0.0–3.0)
Eosinophils Absolute: 0.2 10*3/uL (ref 0.0–0.7)
Eosinophils Relative: 2.3 % (ref 0.0–5.0)
HCT: 46 % (ref 39.0–52.0)
HEMOGLOBIN: 15.6 g/dL (ref 13.0–17.0)
LYMPHS ABS: 2.5 10*3/uL (ref 0.7–4.0)
Lymphocytes Relative: 31 % (ref 12.0–46.0)
MCHC: 34 g/dL (ref 30.0–36.0)
MCV: 89.3 fl (ref 78.0–100.0)
MONO ABS: 0.5 10*3/uL (ref 0.1–1.0)
Monocytes Relative: 6.4 % (ref 3.0–12.0)
NEUTROS PCT: 59.8 % (ref 43.0–77.0)
Neutro Abs: 4.8 10*3/uL (ref 1.4–7.7)
Platelets: 249 10*3/uL (ref 150.0–400.0)
RBC: 5.15 Mil/uL (ref 4.22–5.81)
RDW: 12.6 % (ref 11.5–15.5)
WBC: 8.1 10*3/uL (ref 4.0–10.5)

## 2014-10-16 LAB — LIPID PANEL
CHOL/HDL RATIO: 7
CHOLESTEROL: 232 mg/dL — AB (ref 0–200)
HDL: 32.7 mg/dL — ABNORMAL LOW (ref >39.00)
NonHDL: 199.26
Triglycerides: 228 mg/dL — ABNORMAL HIGH (ref 0.0–149.0)
VLDL: 45.6 mg/dL — AB (ref 0.0–40.0)

## 2014-10-16 LAB — LDL CHOLESTEROL, DIRECT: Direct LDL: 137 mg/dL

## 2014-10-16 LAB — PSA: PSA: 0.54 ng/mL (ref 0.10–4.00)

## 2014-10-16 LAB — TSH: TSH: 0.94 u[IU]/mL (ref 0.35–4.50)

## 2014-10-17 ENCOUNTER — Other Ambulatory Visit: Payer: Self-pay

## 2014-10-24 ENCOUNTER — Encounter: Payer: Medicare PPO | Admitting: Family Medicine

## 2014-10-25 ENCOUNTER — Encounter (INDEPENDENT_AMBULATORY_CARE_PROVIDER_SITE_OTHER): Payer: Self-pay

## 2014-10-28 ENCOUNTER — Encounter: Payer: Medicare PPO | Admitting: Family Medicine

## 2014-11-01 DIAGNOSIS — F339 Major depressive disorder, recurrent, unspecified: Secondary | ICD-10-CM | POA: Diagnosis not present

## 2014-11-06 ENCOUNTER — Encounter: Payer: Self-pay | Admitting: Family Medicine

## 2014-11-06 ENCOUNTER — Ambulatory Visit (INDEPENDENT_AMBULATORY_CARE_PROVIDER_SITE_OTHER): Payer: Medicare PPO | Admitting: Family Medicine

## 2014-11-06 VITALS — BP 130/90 | HR 63 | Temp 97.9°F | Ht 78.35 in | Wt 286.6 lb

## 2014-11-06 DIAGNOSIS — Z0000 Encounter for general adult medical examination without abnormal findings: Secondary | ICD-10-CM | POA: Diagnosis not present

## 2014-11-06 DIAGNOSIS — E785 Hyperlipidemia, unspecified: Secondary | ICD-10-CM | POA: Diagnosis not present

## 2014-11-06 DIAGNOSIS — R0683 Snoring: Secondary | ICD-10-CM | POA: Diagnosis not present

## 2014-11-06 DIAGNOSIS — Z23 Encounter for immunization: Secondary | ICD-10-CM | POA: Diagnosis not present

## 2014-11-06 MED ORDER — ROSUVASTATIN CALCIUM 20 MG PO TABS: 20.0000 mg | ORAL_TABLET | Freq: Every day | ORAL | Status: DC

## 2014-11-06 NOTE — Patient Instructions (Signed)
Coronary Calcium Scan A coronary calcium scan is an imaging test used to look for deposits of calcium and other fatty materials (plaques) in the inner lining of the blood vessels of your heart (coronary arteries). These deposits of calcium and plaques can partly clog and narrow the coronary arteries without producing any symptoms or warning signs. This puts you at risk for a heart attack. This test can detect these deposits before symptoms develop.  LET [facility] CARE PROVIDER KNOW ABOUT:  Any allergies you have.  All medicines you are taking, including vitamins, herbs, eye drops, creams, and over-the-counter medicines.  Previous problems you or members of your family have had with the use of anesthetics.  Any blood disorders you have.  Previous surgeries you have had.  Medical conditions you have.  Possibility of pregnancy, if this applies. RISKS AND COMPLICATIONS Generally, this is a safe procedure. However, as with any procedure, complications can occur. This test involves the use of radiation. Radiation exposure can be dangerous to a pregnant woman and her unborn baby. If you are pregnant, you should not have this procedure done.  BEFORE THE PROCEDURE There is no special preparation for the procedure. PROCEDURE  You will need to undress and put on a hospital gown. You will need to remove any jewelry around your neck or chest.  Sticky electrodes are placed on your chest and are connected to an electrocardiogram (EKG or electrocardiography) machine to recorda tracing of the electrical activity of your heart.  A CT scanner will take pictures of your heart. During this time, you will be asked to lie still and hold your breath for 2-3 seconds while a picture is being taken of your heart. AFTER THE PROCEDURE   You will be allowed to get dressed.  You can return to your normal activities after the scan is done. Document Released: 08/06/2007 Document Revised: 02/12/2013 Document  Reviewed: 10/15/2012 [facility] Patient Information 2015 [place], [place]. This information is not intended to replace advice given to you by your health care provider. Make sure you discuss any questions you have with your health care provider.  Schedule follow up labs in 2 months after starting the Crestor.

## 2014-11-06 NOTE — Progress Notes (Signed)
Pre visit review using our clinic review tool, if applicable. No additional management support is needed unless otherwise documented below in the visit note. 

## 2014-11-06 NOTE — Progress Notes (Signed)
Subjective:    Patient ID: Jimmy Black, male    DOB: 12/31/1960, 54 y.o.   MRN: [id number]  HPI   Patient here for complete physical.  He's had multiple orthopedic issues and also has bilateral neuropathy lower extremities previously worked up by neurologist. He has past history of alcohol abuse. He remains on Suboxone which is prescribed elsewhere. Turned 52 couple years ago but has not had screening colonoscopy. Nonsmoker.  Patient states he has loud snoring at night and family members have observed apnea. He has occasional fatigue and daytime somnolence.  Positive family history of premature CAD. He has not had any recent chest pains.  Past Medical History  Diagnosis Date  . Alcohol abuse   . Chicken pox   . Anxiety     takes Paxil daily  . Family history of anesthesia complication     brother got sick after anesthesia  . Hyperlipidemia     takes Fish Oil daily  . Numbness     both leg and feet  . Peripheral neuropathy   . Joint pain   . Joint swelling   . Osteoarthritis     "right knee; both hips" (12/03/2012)   Past Surgical History  Procedure Laterality Date  . Knee surgery Right 1967, 1973, 1980    "benign tumor removal" (12/03/2012)  . Shoulder arthroscopy with open rotator cuff repair Right 2006  . Total knee arthroplasty  11/16/2011    Procedure: TOTAL KNEE ARTHROPLASTY;  Surgeon: Kerin Salen, MD;  Location: [place];  Service: Orthopedics;  Laterality: Right;  right total knee arthroplasty  . Shoulder arthroscopy with open rotator cuff repair Left 06/2012  . Incision and drainage of wound Right 2006    "w/jet lavage; had to do this twice after the OR" (12/03/2012)  . Total knee revision with scar debridement/patella revision with poly exchange Right 12/03/2012    "not sure what they did exactly" (10/'13/2014)  . Excisional total knee arthroplasty Right 12/03/2012    Procedure: EXCISIONAL TOTAL KNEE ARTHROPLASTY (POLY SWAP), SCAR TISSUE REMOVAL,  QUADRICEPSPLASTY;  Surgeon: Vickey Huger, MD;  Location: [place];  Service: Orthopedics;  Laterality: Right;    reports that he has never smoked. He has quit using smokeless tobacco. His smokeless tobacco use included Snuff and Chew. He reports that he drinks alcohol. He reports that he does not use illicit drugs. family history includes Alcohol abuse in his paternal aunt and paternal grandfather; Cancer in his father and maternal aunt; Cancer (age of onset: 66) in his mother; Heart disease in his maternal grandfather, mother, and paternal grandfather; Hyperlipidemia in his brother and mother. No Known Allergies    Review of Systems  Constitutional: Negative for fever, activity change, appetite change and fatigue.  HENT: Negative for congestion, ear pain and trouble swallowing.   Eyes: Negative for pain and visual disturbance.  Respiratory: Negative for cough, shortness of breath and wheezing.   Cardiovascular: Negative for chest pain and palpitations.  Gastrointestinal: Negative for nausea, vomiting, abdominal pain, diarrhea, constipation, blood in stool, abdominal distention and rectal pain.  Endocrine: Negative for polydipsia and polyuria.  Genitourinary: Negative for dysuria, hematuria and testicular pain.  Musculoskeletal: Positive for arthralgias. Negative for joint swelling.  Skin: Negative for rash.  Neurological: Negative for dizziness, syncope and headaches.  Hematological: Negative for adenopathy.  Psychiatric/Behavioral: Negative for confusion and dysphoric mood.       Objective:   Physical Exam  Constitutional: He is oriented to person, place, and time.  He appears well-developed and well-nourished. No distress.  HENT:  Head: Normocephalic and atraumatic.  Right Ear: External ear normal.  Left Ear: External ear normal.  Mouth/Throat: Oropharynx is clear and moist.  Eyes: Conjunctivae and EOM are normal. Pupils are equal, round, and reactive to light.  Neck: Normal range of  motion. Neck supple. No thyromegaly present.  Cardiovascular: Normal rate, regular rhythm and normal heart sounds.   No murmur heard. Pulmonary/Chest: No respiratory distress. He has no wheezes. He has no rales.  Abdominal: Soft. Bowel sounds are normal. He exhibits no distension and no mass. There is no tenderness. There is no rebound and no guarding.  Musculoskeletal: He exhibits no edema.  Lymphadenopathy:    He has no cervical adenopathy.  Neurological: He is alert and oriented to person, place, and time. He displays normal reflexes. No cranial nerve deficit.  Skin: No rash noted.  Psychiatric: He has a normal mood and affect.          Assessment & Plan:  #1 health maintenance. Flu vaccine given. Encouraged to lose some weight. He plans to start more diligent exercise soon. Schedule screening colonoscopy. #2 dyslipidemia. 10 year risk of CAD event 7.8%. Discussed pros and cons of statin use. Patient is interested in more aggressive approach to lipids and we started Crestor 20 mg once daily. Repeat lipids and hepatic in 6-8 weeks  #3 possible obstructive sleep apnea.  Epworth sleep study only 5 but patient states he is having more disordered sleep and would like sleep study to further evaluate. Will set up referral  #4 positive family history of premature CAD. Patient would like to consider coronary calcium score. He will check with insurance first

## 2014-12-10 ENCOUNTER — Encounter: Payer: Self-pay | Admitting: Gastroenterology

## 2014-12-27 DIAGNOSIS — F339 Major depressive disorder, recurrent, unspecified: Secondary | ICD-10-CM | POA: Diagnosis not present

## 2015-01-06 ENCOUNTER — Other Ambulatory Visit: Payer: Self-pay

## 2015-01-20 ENCOUNTER — Other Ambulatory Visit: Payer: Self-pay

## 2015-02-10 ENCOUNTER — Encounter: Payer: Self-pay | Admitting: Gastroenterology

## 2015-03-12 ENCOUNTER — Institutional Professional Consult (permissible substitution): Payer: Medicare PPO | Admitting: Internal Medicine

## 2015-03-19 DIAGNOSIS — Z96651 Presence of right artificial knee joint: Secondary | ICD-10-CM | POA: Insufficient documentation

## 2015-04-16 ENCOUNTER — Other Ambulatory Visit: Payer: Self-pay | Admitting: General Surgery

## 2015-04-16 DIAGNOSIS — N433 Hydrocele, unspecified: Secondary | ICD-10-CM

## 2015-04-16 DIAGNOSIS — R609 Edema, unspecified: Secondary | ICD-10-CM

## 2015-04-16 DIAGNOSIS — N50819 Testicular pain, unspecified: Secondary | ICD-10-CM

## 2015-04-21 ENCOUNTER — Ambulatory Visit
Admission: RE | Admit: 2015-04-21 | Discharge: 2015-04-21 | Disposition: A | Discharge status: Home / Self Care | Payer: Medicare Other | Source: Ambulatory Visit | Attending: General Surgery | Admitting: General Surgery

## 2015-04-21 DIAGNOSIS — N433 Hydrocele, unspecified: Secondary | ICD-10-CM

## 2015-04-21 DIAGNOSIS — R609 Edema, unspecified: Secondary | ICD-10-CM

## 2015-04-21 DIAGNOSIS — N50819 Testicular pain, unspecified: Secondary | ICD-10-CM

## 2015-06-11 ENCOUNTER — Ambulatory Visit (INDEPENDENT_AMBULATORY_CARE_PROVIDER_SITE_OTHER): Payer: Medicare Other | Admitting: Family Medicine

## 2015-06-11 VITALS — BP 130/100 | HR 80 | Temp 98.2°F | Ht 78.0 in | Wt 276.0 lb

## 2015-06-11 DIAGNOSIS — R030 Elevated blood-pressure reading, without diagnosis of hypertension: Secondary | ICD-10-CM

## 2015-06-11 DIAGNOSIS — S199XXA Unspecified injury of neck, initial encounter: Secondary | ICD-10-CM | POA: Insufficient documentation

## 2015-06-11 DIAGNOSIS — IMO0001 Reserved for inherently not codable concepts without codable children: Secondary | ICD-10-CM

## 2015-06-11 NOTE — Progress Notes (Signed)
Subjective:    Patient ID: Jimmy Black, male    DOB: 07-08-1960, 55 y.o.   MRN: [id number]  HPI  Patient seen for concern regarding recent elevated blood pressure. Never treated. Was at orthopedic office earlier today and had initial blood pressure reading of 207/97 with repeat 197/87 (automated cuff). He has not had any headaches or dizziness. No chest pains. No alcohol use. Has recently been trying to lose some weight and watching diet carefully. Takes about 8 over-the-counter Advil per day.   Generally feels well. No peripheral edema issues. He has severe arthritis involving multiple joints.  Past Medical History  Diagnosis Date  . Alcohol abuse   . Chicken pox   . Anxiety     takes Paxil daily  . Family history of anesthesia complication     brother got sick after anesthesia  . Hyperlipidemia     takes Fish Oil daily  . Numbness     both leg and feet  . Peripheral neuropathy   . Joint pain   . Joint swelling   . Osteoarthritis     "right knee; both hips" (12/03/2012)   Past Surgical History  Procedure Laterality Date  . Knee surgery Right 1967, 1973, 1980    "benign tumor removal" (12/03/2012)  . Shoulder arthroscopy with open rotator cuff repair Right 2006  . Total knee arthroplasty  11/16/2011    Procedure: TOTAL KNEE ARTHROPLASTY;  Surgeon: Kerin Salen, MD;  Location: [facility];  Service: Orthopedics;  Laterality: Right;  right total knee arthroplasty  . Shoulder arthroscopy with open rotator cuff repair Left 06/2012  . Incision and drainage of wound Right 2006    "w/jet lavage; had to do this twice after the OR" (12/03/2012)  . Total knee revision with scar debridement/patella revision with poly exchange Right 12/03/2012    "not sure what they did exactly" (10/'13/2014)  . Excisional total knee arthroplasty Right 12/03/2012    Procedure: EXCISIONAL TOTAL KNEE ARTHROPLASTY (POLY SWAP), SCAR TISSUE REMOVAL, QUADRICEPSPLASTY;  Surgeon: Vickey Huger, MD;  Location:  [place];  Service: Orthopedics;  Laterality: Right;    reports that he has never smoked. He has quit using smokeless tobacco. His smokeless tobacco use included Snuff and Chew. He reports that he drinks alcohol. He reports that he does not use illicit drugs. family history includes Alcohol abuse in his paternal aunt and paternal grandfather; Cancer in his father and maternal aunt; Cancer (age of onset: 69) in his mother; Heart disease in his maternal grandfather, mother, and paternal grandfather; Hyperlipidemia in his brother and mother. No Known Allergies    Review of Systems  Constitutional: Negative for fatigue.  Eyes: Negative for visual disturbance.  Respiratory: Negative for cough, chest tightness and shortness of breath.   Cardiovascular: Negative for chest pain and palpitations.  Neurological: Negative for dizziness, syncope, weakness, light-headedness and headaches.       Objective:   Physical Exam  Constitutional: He is oriented to person, place, and time. He appears well-developed and well-nourished.  HENT:  Right Ear: External ear normal.  Left Ear: External ear normal.  Mouth/Throat: Oropharynx is clear and moist.  Eyes: Pupils are equal, round, and reactive to light.  Neck: Neck supple. No thyromegaly present.  Cardiovascular: Normal rate and regular rhythm.   Pulmonary/Chest: Effort normal and breath sounds normal. No respiratory distress. He has no wheezes. He has no rales.  Musculoskeletal: He exhibits no edema.  Neurological: He is alert and oriented to person, place,  and time.          Assessment & Plan:   Elevated blood pressure. Repeat blood pressure here today left arm seated 136/88 and right arm seated 142/90. We elected to observe this time. Continue weight loss efforts. Information on DASH diet given. Monitor closely and be in touch if consistently greater than 140/90.

## 2015-06-11 NOTE — Progress Notes (Signed)
Pre visit review using our clinic review tool, if applicable. No additional management support is needed unless otherwise documented below in the visit note. 

## 2015-06-11 NOTE — Patient Instructions (Signed)
DASH Eating Plan DASH stands for "Dietary Approaches to Stop Hypertension." The DASH eating plan is a healthy eating plan that has been shown to reduce high blood pressure (hypertension). Additional health benefits may include reducing the risk of type 2 diabetes mellitus, heart disease, and stroke. The DASH eating plan may also help with weight loss. WHAT DO I NEED TO KNOW ABOUT THE DASH EATING PLAN? For the DASH eating plan, you will follow these general guidelines:  Choose foods with a percent daily value for sodium of less than 5% (as listed on the food label).  Use salt-free seasonings or herbs instead of table salt or sea salt.  Check with your health care provider or pharmacist before using salt substitutes.  Eat lower-sodium products, often labeled as "lower sodium" or "no salt added."  Eat fresh foods.  Eat more vegetables, fruits, and low-fat dairy products.  Choose whole grains. Look for the word "whole" as the first word in the ingredient list.  Choose fish and skinless chicken or turkey more often than red meat. Limit fish, poultry, and meat to 6 oz (170 g) each day.  Limit sweets, desserts, sugars, and sugary drinks.  Choose heart-healthy fats.  Limit cheese to 1 oz (28 g) per day.  Eat more home-cooked food and less restaurant, buffet, and fast food.  Limit fried foods.  Cook foods using methods other than frying.  Limit canned vegetables. If you do use them, rinse them well to decrease the sodium.  When eating at a restaurant, ask that your food be prepared with less salt, or no salt if possible. WHAT FOODS CAN I EAT? Seek help from a dietitian for individual calorie needs. Grains Whole grain or whole wheat bread. Brown rice. Whole grain or whole wheat pasta. Quinoa, bulgur, and whole grain cereals. Low-sodium cereals. Corn or whole wheat flour tortillas. Whole grain cornbread. Whole grain crackers. Low-sodium crackers. Vegetables Fresh or frozen vegetables  (raw, steamed, roasted, or grilled). Low-sodium or reduced-sodium tomato and vegetable juices. Low-sodium or reduced-sodium tomato sauce and paste. Low-sodium or reduced-sodium canned vegetables.  Fruits All fresh, canned (in natural juice), or frozen fruits. Meat and Other Protein Products Ground beef (85% or leaner), grass-fed beef, or beef trimmed of fat. Skinless chicken or turkey. Ground chicken or turkey. Pork trimmed of fat. All fish and seafood. Eggs. Dried beans, peas, or lentils. Unsalted nuts and seeds. Unsalted canned beans. Dairy Low-fat dairy products, such as skim or 1% milk, 2% or reduced-fat cheeses, low-fat ricotta or cottage cheese, or plain low-fat yogurt. Low-sodium or reduced-sodium cheeses. Fats and Oils Tub margarines without trans fats. Light or reduced-fat mayonnaise and salad dressings (reduced sodium). Avocado. Safflower, olive, or canola oils. Natural peanut or almond butter. Other Unsalted popcorn and pretzels. The items listed above may not be a complete list of recommended foods or beverages. Contact your dietitian for more options. WHAT FOODS ARE NOT RECOMMENDED? Grains White bread. White pasta. White rice. Refined cornbread. Bagels and croissants. Crackers that contain trans fat. Vegetables Creamed or fried vegetables. Vegetables in a cheese sauce. Regular canned vegetables. Regular canned tomato sauce and paste. Regular tomato and vegetable juices. Fruits Dried fruits. Canned fruit in light or heavy syrup. Fruit juice. Meat and Other Protein Products Fatty cuts of meat. Ribs, chicken wings, bacon, sausage, bologna, salami, chitterlings, fatback, hot dogs, bratwurst, and packaged luncheon meats. Salted nuts and seeds. Canned beans with salt. Dairy Whole or 2% milk, cream, half-and-half, and cream cheese. Whole-fat or sweetened yogurt. Full-fat   cheeses or blue cheese. Nondairy creamers and whipped toppings. Processed cheese, cheese spreads, or cheese  curds. Condiments Onion and garlic salt, seasoned salt, table salt, and sea salt. Canned and packaged gravies. Worcestershire sauce. Tartar sauce. Barbecue sauce. Teriyaki sauce. Soy sauce, including reduced sodium. Steak sauce. Fish sauce. Oyster sauce. Cocktail sauce. Horseradish. Ketchup and mustard. Meat flavorings and tenderizers. Bouillon cubes. Hot sauce. Tabasco sauce. Marinades. Taco seasonings. Relishes. Fats and Oils Butter, stick margarine, lard, shortening, ghee, and bacon fat. Coconut, palm kernel, or palm oils. Regular salad dressings. Other Pickles and olives. Salted popcorn and pretzels. The items listed above may not be a complete list of foods and beverages to avoid. Contact your dietitian for more information. WHERE CAN I FIND MORE INFORMATION? National Heart, Lung, and Blood Institute: travelstabloid.com   This information is not intended to replace advice given to you by your health care provider. Make sure you discuss any questions you have with your health care provider.   Document Released: 01/27/2011 Document Revised: 02/28/2014 Document Reviewed: 12/12/2012 Elsevier Interactive Patient Education 2016 [place] with weight loss Monitor blood pressure and be in touch if consistently > 140/90

## 2015-06-18 ENCOUNTER — Encounter: Payer: Self-pay | Admitting: Internal Medicine

## 2015-06-18 ENCOUNTER — Ambulatory Visit (INDEPENDENT_AMBULATORY_CARE_PROVIDER_SITE_OTHER): Payer: Medicare Other | Admitting: Internal Medicine

## 2015-06-18 VITALS — BP 128/64 | HR 74 | Ht 78.0 in | Wt 276.8 lb

## 2015-06-18 DIAGNOSIS — J309 Allergic rhinitis, unspecified: Secondary | ICD-10-CM | POA: Diagnosis not present

## 2015-06-18 DIAGNOSIS — G4733 Obstructive sleep apnea (adult) (pediatric): Secondary | ICD-10-CM | POA: Diagnosis not present

## 2015-06-18 NOTE — Progress Notes (Signed)
06/18/2015-55 year old male never smoker Dr Elease Hashimoto; not sleeping well, snores. Denies waking up gasping for air. No Sleep Study. He wakes frequently. Aware of loud snoring reported by his daughter. No ENT surgery. Daytime sleepiness if sits quietly but okay driving. Nasal stuffiness in the morning. History of multiple orthopedic injuries playing hockey. Joint pains do affect his sleep but he doesn't think that's the major issue. Denies lung or heart disease. Weight now is relatively high for him.  Prior to Admission medications   Medication Sig Start Date End Date Taking? Authorizing Provider  Buprenorphine HCl-Naloxone HCl (SUBOXONE) 8-2 MG FILM Place 1 Film under the tongue 3 (three) times daily. Prescribed by Dr. Christie Nottingham 06/27/12  Yes Historical Provider, MD  Garlic [id number] MG TABS Take by mouth.   Yes Historical Provider, MD  ibuprofen (ADVIL,MOTRIN) 200 MG tablet Take 600 mg by mouth daily as needed for pain.   Yes Historical Provider, MD  Multiple Vitamins-Minerals (MENS MULTI VITAMIN & MINERAL PO) Take 1 tablet by mouth daily.    Yes Historical Provider, MD  niacin 250 MG tablet Take 250 mg by mouth daily.   Yes Historical Provider, MD  Omega-3 Fatty Acids (FISH OIL CONCENTRATE PO) Take by mouth.   Yes Historical Provider, MD  PARoxetine (PAXIL) 40 MG tablet Take 40 mg by mouth daily.    Yes Historical Provider, MD  rosuvastatin (CRESTOR) 20 MG tablet Take 1 tablet (20 mg total) by mouth daily. 11/06/14  Yes Eulas Post, MD   Past Medical History  Diagnosis Date  . Alcohol abuse   . Chicken pox   . Anxiety     takes Paxil daily  . Family history of anesthesia complication     brother got sick after anesthesia  . Hyperlipidemia     takes Fish Oil daily  . Numbness     both leg and feet  . Peripheral neuropathy ([place])   . Joint pain   . Joint swelling   . Osteoarthritis     "right knee; both hips" (12/03/2012)   Past Surgical History  Procedure Laterality Date  . Knee  surgery Right 1967, 1973, 1980    "benign tumor removal" (12/03/2012)  . Shoulder arthroscopy with open rotator cuff repair Right 2006  . Total knee arthroplasty  11/16/2011    Procedure: TOTAL KNEE ARTHROPLASTY;  Surgeon: Kerin Salen, MD;  Location: [facility];  Service: Orthopedics;  Laterality: Right;  right total knee arthroplasty  . Shoulder arthroscopy with open rotator cuff repair Left 06/2012  . Incision and drainage of wound Right 2006    "w/jet lavage; had to do this twice after the OR" (12/03/2012)  . Total knee revision with scar debridement/patella revision with poly exchange Right 12/03/2012    "not sure what they did exactly" (10/'13/2014)  . Excisional total knee arthroplasty Right 12/03/2012    Procedure: EXCISIONAL TOTAL KNEE ARTHROPLASTY (POLY SWAP), SCAR TISSUE REMOVAL, QUADRICEPSPLASTY;  Surgeon: Vickey Huger, MD;  Location: [facility];  Service: Orthopedics;  Laterality: Right;   Family History  Problem Relation Age of Onset  . Cancer Mother 42    breast  . Hyperlipidemia Mother   . Heart disease Mother   . Cancer Father     prostate  . Hyperlipidemia Brother   . Cancer Maternal Aunt     breast  . Alcohol abuse Paternal Aunt   . Heart disease Maternal Grandfather   . Alcohol abuse Paternal Grandfather   . Heart disease Paternal Grandfather  Social History   Social History  . Marital Status: Divorced    Spouse Name: N/A  . Number of Children: N/A  . Years of Education: N/A   Occupational History  . Not on file.   Social History Main Topics  . Smoking status: Never Smoker   . Smokeless tobacco: Former Systems developer    Types: Snuff, Chew     Comment: 12/03/2012 "stopped smokeless tobacco in the early 1990's"  . Alcohol Use: Yes     Comment: 12/03/2012 "quit all alcohol in 1998; family hx of problems w/it"  . Drug Use: No  . Sexual Activity: Not Currently   Other Topics Concern  . Not on file   Social History Narrative   ROS-see HPI   Negative unless  "+" Constitutional:    weight loss, night sweats, fevers, chills,+ fatigue, lassitude. HEENT:    headaches, difficulty swallowing, tooth/dental problems, sore throat,       sneezing, itching, ear ache, + nasal congestion, post nasal drip, snoring CV:    chest pain, orthopnea, PND, swelling in lower extremities, anasarca,                                                     dizziness, palpitations Resp:   shortness of breath with exertion or at rest.                productive cough,   non-productive cough, coughing up of blood.              change in color of mucus.  wheezing.   Skin:    rash or lesions. GI:  No-   heartburn, indigestion, abdominal pain, nausea, vomiting, diarrhea,                 change in bowel habits, loss of appetite GU: dysuria, change in color of urine, no urgency or frequency.   flank pain. MS:   joint pain, stiffness, decreased range of motion, back pain. Neuro-     nothing unusual Psych:  change in mood or affect.  depression or anxiety.   memory loss.  OBJ- Physical Exam General- Alert, Oriented, Affect-appropriate, Distress- none acute, big man not obese Skin- rash-none, lesions- none, excoriation- none Lymphadenopathy- none Head- atraumatic            Eyes- Gross vision intact, PERRLA, conjunctivae and secretions clear            Ears- Hearing, canals-normal            Nose- Clear, no-Septal dev, mucus, polyps, erosion, perforation             Throat- Mallampati II-III , mucosa clear , drainage- none, tonsils- atrophic Neck- flexible , trachea midline, no stridor , thyroid nl, carotid no bruit Chest - symmetrical excursion , unlabored           Heart/CV- RRR , no murmur , no gallop  , no rub, nl s1 s2                           - JVD- none , edema- none, stasis changes- none, varices- none           Lung- clear to P&A, wheeze- none, cough- none , dullness-none, rub- none  Chest wall-  Abd-  Br/ Gen/ Rectal- Not done, not indicated Extrem- cyanosis-  none, clubbing, none, atrophy- none, strength- nl, + surgical scars on knees Neuro- grossly intact to observation

## 2015-06-18 NOTE — Patient Instructions (Signed)
Order- schedule unattended home sleep test      Dx OSA   Please call as needd

## 2015-06-21 DIAGNOSIS — J309 Allergic rhinitis, unspecified: Secondary | ICD-10-CM

## 2015-06-21 DIAGNOSIS — G4733 Obstructive sleep apnea (adult) (pediatric): Secondary | ICD-10-CM

## 2015-06-21 HISTORY — DX: Allergic rhinitis, unspecified: J30.9

## 2015-06-21 HISTORY — DX: Obstructive sleep apnea (adult) (pediatric): G47.33

## 2015-06-21 NOTE — Assessment & Plan Note (Addendum)
Moderately high probability diagnosis. We discussed the basic physiology, medical concerns, testing process and basic sleep hygiene with emphasis on his responsibility to drive safely Plan-schedule sleep study

## 2015-06-21 NOTE — Assessment & Plan Note (Addendum)
Waking in the morning with nasal congestion. This may be vasomotor rather than allergic. It doesn't seem to be seasonal. It may aggravate snoring Plan-try Flonase at bedtime

## 2015-07-23 ENCOUNTER — Ambulatory Visit: Payer: Medicare Other | Admitting: Internal Medicine

## 2015-08-20 DIAGNOSIS — G4733 Obstructive sleep apnea (adult) (pediatric): Secondary | ICD-10-CM | POA: Diagnosis not present

## 2015-08-28 ENCOUNTER — Other Ambulatory Visit: Payer: Self-pay | Admitting: *Deleted

## 2015-08-28 DIAGNOSIS — G4733 Obstructive sleep apnea (adult) (pediatric): Secondary | ICD-10-CM | POA: Diagnosis not present

## 2015-09-02 ENCOUNTER — Telehealth: Payer: Self-pay | Admitting: Internal Medicine

## 2015-09-02 NOTE — Telephone Encounter (Signed)
lmomtcb x1 

## 2015-09-02 NOTE — Telephone Encounter (Signed)
Returned call, [phone].

## 2015-09-03 ENCOUNTER — Ambulatory Visit: Payer: Medicare Other | Admitting: Internal Medicine

## 2015-09-03 NOTE — Telephone Encounter (Signed)
Pt returning call from triage from yesterday.

## 2015-09-03 NOTE — Telephone Encounter (Signed)
Called to offer this appt to the pt and he stated that he cannot get off of work for that appt date either.  He needs either a Tuesday appt or any day after 4 pm.  Please advise katie.  thanks

## 2015-09-03 NOTE — Telephone Encounter (Signed)
09/10/15 at 11:15am (30 minute) can be used. Thanks.

## 2015-09-03 NOTE — Telephone Encounter (Signed)
lmtcb x2 

## 2015-09-03 NOTE — Telephone Encounter (Signed)
lmomtcb x 1 for pt to see if this date for the appt will work for him.

## 2015-09-03 NOTE — Telephone Encounter (Signed)
Pt had to cancel appt to discuss HST. Needing another appt. Please advise Joellen [place] thanks

## 2015-09-09 NOTE — Telephone Encounter (Signed)
lmtcb x1 for pt. 

## 2015-09-09 NOTE — Telephone Encounter (Signed)
Made the pt an appt  On 07/20 pt aware

## 2015-09-09 NOTE — Telephone Encounter (Signed)
Pt can be seen Thursday 09/10/15 at 4:00pm (30 minute slot). Otherwise it will be about a month or so longer to work patient in on requested days/times. Thanks.

## 2015-09-10 ENCOUNTER — Ambulatory Visit: Payer: Medicare Other | Admitting: Internal Medicine

## 2015-09-15 ENCOUNTER — Telehealth: Payer: Self-pay | Admitting: Internal Medicine

## 2015-09-15 NOTE — Telephone Encounter (Signed)
LMTCB-pt will need to speak with Charne Mcbrien. Pt had HST that still needs to be reviewed with patient. Pt has been unable to keep OV's made for him.

## 2015-09-15 NOTE — Telephone Encounter (Signed)
Called spoke with pt. Pt request follow up before 03/10/16. I explained to him that I would discuss this with Joellen Jersey and return his call. He voiced understanding and had no further questions.   Joellen Jersey can we use a 15 follow up slot

## 2015-09-18 ENCOUNTER — Ambulatory Visit (INDEPENDENT_AMBULATORY_CARE_PROVIDER_SITE_OTHER): Payer: Medicare Other | Admitting: Family Medicine

## 2015-09-18 ENCOUNTER — Encounter: Payer: Self-pay | Admitting: Family Medicine

## 2015-09-18 VITALS — BP 130/82 | HR 65 | Temp 98.1°F | Ht 78.0 in | Wt 280.0 lb

## 2015-09-18 DIAGNOSIS — G5791 Unspecified mononeuropathy of right lower limb: Secondary | ICD-10-CM | POA: Diagnosis not present

## 2015-09-18 DIAGNOSIS — G5792 Unspecified mononeuropathy of left lower limb: Secondary | ICD-10-CM | POA: Diagnosis not present

## 2015-09-18 DIAGNOSIS — G5793 Unspecified mononeuropathy of bilateral lower limbs: Secondary | ICD-10-CM

## 2015-09-18 LAB — VITAMIN B12: Vitamin B-12: 750 pg/mL (ref 211–911)

## 2015-09-18 LAB — TSH: TSH: 0.95 u[IU]/mL (ref 0.35–4.50)

## 2015-09-18 LAB — HEMOGLOBIN A1C: Hgb A1c MFr Bld: 5.5 % (ref 4.6–6.5)

## 2015-09-18 NOTE — Telephone Encounter (Signed)
LMTCB

## 2015-09-18 NOTE — Patient Instructions (Signed)

## 2015-09-18 NOTE — Progress Notes (Signed)
Subjective:     Patient ID: Jimmy Black, male   DOB: 05/15/1960, 55 y.o.   MRN: [id number]  HPI Patient seen with chief complaint of bilateral leg numbness and recently progressive burning dysesthesias involving the feet and sometimes legs. He states he had neuropathy type symptoms around 15 years. Does recall seeing someone years ago thinks he had some type of nerve conductions but was not sure of the extent of other workup.  Denies any low back pain. His numbness is from the knee down bilaterally. Burning mostly in the feet. No history of diabetes. Previous thyroid function testing normal. He did abuse alcohol considerably in past and quit about 18 years ago. This is about the time his symptoms started. Denies any urine or stool incontinence. He does not have any significant lower extremity weakness.  Past Medical History:  Diagnosis Date  . Alcohol abuse   . Anxiety    takes Paxil daily  . Chicken pox   . Family history of anesthesia complication    brother got sick after anesthesia  . Hyperlipidemia    takes Fish Oil daily  . Joint pain   . Joint swelling   . Numbness    both leg and feet  . Osteoarthritis    "right knee; both hips" (12/03/2012)  . Peripheral neuropathy [facility])    Past Surgical History:  Procedure Laterality Date  . EXCISIONAL TOTAL KNEE ARTHROPLASTY Right 12/03/2012   Procedure: EXCISIONAL TOTAL KNEE ARTHROPLASTY (POLY SWAP), SCAR TISSUE REMOVAL, QUADRICEPSPLASTY;  Surgeon: Vickey Huger, MD;  Location: [facility];  Service: Orthopedics;  Laterality: Right;  . INCISION AND DRAINAGE OF WOUND Right 2006   "w/jet lavage; had to do this twice after the OR" (12/03/2012)  . [place]   "benign tumor removal" (12/03/2012)  . SHOULDER ARTHROSCOPY WITH OPEN ROTATOR CUFF REPAIR Right 2006  . SHOULDER ARTHROSCOPY WITH OPEN ROTATOR CUFF REPAIR Left 06/2012  . TOTAL KNEE ARTHROPLASTY  11/16/2011   Procedure: TOTAL KNEE ARTHROPLASTY;  Surgeon:  Kerin Salen, MD;  Location: [facility];  Service: Orthopedics;  Laterality: Right;  right total knee arthroplasty  . TOTAL KNEE REVISION WITH SCAR DEBRIDEMENT/PATELLA REVISION WITH POLY EXCHANGE Right 12/03/2012   "not sure what they did exactly" (10/'13/2014)    reports that he has never smoked. He has quit using smokeless tobacco. His smokeless tobacco use included Snuff and Chew. He reports that he drinks alcohol. He reports that he does not use drugs. family history includes Alcohol abuse in his paternal aunt and paternal grandfather; Cancer in his father and maternal aunt; Cancer (age of onset: 70) in his mother; Heart disease in his maternal grandfather, mother, and paternal grandfather; Hyperlipidemia in his brother and mother. No Known Allergies   Review of Systems  Constitutional: Negative for appetite change, fatigue, fever and unexpected weight change.  Respiratory: Negative for shortness of breath.   Cardiovascular: Negative for chest pain.  Musculoskeletal: Negative for back pain.  Neurological: Positive for numbness. Negative for weakness.       Objective:   Physical Exam  Constitutional: He is oriented to person, place, and time. He appears well-developed and well-nourished.  Cardiovascular: Normal rate and regular rhythm.   Pulmonary/Chest: Effort normal and breath sounds normal. No respiratory distress. He has no wheezes. He has no rales.  Musculoskeletal: He exhibits no edema.  Neurological: He is alert and oriented to person, place, and time. Coordination normal.  Trace ankle reflex bilaterally. Trace knee bilaterally. Full-strength  lower extremities. 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 Worsening in recent months. Etiology unclear. Possibly related to previous alcohol abuse    Plan:     -Check labs with TSH, B12, serum  protein electrophoresis, hemoglobin A1c -Consider trial of gabapentin for progressive pains but will check labs first  Eulas Post MD [facility] at [facility]

## 2015-09-21 ENCOUNTER — Encounter: Payer: Self-pay | Admitting: Family Medicine

## 2015-09-22 ENCOUNTER — Encounter: Payer: Self-pay | Admitting: Family Medicine

## 2015-09-22 LAB — PROTEIN ELECTROPHORESIS, SERUM
ALBUMIN ELP: 4.4 g/dL (ref 3.8–4.8)
ALPHA-1-GLOBULIN: 0.2 g/dL (ref 0.2–0.3)
ALPHA-2-GLOBULIN: 0.5 g/dL (ref 0.5–0.9)
BETA 2: 0.3 g/dL (ref 0.2–0.5)
BETA GLOBULIN: 0.4 g/dL (ref 0.4–0.6)
GAMMA GLOBULIN: 0.9 g/dL (ref 0.8–1.7)
Total Protein, Serum Electrophoresis: 6.7 g/dL (ref 6.1–8.1)

## 2015-09-22 NOTE — Telephone Encounter (Signed)
[phone], pt calling back to speak to nurse

## 2015-09-23 MED ORDER — GABAPENTIN 300 MG PO CAPS: ORAL_CAPSULE | ORAL | 1 refills | Status: DC

## 2015-09-23 NOTE — Telephone Encounter (Signed)
Spoke with patient; he will be seen 10-12-15 at 4:15pm. Nothing more needed at this time.

## 2015-09-23 NOTE — Addendum Note (Signed)
Addended by: Elio Forget on: 09/23/2015 12:59 PM   Modules accepted: Orders

## 2015-10-12 ENCOUNTER — Ambulatory Visit: Payer: Medicare Other | Admitting: Internal Medicine

## 2015-10-15 ENCOUNTER — Telehealth: Payer: Self-pay

## 2015-10-15 NOTE — Telephone Encounter (Signed)
Patient is on the list for [employer] 2017 and may be a good candidate for an AWV in 2017. Please let me know if/when appt is scheduled.   

## 2015-12-01 ENCOUNTER — Encounter: Payer: Self-pay | Admitting: Family Medicine

## 2015-12-01 ENCOUNTER — Other Ambulatory Visit: Payer: Medicare Other

## 2015-12-09 ENCOUNTER — Telehealth: Payer: Self-pay | Admitting: Family Medicine

## 2015-12-09 ENCOUNTER — Other Ambulatory Visit: Payer: Self-pay

## 2015-12-09 ENCOUNTER — Encounter: Payer: Medicare Other | Admitting: Family Medicine

## 2015-12-09 DIAGNOSIS — Z1211 Encounter for screening for malignant neoplasm of colon: Secondary | ICD-10-CM

## 2015-12-09 DIAGNOSIS — Z1159 Encounter for screening for other viral diseases: Secondary | ICD-10-CM

## 2015-12-09 NOTE — Telephone Encounter (Signed)
Pt would like a referral for his first colonoscopy.  Also, a hep c screening added when he does his cpx labs. Thanks!

## 2015-12-09 NOTE — Telephone Encounter (Signed)
Pt appt's are UTD. Okay for orders?

## 2015-12-09 NOTE — Telephone Encounter (Signed)
Sorry you have had trouble getting through. I have cancelled your pyhysical.  Do you want me to call you to reschedule? Or you tell me when you want to come in and I will try to work something out. Thank you for your patience! Juliann Pulse  Also, you can do the hep c screening with your physical labs.

## 2015-12-09 NOTE — Telephone Encounter (Signed)
Yes-OK to add Hep C antibody and also set up colonoscopy referral.

## 2015-12-09 NOTE — Telephone Encounter (Signed)
Orders entered for both tests.

## 2015-12-21 ENCOUNTER — Other Ambulatory Visit: Payer: Self-pay

## 2015-12-21 NOTE — Telephone Encounter (Signed)
Order entered for patient. 

## 2015-12-21 NOTE — Telephone Encounter (Signed)
I do not see the referral for colonoscopy. Was it done?  Just checking. Thank you!!  :)

## 2016-01-21 ENCOUNTER — Telehealth: Payer: Self-pay

## 2016-01-21 ENCOUNTER — Encounter: Payer: Self-pay | Admitting: Family Medicine

## 2016-01-21 NOTE — Telephone Encounter (Signed)
Call to Jimmy Black to schedule AWV Need to see if he has been on Medicare > one year?  Will call [place] office to schedule

## 2016-07-27 LAB — FECAL OCCULT BLOOD, GUAIAC: FECAL OCCULT BLD: NEGATIVE

## 2016-08-10 ENCOUNTER — Encounter: Payer: Medicare Other | Admitting: Family Medicine

## 2016-08-10 ENCOUNTER — Encounter: Payer: Self-pay | Admitting: Family Medicine

## 2016-08-19 ENCOUNTER — Encounter: Payer: Self-pay | Admitting: Family Medicine

## 2016-11-10 ENCOUNTER — Encounter: Payer: Self-pay | Admitting: Family Medicine

## 2016-12-20 ENCOUNTER — Inpatient Hospital Stay (HOSPITAL_COMMUNITY)
Admission: EM | Admit: 2016-12-20 | Discharge: 2016-12-25 | DRG: 964 | Disposition: A | Discharge status: Home / Self Care | Payer: Medicare Other | Attending: Surgery | Admitting: Surgery

## 2016-12-20 ENCOUNTER — Encounter (HOSPITAL_COMMUNITY): Payer: Self-pay

## 2016-12-20 DIAGNOSIS — J9811 Atelectasis: Secondary | ICD-10-CM | POA: Diagnosis present

## 2016-12-20 DIAGNOSIS — S36031A Moderate laceration of spleen, initial encounter: Principal | ICD-10-CM | POA: Diagnosis present

## 2016-12-20 DIAGNOSIS — S36039A Unspecified laceration of spleen, initial encounter: Secondary | ICD-10-CM | POA: Diagnosis present

## 2016-12-20 DIAGNOSIS — E785 Hyperlipidemia, unspecified: Secondary | ICD-10-CM | POA: Diagnosis present

## 2016-12-20 DIAGNOSIS — D62 Acute posthemorrhagic anemia: Secondary | ICD-10-CM | POA: Diagnosis present

## 2016-12-20 DIAGNOSIS — S2242XA Multiple fractures of ribs, left side, initial encounter for closed fracture: Secondary | ICD-10-CM

## 2016-12-20 DIAGNOSIS — S27321A Contusion of lung, unilateral, initial encounter: Secondary | ICD-10-CM

## 2016-12-20 DIAGNOSIS — J942 Hemothorax: Secondary | ICD-10-CM

## 2016-12-20 DIAGNOSIS — Z96651 Presence of right artificial knee joint: Secondary | ICD-10-CM | POA: Diagnosis present

## 2016-12-20 DIAGNOSIS — S27802A Contusion of diaphragm, initial encounter: Secondary | ICD-10-CM | POA: Diagnosis present

## 2016-12-20 DIAGNOSIS — Z87891 Personal history of nicotine dependence: Secondary | ICD-10-CM

## 2016-12-20 DIAGNOSIS — S271XXA Traumatic hemothorax, initial encounter: Secondary | ICD-10-CM | POA: Diagnosis present

## 2016-12-20 DIAGNOSIS — W109XXA Fall (on) (from) unspecified stairs and steps, initial encounter: Secondary | ICD-10-CM | POA: Diagnosis present

## 2016-12-20 DIAGNOSIS — Z23 Encounter for immunization: Secondary | ICD-10-CM

## 2016-12-20 DIAGNOSIS — G629 Polyneuropathy, unspecified: Secondary | ICD-10-CM | POA: Diagnosis present

## 2016-12-20 DIAGNOSIS — G8929 Other chronic pain: Secondary | ICD-10-CM | POA: Diagnosis present

## 2016-12-20 DIAGNOSIS — F419 Anxiety disorder, unspecified: Secondary | ICD-10-CM | POA: Diagnosis present

## 2016-12-20 MED ORDER — LORAZEPAM 2 MG/ML IJ SOLN
1.0000 mg | Freq: Once | INTRAMUSCULAR | Status: AC
  Administered 2016-12-21: 1 mg via INTRAVENOUS
  Filled 2016-12-20: qty 1

## 2016-12-20 MED ORDER — TETANUS-DIPHTH-ACELL PERTUSSIS 5-2.5-18.5 LF-MCG/0.5 IM SUSP
0.5000 mL | Freq: Once | INTRAMUSCULAR | Status: AC
  Administered 2016-12-21: 0.5 mL via INTRAMUSCULAR
  Filled 2016-12-20: qty 0.5

## 2016-12-20 NOTE — ED Triage Notes (Signed)
Pt arrives to ED from home with complaints of falling off a 3 foot ledge walking [place] of a skating rink since 1930 this evening. Pt has left sided rib cage pain and pain with deep inhalation. Pt denies LOC or hitting head. Pt states he was in [place] and drove home and called 911 when the pain did not get better. Pt placed in position of comfort with bed locked and lowered, call bell in reach.

## 2016-12-21 ENCOUNTER — Inpatient Hospital Stay (HOSPITAL_COMMUNITY): Payer: Medicare Other

## 2016-12-21 ENCOUNTER — Emergency Department (HOSPITAL_COMMUNITY): Payer: Medicare Other

## 2016-12-21 ENCOUNTER — Encounter (HOSPITAL_COMMUNITY): Payer: Self-pay

## 2016-12-21 DIAGNOSIS — Z96651 Presence of right artificial knee joint: Secondary | ICD-10-CM | POA: Diagnosis present

## 2016-12-21 DIAGNOSIS — S36039A Unspecified laceration of spleen, initial encounter: Secondary | ICD-10-CM

## 2016-12-21 DIAGNOSIS — S2242XA Multiple fractures of ribs, left side, initial encounter for closed fracture: Secondary | ICD-10-CM | POA: Diagnosis present

## 2016-12-21 DIAGNOSIS — S36031A Moderate laceration of spleen, initial encounter: Secondary | ICD-10-CM | POA: Diagnosis present

## 2016-12-21 DIAGNOSIS — D62 Acute posthemorrhagic anemia: Secondary | ICD-10-CM | POA: Diagnosis present

## 2016-12-21 DIAGNOSIS — Z23 Encounter for immunization: Secondary | ICD-10-CM | POA: Diagnosis present

## 2016-12-21 DIAGNOSIS — E785 Hyperlipidemia, unspecified: Secondary | ICD-10-CM | POA: Diagnosis present

## 2016-12-21 DIAGNOSIS — G8929 Other chronic pain: Secondary | ICD-10-CM | POA: Diagnosis present

## 2016-12-21 DIAGNOSIS — Z87891 Personal history of nicotine dependence: Secondary | ICD-10-CM | POA: Diagnosis not present

## 2016-12-21 DIAGNOSIS — S271XXA Traumatic hemothorax, initial encounter: Secondary | ICD-10-CM | POA: Diagnosis present

## 2016-12-21 DIAGNOSIS — J942 Hemothorax: Secondary | ICD-10-CM | POA: Diagnosis present

## 2016-12-21 DIAGNOSIS — G629 Polyneuropathy, unspecified: Secondary | ICD-10-CM | POA: Diagnosis present

## 2016-12-21 DIAGNOSIS — J9811 Atelectasis: Secondary | ICD-10-CM | POA: Diagnosis present

## 2016-12-21 DIAGNOSIS — F419 Anxiety disorder, unspecified: Secondary | ICD-10-CM | POA: Diagnosis present

## 2016-12-21 DIAGNOSIS — S27802A Contusion of diaphragm, initial encounter: Secondary | ICD-10-CM | POA: Diagnosis present

## 2016-12-21 DIAGNOSIS — W109XXA Fall (on) (from) unspecified stairs and steps, initial encounter: Secondary | ICD-10-CM | POA: Diagnosis present

## 2016-12-21 HISTORY — DX: Unspecified laceration of spleen, initial encounter: S36.039A

## 2016-12-21 LAB — CBC WITH DIFFERENTIAL/PLATELET
BASOS ABS: 0.1 10*3/uL (ref 0.0–0.1)
BASOS PCT: 0 %
Basophils Absolute: 0 10*3/uL (ref 0.0–0.1)
Basophils Relative: 0 %
EOS ABS: 0 10*3/uL (ref 0.0–0.7)
EOS ABS: 0.1 10*3/uL (ref 0.0–0.7)
EOS PCT: 0 %
Eosinophils Relative: 0 %
HCT: 33.2 % — ABNORMAL LOW (ref 39.0–52.0)
HEMATOCRIT: 38.3 % — AB (ref 39.0–52.0)
HEMOGLOBIN: 13.3 g/dL (ref 13.0–17.0)
Hemoglobin: 11.1 g/dL — ABNORMAL LOW (ref 13.0–17.0)
LYMPHS ABS: 1.6 10*3/uL (ref 0.7–4.0)
Lymphocytes Relative: 11 %
Lymphocytes Relative: 8 %
Lymphs Abs: 1.7 10*3/uL (ref 0.7–4.0)
MCH: 28.8 pg (ref 26.0–34.0)
MCH: 30.2 pg (ref 26.0–34.0)
MCHC: 33.4 g/dL (ref 30.0–36.0)
MCHC: 34.7 g/dL (ref 30.0–36.0)
MCV: 86.2 fL (ref 78.0–100.0)
MCV: 86.8 fL (ref 78.0–100.0)
MONO ABS: 1.3 10*3/uL — AB (ref 0.1–1.0)
MONO ABS: 1.4 10*3/uL — AB (ref 0.1–1.0)
MONOS PCT: 9 %
Monocytes Relative: 6 %
NEUTROS ABS: 18.5 10*3/uL — AB (ref 1.7–7.7)
NEUTROS PCT: 86 %
Neutro Abs: 11.5 10*3/uL — ABNORMAL HIGH (ref 1.7–7.7)
Neutrophils Relative %: 80 %
PLATELETS: 211 10*3/uL (ref 150–400)
Platelets: 252 10*3/uL (ref 150–400)
RBC: 3.85 MIL/uL — ABNORMAL LOW (ref 4.22–5.81)
RBC: 4.41 MIL/uL (ref 4.22–5.81)
RDW: 13.3 % (ref 11.5–15.5)
RDW: 13.7 % (ref 11.5–15.5)
WBC: 14.5 10*3/uL — AB (ref 4.0–10.5)
WBC: 21.6 10*3/uL — ABNORMAL HIGH (ref 4.0–10.5)

## 2016-12-21 LAB — CBC
HEMATOCRIT: 35.3 % — AB (ref 39.0–52.0)
HEMOGLOBIN: 11.7 g/dL — AB (ref 13.0–17.0)
MCH: 28.7 pg (ref 26.0–34.0)
MCHC: 33.1 g/dL (ref 30.0–36.0)
MCV: 86.7 fL (ref 78.0–100.0)
Platelets: 230 10*3/uL (ref 150–400)
RBC: 4.07 MIL/uL — ABNORMAL LOW (ref 4.22–5.81)
RDW: 13.3 % (ref 11.5–15.5)
WBC: 15.7 10*3/uL — ABNORMAL HIGH (ref 4.0–10.5)

## 2016-12-21 LAB — URINALYSIS, ROUTINE W REFLEX MICROSCOPIC
Bacteria, UA: NONE SEEN
Bilirubin Urine: NEGATIVE
GLUCOSE, UA: NEGATIVE mg/dL
KETONES UR: NEGATIVE mg/dL
LEUKOCYTES UA: NEGATIVE
NITRITE: NEGATIVE
PROTEIN: NEGATIVE mg/dL
Specific Gravity, Urine: 1.038 — ABNORMAL HIGH (ref 1.005–1.030)
pH: 5 (ref 5.0–8.0)

## 2016-12-21 LAB — BASIC METABOLIC PANEL
ANION GAP: 9 (ref 5–15)
Anion gap: 8 (ref 5–15)
BUN: 28 mg/dL — ABNORMAL HIGH (ref 6–20)
BUN: 27 mg/dL — AB (ref 6–20)
CALCIUM: 9.3 mg/dL (ref 8.9–10.3)
CHLORIDE: 105 mmol/L (ref 101–111)
CO2: 24 mmol/L (ref 22–32)
CO2: 22 mmol/L (ref 22–32)
CREATININE: 1.09 mg/dL (ref 0.61–1.24)
Calcium: 8.6 mg/dL — ABNORMAL LOW (ref 8.9–10.3)
Chloride: 103 mmol/L (ref 101–111)
Creatinine, Ser: 0.88 mg/dL (ref 0.61–1.24)
GFR calc Af Amer: >60 mL/min (ref >60)
GFR calc non Af Amer: >60 mL/min (ref >60)
GLUCOSE: 123 mg/dL — AB (ref 65–99)
Glucose, Bld: 132 mg/dL — ABNORMAL HIGH (ref 65–99)
Potassium: 5 mmol/L (ref 3.5–5.1)
Potassium: 4.9 mmol/L (ref 3.5–5.1)
Sodium: 136 mmol/L (ref 135–145)
Sodium: 135 mmol/L (ref 135–145)

## 2016-12-21 LAB — TYPE AND SCREEN
ABO/RH(D): O NEG
Antibody Screen: NEGATIVE

## 2016-12-21 LAB — RAPID URINE DRUG SCREEN, HOSP PERFORMED
Amphetamines: NOT DETECTED
BENZODIAZEPINES: POSITIVE — AB
Barbiturates: NOT DETECTED
Cocaine: NOT DETECTED
Opiates: NOT DETECTED
Tetrahydrocannabinol: NOT DETECTED

## 2016-12-21 LAB — PROTIME-INR
INR: 1.03
PROTHROMBIN TIME: 13.4 s (ref 11.4–15.2)

## 2016-12-21 LAB — HIV ANTIBODY (ROUTINE TESTING W REFLEX): HIV SCREEN 4TH GENERATION: NONREACTIVE

## 2016-12-21 LAB — ETHANOL

## 2016-12-21 MED ORDER — ACETAMINOPHEN 500 MG PO TABS
1000.0000 mg | ORAL_TABLET | Freq: Three times a day (TID) | ORAL | Status: DC
Start: 2016-12-21 — End: 2016-12-25
  Administered 2016-12-21 – 2016-12-25 (×11): 1000 mg via ORAL
  Filled 2016-12-21 (×11): qty 2

## 2016-12-21 MED ORDER — KCL IN DEXTROSE-NACL 20-5-0.45 MEQ/L-%-% IV SOLN
INTRAVENOUS | Status: DC
  Administered 2016-12-21: 1000 mL via INTRAVENOUS
  Administered 2016-12-22: 18:00:00 via INTRAVENOUS
  Filled 2016-12-21 (×4): qty 1000

## 2016-12-21 MED ORDER — METHOCARBAMOL 750 MG PO TABS
750.0000 mg | ORAL_TABLET | Freq: Three times a day (TID) | ORAL | Status: DC
  Administered 2016-12-21 – 2016-12-25 (×12): 750 mg via ORAL
  Filled 2016-12-21 (×3): qty 1
  Filled 2016-12-21 (×2): qty 2
  Filled 2016-12-21: qty 1
  Filled 2016-12-21 (×3): qty 2
  Filled 2016-12-21 (×4): qty 1

## 2016-12-21 MED ORDER — OXYCODONE HCL 5 MG PO TABS
5.0000 mg | ORAL_TABLET | ORAL | Status: DC | PRN
  Administered 2016-12-21 – 2016-12-22 (×3): 5 mg via ORAL
  Filled 2016-12-21 (×3): qty 1

## 2016-12-21 MED ORDER — KETOROLAC TROMETHAMINE 15 MG/ML IJ SOLN: 15.0000 mg | Freq: Four times a day (QID) | INTRAMUSCULAR | Status: DC

## 2016-12-21 MED ORDER — DOCUSATE SODIUM 100 MG PO CAPS
100.0000 mg | ORAL_CAPSULE | Freq: Two times a day (BID) | ORAL | Status: DC
  Administered 2016-12-21 – 2016-12-25 (×6): 100 mg via ORAL
  Filled 2016-12-21 (×8): qty 1

## 2016-12-21 MED ORDER — SODIUM CHLORIDE 0.9 % IV BOLUS (SEPSIS)
1000.0000 mL | Freq: Once | INTRAVENOUS | Status: AC
  Administered 2016-12-21: 1000 mL via INTRAVENOUS

## 2016-12-21 MED ORDER — INFLUENZA VAC SPLIT QUAD 0.5 ML IM SUSY: 0.5000 mL | PREFILLED_SYRINGE | INTRAMUSCULAR | Status: DC

## 2016-12-21 MED ORDER — IOPAMIDOL (ISOVUE-300) INJECTION 61%
INTRAVENOUS | Status: AC
  Administered 2016-12-21: 100 mL
  Filled 2016-12-21: qty 100

## 2016-12-21 MED ORDER — LORAZEPAM 2 MG/ML IJ SOLN
0.5000 mg | Freq: Once | INTRAMUSCULAR | Status: AC
  Administered 2016-12-21: 0.5 mg via INTRAVENOUS
  Filled 2016-12-21: qty 1

## 2016-12-21 MED ORDER — ONDANSETRON HCL 4 MG/2ML IJ SOLN
4.0000 mg | Freq: Four times a day (QID) | INTRAMUSCULAR | Status: DC | PRN
  Filled 2016-12-21: qty 2

## 2016-12-21 MED ORDER — ONDANSETRON 4 MG PO TBDP: 4.0000 mg | ORAL_TABLET | Freq: Four times a day (QID) | ORAL | Status: DC | PRN

## 2016-12-21 MED ORDER — IOPAMIDOL (ISOVUE-370) INJECTION 76%
INTRAVENOUS | Status: AC
  Administered 2016-12-21: 100 mL via INTRAVENOUS
  Filled 2016-12-21: qty 100

## 2016-12-21 MED ORDER — IOPAMIDOL (ISOVUE-300) INJECTION 61%
INTRAVENOUS | Status: AC
  Filled 2016-12-21: qty 100

## 2016-12-21 MED ORDER — ACETAMINOPHEN 325 MG PO TABS: 650.0000 mg | ORAL_TABLET | ORAL | Status: DC | PRN

## 2016-12-21 MED ORDER — KETOROLAC TROMETHAMINE 30 MG/ML IJ SOLN: 30.0000 mg | Freq: Once | INTRAMUSCULAR | Status: DC

## 2016-12-21 MED ORDER — MORPHINE SULFATE (PF) 4 MG/ML IV SOLN
2.0000 mg | INTRAVENOUS | Status: DC | PRN
  Administered 2016-12-21 – 2016-12-22 (×6): 4 mg via INTRAVENOUS
  Filled 2016-12-21 (×6): qty 1

## 2016-12-21 MED ORDER — HYDROMORPHONE HCL 1 MG/ML IJ SOLN: 0.5000 mg | INTRAMUSCULAR | Status: DC | PRN

## 2016-12-21 MED ORDER — HYDRALAZINE HCL 20 MG/ML IJ SOLN: 10.0000 mg | INTRAMUSCULAR | Status: DC | PRN

## 2016-12-21 NOTE — ED Notes (Signed)
Patient sleeping at present.  

## 2016-12-21 NOTE — ED Notes (Signed)
Patient is sleeping at present

## 2016-12-21 NOTE — ED Notes (Signed)
States he is resting a little better in the hospital bed.

## 2016-12-21 NOTE — ED Notes (Signed)
Patient transported to CT 

## 2016-12-21 NOTE — ED Notes (Signed)
IV infiltrated  Right hand iv removed and warm compressed applied to hand. Patient was moved onto a hospital bed for comfort slide board used. Family at bedside.

## 2016-12-21 NOTE — ED Notes (Signed)
Patient is sleeping  

## 2016-12-21 NOTE — H&P (Signed)
Activation and Reason: consult, fall  Primary Survey: airway intact, breath sounds bilaterally, pulses intact  Jimmy Black is an 56 y.o. male.  HPI: 56 yo male was walking back from his nephews hockey practice at a park leaning on the rail and did not realize there were steps and the rail ended. He lost his balance and fell down 3-4 stairs landing on his back. He complains of back pain that radiates to his front. He denies loss of consciousness. He denies alcohol or illicit drug involvement.  Past Medical History:  Diagnosis Date  . Alcohol abuse   . Anxiety    takes Paxil daily  . Chicken pox   . Family history of anesthesia complication    brother got sick after anesthesia  . Hyperlipidemia    takes Fish Oil daily  . Joint pain   . Joint swelling   . Numbness    both leg and feet  . Osteoarthritis    "right knee; both hips" (12/03/2012)  . Peripheral neuropathy     Past Surgical History:  Procedure Laterality Date  . EXCISIONAL TOTAL KNEE ARTHROPLASTY Right 12/03/2012   Procedure: EXCISIONAL TOTAL KNEE ARTHROPLASTY (POLY SWAP), SCAR TISSUE REMOVAL, QUADRICEPSPLASTY;  Surgeon: Vickey Huger, MD;  Location: [facility];  Service: Orthopedics;  Laterality: Right;  . INCISION AND DRAINAGE OF WOUND Right 2006   "w/jet lavage; had to do this twice after the OR" (12/03/2012)  . [place]   "benign tumor removal" (12/03/2012)  . SHOULDER ARTHROSCOPY WITH OPEN ROTATOR CUFF REPAIR Right 2006  . SHOULDER ARTHROSCOPY WITH OPEN ROTATOR CUFF REPAIR Left 06/2012  . TOTAL KNEE ARTHROPLASTY  11/16/2011   Procedure: TOTAL KNEE ARTHROPLASTY;  Surgeon: Kerin Salen, MD;  Location: [facility];  Service: Orthopedics;  Laterality: Right;  right total knee arthroplasty  . TOTAL KNEE REVISION WITH SCAR DEBRIDEMENT/PATELLA REVISION WITH POLY EXCHANGE Right 12/03/2012   "not sure what they did exactly" (10/'13/2014)    Family History  Problem Relation Age of Onset  .  Cancer Mother 34       breast  . Hyperlipidemia Mother   . Heart disease Mother   . Cancer Father        prostate  . Hyperlipidemia Brother   . Cancer Maternal Aunt        breast  . Alcohol abuse Paternal Aunt   . Heart disease Maternal Grandfather   . Alcohol abuse Paternal Grandfather   . Heart disease Paternal Grandfather     Social History:  reports that he has never smoked. He has quit using smokeless tobacco. His smokeless tobacco use included Snuff and Chew. He reports that he drinks alcohol. He reports that he does not use drugs.  Allergies: No Known Allergies  Medications: I have reviewed the patient's current medications.  Results for orders placed or performed during the hospital encounter of 12/20/16 (from the past 48 hour(s))  Basic metabolic panel     Status: Abnormal   Collection Time: 12/21/16 12:12 AM  Result Value Ref Range   Sodium 136 135 - 145 mmol/L   Potassium 5.0 3.5 - 5.1 mmol/L    Comment: SPECIMEN HEMOLYZED. HEMOLYSIS MAY AFFECT INTEGRITY OF RESULTS.   Chloride 103 101 - 111 mmol/L   CO2 24 22 - 32 mmol/L   Glucose, Bld 132 (H) 65 - 99 mg/dL   BUN 28 (H) 6 - 20 mg/dL   Creatinine, Ser 1.09 0.61 - 1.24 mg/dL  Calcium 9.3 8.9 - 10.3 mg/dL   GFR calc non Af Amer >60 >60 mL/min   GFR calc Af Amer >60 >60 mL/min    Comment: (NOTE) The eGFR has been calculated using the CKD EPI equation. This calculation has not been validated in all clinical situations. eGFR's persistently <60 mL/min signify possible Chronic Kidney Disease.    Anion gap 9 5 - 15  CBC with Differential/Platelet     Status: Abnormal   Collection Time: 12/21/16 12:12 AM  Result Value Ref Range   WBC 21.6 (H) 4.0 - 10.5 K/uL   RBC 4.41 4.22 - 5.81 MIL/uL   Hemoglobin 13.3 13.0 - 17.0 g/dL   HCT 38.3 (L) 39.0 - 52.0 %   MCV 86.8 78.0 - 100.0 fL   MCH 30.2 26.0 - 34.0 pg   MCHC 34.7 30.0 - 36.0 g/dL   RDW 13.3 11.5 - 15.5 %   Platelets 252 150 - 400 K/uL   Neutrophils Relative %  86 %   Neutro Abs 18.5 (H) 1.7 - 7.7 K/uL   Lymphocytes Relative 8 %   Lymphs Abs 1.7 0.7 - 4.0 K/uL   Monocytes Relative 6 %   Monocytes Absolute 1.4 (H) 0.1 - 1.0 K/uL   Eosinophils Relative 0 %   Eosinophils Absolute 0.1 0.0 - 0.7 K/uL   Basophils Relative 0 %   Basophils Absolute 0.1 0.0 - 0.1 K/uL  Ethanol     Status: None   Collection Time: 12/21/16 12:12 AM  Result Value Ref Range   Alcohol, Ethyl (B) <10 <10 mg/dL    Comment:        LOWEST DETECTABLE LIMIT FOR SERUM ALCOHOL IS 10 mg/dL FOR MEDICAL PURPOSES ONLY   Protime-INR     Status: None   Collection Time: 12/21/16 12:12 AM  Result Value Ref Range   Prothrombin Time 13.4 11.4 - 15.2 seconds   INR 1.03     Ct Chest W Contrast  Result Date: 12/21/2016 CLINICAL DATA:  Blunt chest and abdominal trauma. Fall ledge. Left-sided chest pain. EXAM: CT CHEST, ABDOMEN, AND PELVIS WITH CONTRAST TECHNIQUE: Multidetector CT imaging of the chest, abdomen and pelvis was performed following the standard protocol during bolus administration of intravenous contrast. CONTRAST:  175m ISOVUE-300 IOPAMIDOL (ISOVUE-300) INJECTION 61% COMPARISON:  Chest radiograph earlier this day. FINDINGS: CT CHEST FINDINGS Cardiovascular: No acute aortic injury. Normal heart size. No pericardial fluid. Mediastinum/Nodes: No mediastinal hemorrhage or hematoma. No pneumomediastinum. No adenopathy. The esophagus is decompressed. Lungs/Pleura: Patchy and ground-glass opacities in the left lower lobe may be atelectasis or pulmonary contusion. Small left hemothorax. No pneumothorax. Hypoventilatory changes at the right lung base. Musculoskeletal: Minimally displaced fractures of posterior left posterior fifth through eighth ribs. Additional nondisplaced posterior left ninth rib fracture. Nondisplaced fractures of anterior third and sixth ribs, making the sixth rib fracture segmental. Sternum, thoracic spine, included clavicles and shoulder girdles are intact. CT ABDOMEN  PELVIS FINDINGS Hepatobiliary: Moderate perihepatic fluid which is high density. No evidence of hepatic laceration or hematoma. Subcentimeter low-density lesion with peripheral calcification in the subcapsular right hepatic lobe. Gallbladder physiologically distended, no calcified stone. No biliary dilatation. Pancreas: No ductal dilatation or inflammation. Spleen: Heterogeneous low-density in the inferior spleen with multiple foci of rounded high density consistent with pseudo aneurysms or intrasplenic bleeding measuring up to 9 mm. High-density does not persist on delayed phase imaging. Moderate perisplenic blood. Adrenals/Urinary Tract: No adrenal hemorrhage or renal injury identified. Bladder is unremarkable. Stomach/Bowel: No bowel wall thickening  or mesenteric hematoma. Moderate colonic stool burden. Normal appendix. Vascular/Lymphatic: Abdominal aorta and IVC are intact. No retroperitoneal fluid. No adenopathy. Reproductive: Prostatic calcifications. Other: Blood tracks in both pericolic gutters into the pelvis. There is no free air. Musculoskeletal: No fracture of the lumbar spine or bony pelvis. Multilevel degenerative change throughout the spine. Age advanced degenerative change of both hips. IMPRESSION: 1. Multiple left rib fractures, involving both anterior and posterior ribs, involving the third through ninth ribs. Posterior fifth through eighth rib fractures are minimally displaced. Sixth rib fracture is segmental. Small left hemothorax with pulmonary contusion versus atelectasis in the left lower lobe. No pneumothorax. 2. Grade 3 splenic injury with intraparenchymal hematoma and contained vascular injury with intra splenic pseudoaneurysms. Moderate perisplenic blood. 3. Moderate perihepatic blood without focal hepatic injury, may be tracking from the splenic injury. Blood tracks in both pericolic gutters into the pelvis. 4. Subcentimeter subcapsular hypodense liver lesion with peripheral  calcification, too small to characterize. Consider nonemergent MRI follow-up after resolution of acute event. Critical Value/emergent results were called by telephone at the time of interpretation on 12/21/2016 at 2:13 am to Dr. Ripley Fraise , who verbally acknowledged these results. Electronically Signed   By: Jeb Levering M.D.   On: 12/21/2016 02:14   Ct Abdomen Pelvis W Contrast  Result Date: 12/21/2016 CLINICAL DATA:  Blunt chest and abdominal trauma. Fall ledge. Left-sided chest pain. EXAM: CT CHEST, ABDOMEN, AND PELVIS WITH CONTRAST TECHNIQUE: Multidetector CT imaging of the chest, abdomen and pelvis was performed following the standard protocol during bolus administration of intravenous contrast. CONTRAST:  180m ISOVUE-300 IOPAMIDOL (ISOVUE-300) INJECTION 61% COMPARISON:  Chest radiograph earlier this day. FINDINGS: CT CHEST FINDINGS Cardiovascular: No acute aortic injury. Normal heart size. No pericardial fluid. Mediastinum/Nodes: No mediastinal hemorrhage or hematoma. No pneumomediastinum. No adenopathy. The esophagus is decompressed. Lungs/Pleura: Patchy and ground-glass opacities in the left lower lobe may be atelectasis or pulmonary contusion. Small left hemothorax. No pneumothorax. Hypoventilatory changes at the right lung base. Musculoskeletal: Minimally displaced fractures of posterior left posterior fifth through eighth ribs. Additional nondisplaced posterior left ninth rib fracture. Nondisplaced fractures of anterior third and sixth ribs, making the sixth rib fracture segmental. Sternum, thoracic spine, included clavicles and shoulder girdles are intact. CT ABDOMEN PELVIS FINDINGS Hepatobiliary: Moderate perihepatic fluid which is high density. No evidence of hepatic laceration or hematoma. Subcentimeter low-density lesion with peripheral calcification in the subcapsular right hepatic lobe. Gallbladder physiologically distended, no calcified stone. No biliary dilatation. Pancreas: No  ductal dilatation or inflammation. Spleen: Heterogeneous low-density in the inferior spleen with multiple foci of rounded high density consistent with pseudo aneurysms or intrasplenic bleeding measuring up to 9 mm. High-density does not persist on delayed phase imaging. Moderate perisplenic blood. Adrenals/Urinary Tract: No adrenal hemorrhage or renal injury identified. Bladder is unremarkable. Stomach/Bowel: No bowel wall thickening or mesenteric hematoma. Moderate colonic stool burden. Normal appendix. Vascular/Lymphatic: Abdominal aorta and IVC are intact. No retroperitoneal fluid. No adenopathy. Reproductive: Prostatic calcifications. Other: Blood tracks in both pericolic gutters into the pelvis. There is no free air. Musculoskeletal: No fracture of the lumbar spine or bony pelvis. Multilevel degenerative change throughout the spine. Age advanced degenerative change of both hips. IMPRESSION: 1. Multiple left rib fractures, involving both anterior and posterior ribs, involving the third through ninth ribs. Posterior fifth through eighth rib fractures are minimally displaced. Sixth rib fracture is segmental. Small left hemothorax with pulmonary contusion versus atelectasis in the left lower lobe. No pneumothorax. 2. Grade 3 splenic injury  with intraparenchymal hematoma and contained vascular injury with intra splenic pseudoaneurysms. Moderate perisplenic blood. 3. Moderate perihepatic blood without focal hepatic injury, may be tracking from the splenic injury. Blood tracks in both pericolic gutters into the pelvis. 4. Subcentimeter subcapsular hypodense liver lesion with peripheral calcification, too small to characterize. Consider nonemergent MRI follow-up after resolution of acute event. Critical Value/emergent results were called by telephone at the time of interpretation on 12/21/2016 at 2:13 am to Dr. Ripley Fraise , who verbally acknowledged these results. Electronically Signed   By: Jeb Levering M.D.    On: 12/21/2016 02:14   Dg Chest Port 1 View  Result Date: 12/21/2016 CLINICAL DATA:  56 year old male with fall and chest pain. EXAM: PORTABLE CHEST 1 VIEW COMPARISON:  Chest radiograph dated 11/28/2012 FINDINGS: Bibasilar atelectatic changes more prominent on the left. There is no focal consolidation, pleural effusion, or pneumothorax. There is apparent discontinuity of left posterior seventh and eighth ribs. Correlation with clinical exam and site of chest pain recommended. The densities over the left lung base may also represent areas of pulmonary contusion secondary to rib fracture. The cardiac silhouette is within normal limits. IMPRESSION: Findings suspicious for fractures of the left posterior seventh and eighth ribs with subsegmental left lung base atelectasis or contusion. Clinical correlation is recommended. No pneumothorax. Electronically Signed   By: Anner Crete M.D.   On: 12/21/2016 00:32    Review of Systems  Constitutional: Negative for chills and fever.  HENT: Negative for hearing loss.   Eyes: Negative for blurred vision and double vision.  Respiratory: Negative for cough and hemoptysis.   Cardiovascular: Positive for chest pain. Negative for palpitations.  Gastrointestinal: Positive for abdominal pain. Negative for nausea and vomiting.  Genitourinary: Negative for dysuria and urgency.  Musculoskeletal: Positive for back pain and joint pain. Negative for myalgias and neck pain.  Skin: Negative for itching and rash.  Neurological: Negative for dizziness, tingling and headaches.  Endo/Heme/Allergies: Does not bruise/bleed easily.  Psychiatric/Behavioral: Negative for depression and suicidal ideas.   Blood pressure 106/75, pulse 81, temperature 98.6 F (37 C), temperature source Oral, resp. rate 17, SpO2 96 %. Physical Exam  Constitutional: He is oriented to person, place, and time. He appears well-developed and well-nourished.  HENT:  Head: Not microcephalic. Head is  without raccoon's eyes, without abrasion and without contusion.  Right Ear: No drainage or swelling. No foreign bodies.  Left Ear: No drainage or swelling. No foreign bodies.  Nose: No mucosal edema, rhinorrhea or nose lacerations.  Mouth/Throat: Oropharynx is clear and moist and mucous membranes are normal.  Eyes: Pupils are equal, round, and reactive to light. EOM are normal. Right eye exhibits no discharge. Left eye exhibits no discharge.  Neck: Neck supple.  Cardiovascular: Normal rate and regular rhythm.   Pulses:      Carotid pulses are 2+ on the right side, and 2+ on the left side.      Radial pulses are 2+ on the right side, and 2+ on the left side.       Dorsalis pedis pulses are 2+ on the right side, and 2+ on the left side.  Respiratory: Breath sounds normal. No apnea. He has no decreased breath sounds. He has no wheezes. He has no rhonchi. He has no rales.  GI: He exhibits no shifting dullness and no distension. There is tenderness. There is no rigidity, no guarding, no tenderness at McBurney's point and negative Murphy's sign.  Neurological: He is alert and oriented to  person, place, and time. He has normal strength. No cranial nerve deficit or sensory deficit. GCS eye subscore is 4. GCS verbal subscore is 5. GCS motor subscore is 6.  Skin:  Scraps to left hand and bilateral ankles  Psychiatric: His speech is normal and behavior is normal. Thought content normal. His mood appears anxious.      Assessment/Plan: 56 yo male with chronic pain presents with fall with multiple left rib fractures, grade III splenic laceration as well as moderate hematoma under right diaphragm -admit to trauma, stepdown -serial H+H -pain control -respiratory care -discussed possible need for intervention for splenic injury  Procedures: none  Jimmy Black 12/21/2016, 3:14 AM

## 2016-12-21 NOTE — Plan of Care (Signed)
Problem: Pain Managment: Goal: General experience of comfort will improve Outcome: Progressing Pt pain 10/10; being managed with severe/moderate pain meds

## 2016-12-21 NOTE — ED Provider Notes (Signed)
[facility] DEPART[facility] Provider Note   CSN: [id number] Arrival date & time: 12/20/16  2316     History   Chief Complaint Chief Complaint  Patient presents with  . Fall    HPI Jimmy Black is a 56 y.o. male.  The history is provided by the patient.  Fall  This is a new problem. The current episode started 3 to 5 hours ago. The problem occurs constantly. The problem has been rapidly worsening. Associated symptoms include chest pain, abdominal pain and shortness of breath. Pertinent negatives include no headaches. Exacerbated by: positions. Nothing relieves the symptoms. He has tried rest for the symptoms. The treatment provided no relief.   Pt with h/o ETOH abuse, chronic pain on suboxone, presents s/p fall He reports he fell about 3 feet directly on back/left scapula No LOC No Head injury No neck pain He reports he thought there was a step and fell This occurred in [place] and he then drove here No extremity pain/trauma No focal weakness Past Medical History:  Diagnosis Date  . Alcohol abuse   . Anxiety    takes Paxil daily  . Chicken pox   . Family history of anesthesia complication    brother got sick after anesthesia  . Hyperlipidemia    takes Fish Oil daily  . Joint pain   . Joint swelling   . Numbness    both leg and feet  . Osteoarthritis    "right knee; both hips" (12/03/2012)  . Peripheral neuropathy     Patient Active Problem List   Diagnosis Date Noted  . Obstructive sleep apnea 06/21/2015  . Allergic rhinitis 06/21/2015  . Arthritis of right knee 11/16/2011  . History of alcohol abuse 08/17/2011  . Anxiety state 03/07/2009  . KNEE PAIN, RIGHT 03/07/2009    Past Surgical History:  Procedure Laterality Date  . EXCISIONAL TOTAL KNEE ARTHROPLASTY Right 12/03/2012   Procedure: EXCISIONAL TOTAL KNEE ARTHROPLASTY (POLY SWAP), SCAR TISSUE REMOVAL, QUADRICEPSPLASTY;  Surgeon: Vickey Huger, MD;  Location: [facility];   Service: Orthopedics;  Laterality: Right;  . INCISION AND DRAINAGE OF WOUND Right 2006   "w/jet lavage; had to do this twice after the OR" (12/03/2012)  . [place]   "benign tumor removal" (12/03/2012)  . SHOULDER ARTHROSCOPY WITH OPEN ROTATOR CUFF REPAIR Right 2006  . SHOULDER ARTHROSCOPY WITH OPEN ROTATOR CUFF REPAIR Left 06/2012  . TOTAL KNEE ARTHROPLASTY  11/16/2011   Procedure: TOTAL KNEE ARTHROPLASTY;  Surgeon: Kerin Salen, MD;  Location: [facility];  Service: Orthopedics;  Laterality: Right;  right total knee arthroplasty  . TOTAL KNEE REVISION WITH SCAR DEBRIDEMENT/PATELLA REVISION WITH POLY EXCHANGE Right 12/03/2012   "not sure what they did exactly" (10/'13/2014)       Home Medications    Prior to Admission medications   Medication Sig Start Date End Date Taking? Authorizing Provider  Buprenorphine HCl-Naloxone HCl (SUBOXONE) 8-2 MG FILM Place 1 Film under the tongue 3 (three) times daily. Prescribed by Dr. Christie Nottingham 06/27/12   [provider]  gabapentin (NEURONTIN) 300 MG capsule Take 1 tablet by mouth at night for 3 days, then increase to twice per day. 09/23/15   Burchette, Alinda Sierras, MD  Garlic 062 MG TABS Take by mouth.    [provider]  ibuprofen (ADVIL,MOTRIN) 200 MG tablet Take 600 mg by mouth daily as needed for pain.    [provider]  Multiple Vitamins-Minerals (MENS MULTI VITAMIN &  MINERAL PO) Take 1 tablet by mouth daily.     [provider]  niacin 250 MG tablet Take 250 mg by mouth daily.    [provider]  Omega-3 Fatty Acids (FISH OIL CONCENTRATE PO) Take by mouth.    [provider]  PARoxetine (PAXIL) 40 MG tablet Take 40 mg by mouth daily.     [provider]  rosuvastatin (CRESTOR) 20 MG tablet Take 1 tablet (20 mg total) by mouth daily. 11/06/14   Burchette, Alinda Sierras, MD    Family History Family History  Problem Relation Age of Onset  . Cancer Mother 2       breast  .  Hyperlipidemia Mother   . Heart disease Mother   . Cancer Father        prostate  . Hyperlipidemia Brother   . Cancer Maternal Aunt        breast  . Alcohol abuse Paternal Aunt   . Heart disease Maternal Grandfather   . Alcohol abuse Paternal Grandfather   . Heart disease Paternal Grandfather     Social History Social History  Substance Use Topics  . Smoking status: Never Smoker  . Smokeless tobacco: Former Systems developer    Types: Snuff, Chew     Comment: 12/03/2012 "stopped smokeless tobacco in the early 1990's"  . Alcohol use Yes     Comment: 12/03/2012 "quit all alcohol in 1998; family hx of problems w/it"     Allergies   Patient has no known allergies.   Review of Systems Review of Systems  Constitutional: Negative for fever.  Respiratory: Positive for shortness of breath.   Cardiovascular: Positive for chest pain.  Gastrointestinal: Positive for abdominal pain.  Musculoskeletal: Negative for neck pain.  Neurological: Negative for headaches.  All other systems reviewed and are negative.    Physical Exam Updated Vital Signs BP 107/83   Pulse 72   Temp 98.6 F (37 C) (Oral)   Resp 18   SpO2 98%   Physical Exam CONSTITUTIONAL: anxious, ill appearing HEAD: Normocephalic/atraumatic EYES: EOMI/PERRL ENMT: Mucous membranes moist, No evidence of facial/nasal trauma NECK: supple no meningeal signs SPINE/BACK:entire spine nontender, No bruising/crepitance/stepoffs noted to spine Tenderness to left scapula, no bruising noted Chest - no crepitus or deformities noted CV: S1/S2 noted, no murmurs/rubs/gallops noted LUNGS: Lungs are clear to auscultation bilaterally, no apparent distress ABDOMEN: soft, tenderness to LUQ, scattered old bruising to abdominal wall, no rebound or guarding, bowel sounds noted throughout abdomen GU:no cva tenderness NEURO: Pt is awake/alert/appropriate, moves all extremitiesx4.  No facial droop.  GCS 15 EXTREMITIES: pulses normal/equal, full ROM,  scattered abrasions to extremiies, no deformities/tenderness, pelvis stable SKIN: pale PSYCH: anxious   ED Treatments / Results  Labs (all labs ordered are listed, but only abnormal results are displayed) Labs Reviewed  BASIC METABOLIC PANEL - Abnormal; Notable for the following:       Result Value   Glucose, Bld 132 (*)    BUN 28 (*)    All other components within normal limits  CBC WITH DIFFERENTIAL/PLATELET - Abnormal; Notable for the following:    WBC 21.6 (*)    HCT 38.3 (*)    Neutro Abs 18.5 (*)    Monocytes Absolute 1.4 (*)    All other components within normal limits  ETHANOL  PROTIME-INR  CDS SEROLOGY  URINALYSIS, ROUTINE W REFLEX MICROSCOPIC  RAPID URINE DRUG SCREEN, HOSP PERFORMED  SAMPLE TO BLOOD BANK  TYPE AND SCREEN    EKG  EKG Interpretation  Date/Time:  Wednesday December 21 2016 00:23:47 EDT Ventricular Rate:  98 PR Interval:    QRS Duration: 90 QT Interval:  358 QTC Calculation: 458 R Axis:   45 Text Interpretation:  Sinus rhythm No significant change since last tracing Confirmed by Ripley Fraise [phone]) on 12/21/2016 12:29:41 AM       Radiology Ct Chest W Contrast  Result Date: 12/21/2016 CLINICAL DATA:  Blunt chest and abdominal trauma. Fall ledge. Left-sided chest pain. EXAM: CT CHEST, ABDOMEN, AND PELVIS WITH CONTRAST TECHNIQUE: Multidetector CT imaging of the chest, abdomen and pelvis was performed following the standard protocol during bolus administration of intravenous contrast. CONTRAST:  170mL ISOVUE-300 IOPAMIDOL (ISOVUE-300) INJECTION 61% COMPARISON:  Chest radiograph earlier this day. FINDINGS: CT CHEST FINDINGS Cardiovascular: No acute aortic injury. Normal heart size. No pericardial fluid. Mediastinum/Nodes: No mediastinal hemorrhage or hematoma. No pneumomediastinum. No adenopathy. The esophagus is decompressed. Lungs/Pleura: Patchy and ground-glass opacities in the left lower lobe may be atelectasis or pulmonary contusion. Small  left hemothorax. No pneumothorax. Hypoventilatory changes at the right lung base. Musculoskeletal: Minimally displaced fractures of posterior left posterior fifth through eighth ribs. Additional nondisplaced posterior left ninth rib fracture. Nondisplaced fractures of anterior third and sixth ribs, making the sixth rib fracture segmental. Sternum, thoracic spine, included clavicles and shoulder girdles are intact. CT ABDOMEN PELVIS FINDINGS Hepatobiliary: Moderate perihepatic fluid which is high density. No evidence of hepatic laceration or hematoma. Subcentimeter low-density lesion with peripheral calcification in the subcapsular right hepatic lobe. Gallbladder physiologically distended, no calcified stone. No biliary dilatation. Pancreas: No ductal dilatation or inflammation. Spleen: Heterogeneous low-density in the inferior spleen with multiple foci of rounded high density consistent with pseudo aneurysms or intrasplenic bleeding measuring up to 9 mm. High-density does not persist on delayed phase imaging. Moderate perisplenic blood. Adrenals/Urinary Tract: No adrenal hemorrhage or renal injury identified. Bladder is unremarkable. Stomach/Bowel: No bowel wall thickening or mesenteric hematoma. Moderate colonic stool burden. Normal appendix. Vascular/Lymphatic: Abdominal aorta and IVC are intact. No retroperitoneal fluid. No adenopathy. Reproductive: Prostatic calcifications. Other: Blood tracks in both pericolic gutters into the pelvis. There is no free air. Musculoskeletal: No fracture of the lumbar spine or bony pelvis. Multilevel degenerative change throughout the spine. Age advanced degenerative change of both hips. IMPRESSION: 1. Multiple left rib fractures, involving both anterior and posterior ribs, involving the third through ninth ribs. Posterior fifth through eighth rib fractures are minimally displaced. Sixth rib fracture is segmental. Small left hemothorax with pulmonary contusion versus atelectasis  in the left lower lobe. No pneumothorax. 2. Grade 3 splenic injury with intraparenchymal hematoma and contained vascular injury with intra splenic pseudoaneurysms. Moderate perisplenic blood. 3. Moderate perihepatic blood without focal hepatic injury, may be tracking from the splenic injury. Blood tracks in both pericolic gutters into the pelvis. 4. Subcentimeter subcapsular hypodense liver lesion with peripheral calcification, too small to characterize. Consider nonemergent MRI follow-up after resolution of acute event. Critical Value/emergent results were called by telephone at the time of interpretation on 12/21/2016 at 2:13 am to Dr. Ripley Fraise , who verbally acknowledged these results. Electronically Signed   By: Jeb Levering M.D.   On: 12/21/2016 02:14   Ct Abdomen Pelvis W Contrast  Result Date: 12/21/2016 CLINICAL DATA:  Blunt chest and abdominal trauma. Fall ledge. Left-sided chest pain. EXAM: CT CHEST, ABDOMEN, AND PELVIS WITH CONTRAST TECHNIQUE: Multidetector CT imaging of the chest, abdomen and pelvis was performed following the standard protocol during bolus administration of intravenous contrast. CONTRAST:  151mL ISOVUE-300 IOPAMIDOL (ISOVUE-300) INJECTION 61% COMPARISON:  Chest radiograph earlier this day. FINDINGS: CT CHEST FINDINGS Cardiovascular: No acute aortic injury. Normal heart size. No pericardial fluid. Mediastinum/Nodes: No mediastinal hemorrhage or hematoma. No pneumomediastinum. No adenopathy. The esophagus is decompressed. Lungs/Pleura: Patchy and ground-glass opacities in the left lower lobe may be atelectasis or pulmonary contusion. Small left hemothorax. No pneumothorax. Hypoventilatory changes at the right lung base. Musculoskeletal: Minimally displaced fractures of posterior left posterior fifth through eighth ribs. Additional nondisplaced posterior left ninth rib fracture. Nondisplaced fractures of anterior third and sixth ribs, making the sixth rib fracture  segmental. Sternum, thoracic spine, included clavicles and shoulder girdles are intact. CT ABDOMEN PELVIS FINDINGS Hepatobiliary: Moderate perihepatic fluid which is high density. No evidence of hepatic laceration or hematoma. Subcentimeter low-density lesion with peripheral calcification in the subcapsular right hepatic lobe. Gallbladder physiologically distended, no calcified stone. No biliary dilatation. Pancreas: No ductal dilatation or inflammation. Spleen: Heterogeneous low-density in the inferior spleen with multiple foci of rounded high density consistent with pseudo aneurysms or intrasplenic bleeding measuring up to 9 mm. High-density does not persist on delayed phase imaging. Moderate perisplenic blood. Adrenals/Urinary Tract: No adrenal hemorrhage or renal injury identified. Bladder is unremarkable. Stomach/Bowel: No bowel wall thickening or mesenteric hematoma. Moderate colonic stool burden. Normal appendix. Vascular/Lymphatic: Abdominal aorta and IVC are intact. No retroperitoneal fluid. No adenopathy. Reproductive: Prostatic calcifications. Other: Blood tracks in both pericolic gutters into the pelvis. There is no free air. Musculoskeletal: No fracture of the lumbar spine or bony pelvis. Multilevel degenerative change throughout the spine. Age advanced degenerative change of both hips. IMPRESSION: 1. Multiple left rib fractures, involving both anterior and posterior ribs, involving the third through ninth ribs. Posterior fifth through eighth rib fractures are minimally displaced. Sixth rib fracture is segmental. Small left hemothorax with pulmonary contusion versus atelectasis in the left lower lobe. No pneumothorax. 2. Grade 3 splenic injury with intraparenchymal hematoma and contained vascular injury with intra splenic pseudoaneurysms. Moderate perisplenic blood. 3. Moderate perihepatic blood without focal hepatic injury, may be tracking from the splenic injury. Blood tracks in both pericolic gutters  into the pelvis. 4. Subcentimeter subcapsular hypodense liver lesion with peripheral calcification, too small to characterize. Consider nonemergent MRI follow-up after resolution of acute event. Critical Value/emergent results were called by telephone at the time of interpretation on 12/21/2016 at 2:13 am to Dr. Ripley Fraise , who verbally acknowledged these results. Electronically Signed   By: Jeb Levering M.D.   On: 12/21/2016 02:14   Dg Chest Port 1 View  Result Date: 12/21/2016 CLINICAL DATA:  56 year old male with fall and chest pain. EXAM: PORTABLE CHEST 1 VIEW COMPARISON:  Chest radiograph dated 11/28/2012 FINDINGS: Bibasilar atelectatic changes more prominent on the left. There is no focal consolidation, pleural effusion, or pneumothorax. There is apparent discontinuity of left posterior seventh and eighth ribs. Correlation with clinical exam and site of chest pain recommended. The densities over the left lung base may also represent areas of pulmonary contusion secondary to rib fracture. The cardiac silhouette is within normal limits. IMPRESSION: Findings suspicious for fractures of the left posterior seventh and eighth ribs with subsegmental left lung base atelectasis or contusion. Clinical correlation is recommended. No pneumothorax. Electronically Signed   By: Anner Crete M.D.   On: 12/21/2016 00:32    Procedures Procedures   CRITICAL CARE Performed by: Sharyon Cable Total critical care time: 35 minutes Critical care time was exclusive of separately billable procedures and treating other patients. Critical  care was necessary to treat or prevent imminent or life-threatening deterioration. Critical care was time spent personally by me on the following activities: development of treatment plan with patient and/or surrogate as well as nursing, discussions with consultants, evaluation of patient's response to treatment, examination of patient, obtaining history from patient or  surrogate, ordering and performing treatments and interventions, ordering and review of laboratory studies, ordering and review of radiographic studies, pulse oximetry and re-evaluation of patient's condition. PATIENT WITH RIB FRACTURES, SPLENIC LACERATION ADMITTED TO TRAUMA Medications Ordered in ED Medications  iopamidol (ISOVUE-300) 61 % injection (not administered)  sodium chloride 0.9 % bolus 1,000 mL (not administered)  LORazepam (ATIVAN) injection 1 mg (1 mg Intravenous Given 12/21/16 0013)  Tdap (BOOSTRIX) injection 0.5 mL (0.5 mLs Intramuscular Given 12/21/16 0012)  LORazepam (ATIVAN) injection 0.5 mg (0.5 mg Intravenous Given 12/21/16 0112)  sodium chloride 0.9 % bolus 1,000 mL (1,000 mLs Intravenous New Bag/Given 12/21/16 0112)  iopamidol (ISOVUE-300) 61 % injection (100 mLs  Contrast Given 12/21/16 0125)     Initial Impression / Assessment and Plan / ED Course  I have reviewed the triage vital signs and the nursing notes.  Pertinent labs & imaging results that were available during my care of the patient were reviewed by me and considered in my medical decision making (see chart for details).     1:08 AM Pt in the ED s/p fall No signs of head or neck injury He has rib fractures Continues to have pain in LUQ Will get CT imaging chest/abd He declines pain meds, but requests ativan for anxiety 2:25 AM D/w radiology Pt with left hemothorax/rib fractures, also with splenic laceration and hemoperitoneum Pt awake/alert BP appropriate HGB stable D/w dr Kieth Brightly with trauma will admit Will monitor closely Pt denies Head injury or neck pain   Final Clinical Impressions(s) / ED Diagnoses   Final diagnoses:  Closed fracture of multiple ribs of left side, initial encounter  Hemothorax on left  Contusion of left lung, initial encounter  Laceration of spleen, initial encounter    New Prescriptions New Prescriptions   No medications on file     Ripley Fraise,  MD 12/21/16 [phone]

## 2016-12-22 LAB — CBC WITH DIFFERENTIAL/PLATELET
BASOS PCT: 0 %
Basophils Absolute: 0 10*3/uL (ref 0.0–0.1)
EOS PCT: 1 %
Eosinophils Absolute: 0.1 10*3/uL (ref 0.0–0.7)
HEMATOCRIT: 32.1 % — AB (ref 39.0–52.0)
HEMOGLOBIN: 10.7 g/dL — AB (ref 13.0–17.0)
Lymphocytes Relative: 12 %
Lymphs Abs: 1.4 10*3/uL (ref 0.7–4.0)
MCH: 29.4 pg (ref 26.0–34.0)
MCHC: 33.3 g/dL (ref 30.0–36.0)
MCV: 88.2 fL (ref 78.0–100.0)
MONO ABS: 1.3 10*3/uL — AB (ref 0.1–1.0)
MONOS PCT: 11 %
NEUTROS ABS: 8.5 10*3/uL — AB (ref 1.7–7.7)
Neutrophils Relative %: 76 %
Platelets: 181 10*3/uL (ref 150–400)
RBC: 3.64 MIL/uL — ABNORMAL LOW (ref 4.22–5.81)
RDW: 13.5 % (ref 11.5–15.5)
WBC: 11.3 10*3/uL — ABNORMAL HIGH (ref 4.0–10.5)

## 2016-12-22 LAB — BASIC METABOLIC PANEL
Anion gap: 7 (ref 5–15)
BUN: 19 mg/dL (ref 6–20)
CHLORIDE: 103 mmol/L (ref 101–111)
CO2: 26 mmol/L (ref 22–32)
CREATININE: 0.61 mg/dL (ref 0.61–1.24)
Calcium: 8.7 mg/dL — ABNORMAL LOW (ref 8.9–10.3)
GFR calc non Af Amer: >60 mL/min (ref >60)
GLUCOSE: 115 mg/dL — AB (ref 65–99)
Potassium: 4.4 mmol/L (ref 3.5–5.1)
Sodium: 136 mmol/L (ref 135–145)

## 2016-12-22 LAB — CDS SEROLOGY

## 2016-12-22 MED ORDER — MORPHINE SULFATE (PF) 4 MG/ML IV SOLN
2.0000 mg | INTRAVENOUS | Status: DC | PRN
  Administered 2016-12-22 – 2016-12-23 (×6): 4 mg via INTRAVENOUS
  Filled 2016-12-22 (×6): qty 1

## 2016-12-22 MED ORDER — OXYCODONE HCL 5 MG PO TABS
5.0000 mg | ORAL_TABLET | ORAL | Status: DC | PRN
  Administered 2016-12-22 – 2016-12-23 (×5): 10 mg via ORAL
  Filled 2016-12-22 (×5): qty 2

## 2016-12-22 NOTE — Progress Notes (Signed)
Trauma Service Note  Subjective: Patient is awake and alert and very nice.  No distress.  Still having lots of left sided chest wall pain.  Spasms.  Objective: Vital signs in last 24 hours: Temp:  [98 F (36.7 C)-98.8 F (37.1 C)] 98 F (36.7 C) (11/01 0400) Pulse Rate:  [64-112] 78 (11/01 0400) Resp:  [16-24] 21 (11/01 0400) BP: (113-180)/(71-121) 136/79 (11/01 0400) SpO2:  [91 %-96 %] 94 % (11/01 0400) Weight:  [130.7 kg (288 lb 2.3 oz)] 130.7 kg (288 lb 2.3 oz) (10/31 2004) Last BM Date: 12/20/16  Intake/Output from previous day: 10/31 0701 - 11/01 0700 In: 1955 [P.O.:330; I.V.:1500] Out: 2210 [Urine:2210] Intake/Output this shift: No intake/output data recorded.  General: No acute distress at rest  Lungs: Clear to auscultation.  Abd: Soft, good bowel sounds.  Tolerating liquids well.  Extremities: No problems or changes.  Neuro: Intact  Lab Results: CBC   Recent Labs  12/21/16 0338 12/21/16 1815  WBC 15.7* 14.5*  HGB 11.7* 11.1*  HCT 35.3* 33.2*  PLT 230 211   BMET  Recent Labs  12/21/16 0012 12/21/16 0338  NA 136 135  K 5.0 4.9  CL 103 105  CO2 24 22  GLUCOSE 132* 123*  BUN 28* 27*  CREATININE 1.09 0.88  CALCIUM 9.3 8.6*   PT/INR  Recent Labs  12/21/16 0012  LABPROT 13.4  INR 1.03   ABG No results for input(s): PHART, HCO3 in the last 72 hours.  Invalid input(s): PCO2, PO2  Studies/Results: Ct Chest W Contrast  Result Date: 12/21/2016 CLINICAL DATA:  Blunt chest and abdominal trauma. Fall ledge. Left-sided chest pain. EXAM: CT CHEST, ABDOMEN, AND PELVIS WITH CONTRAST TECHNIQUE: Multidetector CT imaging of the chest, abdomen and pelvis was performed following the standard protocol during bolus administration of intravenous contrast. CONTRAST:  157mL ISOVUE-300 IOPAMIDOL (ISOVUE-300) INJECTION 61% COMPARISON:  Chest radiograph earlier this day. FINDINGS: CT CHEST FINDINGS Cardiovascular: No acute aortic injury. Normal heart size. No  pericardial fluid. Mediastinum/Nodes: No mediastinal hemorrhage or hematoma. No pneumomediastinum. No adenopathy. The esophagus is decompressed. Lungs/Pleura: Patchy and ground-glass opacities in the left lower lobe may be atelectasis or pulmonary contusion. Small left hemothorax. No pneumothorax. Hypoventilatory changes at the right lung base. Musculoskeletal: Minimally displaced fractures of posterior left posterior fifth through eighth ribs. Additional nondisplaced posterior left ninth rib fracture. Nondisplaced fractures of anterior third and sixth ribs, making the sixth rib fracture segmental. Sternum, thoracic spine, included clavicles and shoulder girdles are intact. CT ABDOMEN PELVIS FINDINGS Hepatobiliary: Moderate perihepatic fluid which is high density. No evidence of hepatic laceration or hematoma. Subcentimeter low-density lesion with peripheral calcification in the subcapsular right hepatic lobe. Gallbladder physiologically distended, no calcified stone. No biliary dilatation. Pancreas: No ductal dilatation or inflammation. Spleen: Heterogeneous low-density in the inferior spleen with multiple foci of rounded high density consistent with pseudo aneurysms or intrasplenic bleeding measuring up to 9 mm. High-density does not persist on delayed phase imaging. Moderate perisplenic blood. Adrenals/Urinary Tract: No adrenal hemorrhage or renal injury identified. Bladder is unremarkable. Stomach/Bowel: No bowel wall thickening or mesenteric hematoma. Moderate colonic stool burden. Normal appendix. Vascular/Lymphatic: Abdominal aorta and IVC are intact. No retroperitoneal fluid. No adenopathy. Reproductive: Prostatic calcifications. Other: Blood tracks in both pericolic gutters into the pelvis. There is no free air. Musculoskeletal: No fracture of the lumbar spine or bony pelvis. Multilevel degenerative change throughout the spine. Age advanced degenerative change of both hips. IMPRESSION: 1. Multiple left rib  fractures, involving both anterior  and posterior ribs, involving the third through ninth ribs. Posterior fifth through eighth rib fractures are minimally displaced. Sixth rib fracture is segmental. Small left hemothorax with pulmonary contusion versus atelectasis in the left lower lobe. No pneumothorax. 2. Grade 3 splenic injury with intraparenchymal hematoma and contained vascular injury with intra splenic pseudoaneurysms. Moderate perisplenic blood. 3. Moderate perihepatic blood without focal hepatic injury, may be tracking from the splenic injury. Blood tracks in both pericolic gutters into the pelvis. 4. Subcentimeter subcapsular hypodense liver lesion with peripheral calcification, too small to characterize. Consider nonemergent MRI follow-up after resolution of acute event. Critical Value/emergent results were called by telephone at the time of interpretation on 12/21/2016 at 2:13 am to Dr. Ripley Fraise , who verbally acknowledged these results. Electronically Signed   By: Jeb Levering M.D.   On: 12/21/2016 02:14   Ct Abdomen Pelvis W Contrast  Result Date: 12/21/2016 CLINICAL DATA:  Blunt chest and abdominal trauma. Fall ledge. Left-sided chest pain. EXAM: CT CHEST, ABDOMEN, AND PELVIS WITH CONTRAST TECHNIQUE: Multidetector CT imaging of the chest, abdomen and pelvis was performed following the standard protocol during bolus administration of intravenous contrast. CONTRAST:  140mL ISOVUE-300 IOPAMIDOL (ISOVUE-300) INJECTION 61% COMPARISON:  Chest radiograph earlier this day. FINDINGS: CT CHEST FINDINGS Cardiovascular: No acute aortic injury. Normal heart size. No pericardial fluid. Mediastinum/Nodes: No mediastinal hemorrhage or hematoma. No pneumomediastinum. No adenopathy. The esophagus is decompressed. Lungs/Pleura: Patchy and ground-glass opacities in the left lower lobe may be atelectasis or pulmonary contusion. Small left hemothorax. No pneumothorax. Hypoventilatory changes at the right lung  base. Musculoskeletal: Minimally displaced fractures of posterior left posterior fifth through eighth ribs. Additional nondisplaced posterior left ninth rib fracture. Nondisplaced fractures of anterior third and sixth ribs, making the sixth rib fracture segmental. Sternum, thoracic spine, included clavicles and shoulder girdles are intact. CT ABDOMEN PELVIS FINDINGS Hepatobiliary: Moderate perihepatic fluid which is high density. No evidence of hepatic laceration or hematoma. Subcentimeter low-density lesion with peripheral calcification in the subcapsular right hepatic lobe. Gallbladder physiologically distended, no calcified stone. No biliary dilatation. Pancreas: No ductal dilatation or inflammation. Spleen: Heterogeneous low-density in the inferior spleen with multiple foci of rounded high density consistent with pseudo aneurysms or intrasplenic bleeding measuring up to 9 mm. High-density does not persist on delayed phase imaging. Moderate perisplenic blood. Adrenals/Urinary Tract: No adrenal hemorrhage or renal injury identified. Bladder is unremarkable. Stomach/Bowel: No bowel wall thickening or mesenteric hematoma. Moderate colonic stool burden. Normal appendix. Vascular/Lymphatic: Abdominal aorta and IVC are intact. No retroperitoneal fluid. No adenopathy. Reproductive: Prostatic calcifications. Other: Blood tracks in both pericolic gutters into the pelvis. There is no free air. Musculoskeletal: No fracture of the lumbar spine or bony pelvis. Multilevel degenerative change throughout the spine. Age advanced degenerative change of both hips. IMPRESSION: 1. Multiple left rib fractures, involving both anterior and posterior ribs, involving the third through ninth ribs. Posterior fifth through eighth rib fractures are minimally displaced. Sixth rib fracture is segmental. Small left hemothorax with pulmonary contusion versus atelectasis in the left lower lobe. No pneumothorax. 2. Grade 3 splenic injury with  intraparenchymal hematoma and contained vascular injury with intra splenic pseudoaneurysms. Moderate perisplenic blood. 3. Moderate perihepatic blood without focal hepatic injury, may be tracking from the splenic injury. Blood tracks in both pericolic gutters into the pelvis. 4. Subcentimeter subcapsular hypodense liver lesion with peripheral calcification, too small to characterize. Consider nonemergent MRI follow-up after resolution of acute event. Critical Value/emergent results were called by telephone at the time of  interpretation on 12/21/2016 at 2:13 am to Dr. Ripley Fraise , who verbally acknowledged these results. Electronically Signed   By: Jeb Levering M.D.   On: 12/21/2016 02:14   Dg Chest Port 1 View  Result Date: 12/21/2016 CLINICAL DATA:  56 year old male with fall and chest pain. EXAM: PORTABLE CHEST 1 VIEW COMPARISON:  Chest radiograph dated 11/28/2012 FINDINGS: Bibasilar atelectatic changes more prominent on the left. There is no focal consolidation, pleural effusion, or pneumothorax. There is apparent discontinuity of left posterior seventh and eighth ribs. Correlation with clinical exam and site of chest pain recommended. The densities over the left lung base may also represent areas of pulmonary contusion secondary to rib fracture. The cardiac silhouette is within normal limits. IMPRESSION: Findings suspicious for fractures of the left posterior seventh and eighth ribs with subsegmental left lung base atelectasis or contusion. Clinical correlation is recommended. No pneumothorax. Electronically Signed   By: Anner Crete M.D.   On: 12/21/2016 00:32   Ct Angio Abd/pel W/ And/or W/o  Result Date: 12/21/2016 CLINICAL DATA:  Fall down stairs with left-sided rib fractures and splenic rupture. Assessment of splenic injury performed with CT angiography to compare to prior CT evaluation. EXAM: CT ANGIOGRAPHY ABDOMEN AND PELVIS WITH CONTRAST AND WITHOUT CONTRAST TECHNIQUE:  Multidetector CT imaging of the abdomen and pelvis was performed using the standard protocol during bolus administration of intravenous contrast. Multiplanar reconstructed images and MIPs were obtained and reviewed to evaluate the vascular anatomy. CONTRAST:  100 mL Isovue 370 IV COMPARISON:  CT of the abdomen and pelvis at 0140 hours FINDINGS: VASCULAR Aorta: Normally patent abdominal aorta without evidence of injury, significant atherosclerosis or aneurysm. Celiac: Normally patent. Irregular contrast extravasation is noted at the level of inferior splenic injury with multiple areas of irregular arterial contrast extravasation identified. The nature of extravasation appears to be mostly contained within the splenic parenchyma and capsule at this time with a stable amount of perisplenic hemorrhage present adjacent to the spleen. SMA: Normally patent. Renals: Single right renal artery and 2 separate left renal arteries are normally patent. IMA: Normally patent. Inflow: Widely patent bilateral iliac arteries. Proximal Outflow: Normally patent bilateral common femoral arteries and femoral bifurcations. Veins: Venous phase imaging shows no evidence of venous injury. Review of the MIP images confirms the above findings. NON-VASCULAR Lower chest: Left lower lobe atelectasis and trace amount of left pleural fluid. Hepatobiliary: Stable amount of hemorrhage around the liver without increase since the prior CT. No evidence of parenchymal injury to the liver. Gallbladder and bile ducts are unremarkable. Pancreas: Unremarkable. No evidence of inflammation or pancreatic injury. Spleen: The appearance of grade III splenic laceration involving the inferior spleen is stable by CT compared to the prior CT approximately 10 hours ago. Amount of perisplenic hemorrhage is stable. Adrenals/Urinary Tract: Adrenal glands are unremarkable. Kidneys are normal, without renal calculi, focal lesion, or hydronephrosis. Bladder is unremarkable.  Stomach/Bowel: Bowel shows no evidence of injury or dilatation. No hemorrhage is identified in the mesentery. Lymphatic: No enlarged lymph nodes identified. Reproductive: Prostate is unremarkable. Other: No hernias identified. Musculoskeletal: Stable appearance of left seventh and eighth rib fractures. IMPRESSION: Stable CT appearance of grade III splenic laceration involving the inferior spleen with focal areas of arterial contrast extravasation identified. Size of associated hematoma appears stable without visible expansion. The amount of hemorrhage adjacent to the liver is also stable by CT. Findings were communicated directly to Dr. Hulen Skains by phone. Electronically Signed   By: Jenness Corner.D.  On: 12/21/2016 11:33    Anti-infectives: Anti-infectives    None      Assessment/Plan: s/p  Advance diet Decrease IVFs.  Transfer to floor. Maintain bedrest Incentive spirometry as ordered.  LOS: 1 day   Kathryne Eriksson. Dahlia Bailiff, MD, FACS [phone] Trauma Surgeon 12/22/2016

## 2016-12-22 NOTE — Plan of Care (Signed)
Problem: Safety: Goal: Ability to remain free from injury will improve Outcome: Progressing Pt educated about safety dealing with bed and bathroom needs  Problem: Health Behavior/Discharge Planning: Goal: Ability to manage health-related needs will improve Outcome: Progressing Pt listens to and agrees with education provided; family supportive and wants to help pt at home  Problem: Pain Managment: Goal: General experience of comfort will improve Outcome: Not Progressing Pt on morphine/oxy/robaxin and still having 10/10 pain from muscle spasms.

## 2016-12-23 ENCOUNTER — Inpatient Hospital Stay (HOSPITAL_COMMUNITY): Payer: Medicare Other

## 2016-12-23 LAB — BASIC METABOLIC PANEL
ANION GAP: 11 (ref 5–15)
BUN: 19 mg/dL (ref 6–20)
CALCIUM: 8.8 mg/dL — AB (ref 8.9–10.3)
CO2: 21 mmol/L — ABNORMAL LOW (ref 22–32)
Chloride: 102 mmol/L (ref 101–111)
Creatinine, Ser: 0.65 mg/dL (ref 0.61–1.24)
Glucose, Bld: 111 mg/dL — ABNORMAL HIGH (ref 65–99)
POTASSIUM: 4 mmol/L (ref 3.5–5.1)
SODIUM: 134 mmol/L — AB (ref 135–145)

## 2016-12-23 LAB — CBC WITH DIFFERENTIAL/PLATELET
BASOS ABS: 0 10*3/uL (ref 0.0–0.1)
BASOS PCT: 0 %
EOS ABS: 0.2 10*3/uL (ref 0.0–0.7)
EOS PCT: 1 %
HCT: 30.9 % — ABNORMAL LOW (ref 39.0–52.0)
Hemoglobin: 10.3 g/dL — ABNORMAL LOW (ref 13.0–17.0)
LYMPHS PCT: 10 %
Lymphs Abs: 1.3 10*3/uL (ref 0.7–4.0)
MCH: 29.2 pg (ref 26.0–34.0)
MCHC: 33.3 g/dL (ref 30.0–36.0)
MCV: 87.5 fL (ref 78.0–100.0)
Monocytes Absolute: 1.4 10*3/uL — ABNORMAL HIGH (ref 0.1–1.0)
Monocytes Relative: 11 %
Neutro Abs: 10.1 10*3/uL — ABNORMAL HIGH (ref 1.7–7.7)
Neutrophils Relative %: 78 %
PLATELETS: 193 10*3/uL (ref 150–400)
RBC: 3.53 MIL/uL — AB (ref 4.22–5.81)
RDW: 13.4 % (ref 11.5–15.5)
WBC: 13 10*3/uL — AB (ref 4.0–10.5)

## 2016-12-23 MED ORDER — OXYCODONE HCL 5 MG PO TABS
5.0000 mg | ORAL_TABLET | ORAL | Status: DC | PRN
  Administered 2016-12-23 – 2016-12-25 (×9): 10 mg via ORAL
  Filled 2016-12-23 (×9): qty 2

## 2016-12-23 MED ORDER — TRAMADOL HCL 50 MG PO TABS
50.0000 mg | ORAL_TABLET | Freq: Four times a day (QID) | ORAL | Status: DC
  Administered 2016-12-23 – 2016-12-25 (×8): 50 mg via ORAL
  Filled 2016-12-23 (×8): qty 1

## 2016-12-23 MED ORDER — MORPHINE SULFATE (PF) 4 MG/ML IV SOLN
2.0000 mg | INTRAVENOUS | Status: DC | PRN
  Administered 2016-12-23 – 2016-12-24 (×5): 4 mg via INTRAVENOUS
  Administered 2016-12-24: 2 mg via INTRAVENOUS
  Administered 2016-12-24 – 2016-12-25 (×3): 4 mg via INTRAVENOUS
  Filled 2016-12-23 (×9): qty 1

## 2016-12-23 MED ORDER — POLYETHYLENE GLYCOL 3350 17 G PO PACK
17.0000 g | PACK | Freq: Every day | ORAL | Status: DC
  Administered 2016-12-25: 17 g via ORAL
  Filled 2016-12-23 (×3): qty 1

## 2016-12-23 MED ORDER — LORAZEPAM 2 MG/ML IJ SOLN
0.5000 mg | Freq: Once | INTRAMUSCULAR | Status: AC
  Administered 2016-12-23: 0.5 mg via INTRAVENOUS
  Filled 2016-12-23: qty 1

## 2016-12-23 NOTE — Discharge Instructions (Signed)
Rib Fracture ° °A rib fracture is a break or crack in one of the bones of the ribs. The ribs are a group of long, curved bones that wrap around your chest and attach to your spine. They protect your lungs and other organs in the chest cavity. A broken or cracked rib is often painful, but most do not cause other problems. Most rib fractures heal on their own over time. However, rib fractures can be more serious if multiple ribs are broken or if broken ribs move out of place and push against other structures. °What are the causes? °· A direct blow to the chest. For example, this could happen during contact sports, a car accident, or a fall against a hard object. °· Repetitive movements with high force, such as pitching a baseball or having severe coughing spells. °What are the signs or symptoms? °· Pain when you breathe in or cough. °· Pain when someone presses on the injured area. °How is this diagnosed? °Your caregiver will perform a physical exam. Various imaging tests may be ordered to confirm the diagnosis and to look for related injuries. These tests may include a chest X-ray, computed tomography (CT), magnetic resonance imaging (MRI), or a bone scan. °How is this treated? °Rib fractures usually heal on their own in 1-3 months. The longer healing period is often associated with a continued cough or other aggravating activities. During the healing period, pain control is very important. Medication is usually given to control pain. Hospitalization or surgery may be needed for more severe injuries, such as those in which multiple ribs are broken or the ribs have moved out of place. °Follow these instructions at home: °· Avoid strenuous activity and any activities or movements that cause pain. Be careful during activities and avoid bumping the injured rib. °· Gradually increase activity as directed by your caregiver. °· Only take over-the-counter or prescription medications as directed by your caregiver. Do not take  other medications without asking your caregiver first. °· Apply ice to the injured area for the first 1-2 days after you have been treated or as directed by your caregiver. Applying ice helps to reduce inflammation and pain. °? Put ice in a plastic bag. °? Place a towel between your skin and the bag. °? Leave the ice on for 15-20 minutes at a time, every 2 hours while you are awake. °· Perform deep breathing as directed by your caregiver. This will help prevent pneumonia, which is a common complication of a broken rib. Your caregiver may instruct you to: °? Take deep breaths several times a day. °? Try to cough several times a day, holding a pillow against the injured area. °? Use a device called an incentive spirometer to practice deep breathing several times a day. °· Drink enough fluids to keep your urine clear or pale yellow. This will help you avoid constipation. °· Do not wear a rib belt or binder. These restrict breathing, which can lead to pneumonia. °Get help right away if: °· You have a fever. °· You have difficulty breathing or shortness of breath. °· You develop a continual cough, or you cough up thick or bloody sputum. °· You feel sick to your stomach (nausea), throw up (vomit), or have abdominal pain. °· You have worsening pain not controlled with medications. °This information is not intended to replace advice given to you by your health care provider. Make sure you discuss any questions you have with your health care provider. °Document Released: 02/07/2005   Revised: 07/16/2015 Document Reviewed: 04/11/2012 Elsevier Interactive Patient Education  [employer].    Splenic Injury A splenic injury is an injury of the spleen. The spleen is an organ located in the upper left area of your abdomen, just under your ribs. Your spleen filters and cleans your blood. It also stores blood cells and destroys cells that are worn out. Your spleen is also important for fighting disease. Splenic  injuries can vary. In some cases, the spleen may only be bruised with some bleeding inside the covering and around the spleen. Splenic injuries may also cause a deep tear or cut into the spleen (lacerated spleen). Some splenic injuries can cause the spleen to break open (rupture). What are the causes? Splenic injuries can be caused by a direct blow (blunt trauma) from:  Car accidents.  Contact sports.  Falls.  Gunshot wounds or knife wounds (penetrating injuries) can also cause a splenic injury. What increases the risk? You may be at greater risk for a splenic injury if you have a disease that can cause the spleen to become enlarged. These include:  Alcoholic liver disease.  Viral infections, especially mononucleosis.  What are the signs or symptoms? A minor splenic injury often causes no symptoms or only minor abdominal pain. If the injury causes severe bleeding, your blood pressure may rapidly decrease. This may cause:  Dizziness or light-headedness.  Rapid heart rate.  Difficulty breathing.  Fainting.  Sweating with clammy skin.  Other signs and symptoms of a splenic injury can include:  Very bad abdominal pain.  Pain in the left shoulder.  Pain when the abdomen is pressed (tenderness).  Nausea.  Swelling or bruising of the abdomen.  How is this diagnosed? Your health care provider may suspect a splenic injury based on your signs and symptoms, especially if you were recently in an accident or you recently got hurt. Your health care provider will do a physical exam. Imaging tests may be done to confirm the diagnosis. These may include:  Ultrasound.  CT scan.  You may have frequent blood tests for a few days after the injury to monitor your condition. How is this treated? Treatment depends on the type of splenic injury you have and how bad it is. Your health care provider will develop a treatment plan specific to your needs.  Less severe injuries may be treated  with: ? Observation. ? Interventional radiology. This involves using flexible tubes (catheters) to stop the bleeding from inside the blood vessel.  More severe injuries may require hospitalization in the intensive care unit (ICU). While you are in the ICU: ? Your fluid and blood levels will be monitored closely. ? You will get fluids through an IV tube as needed. ? You may need follow-up scans to check whether your spleen is able to heal itself. If the injury is getting worse, you may need surgery. ? You may receive donated blood (transfusion). ? You may have a long needle inserted into your abdomen to remove any blood that has collected inside the spleen (hematoma).  Surgery. If your blood pressure is too low, you may need emergency surgery. This may include: ? Repairing a laceration. ? Removing part of the spleen. ? Removing the entire spleen (splenectomy).  Follow these instructions at home:  Take medicines only as directed by your health care provider.  Rest at home.  Do not participate in any strenuous activity until your health care provider says it is safe to do so.  Do not  lift anything that is heavier than 10 lb (4.5 kg).  Do not participate in contact sports until your health care provider says it is safe to do so.  Stay up-to-date on vaccinations as told by your health care provider. Contact a health care provider if:  You have a fever.  You have new or increasing pain in your abdomen or in your left shoulder. Get help right away if:  You have signs or symptoms of internal bleeding. Watch for: ? Sweating. ? Dizziness. ? Weakness. ? Cold and clammy skin. ? Fainting.  You have chest pain or difficulty breathing. This information is not intended to replace advice given to you by your health care provider. Make sure you discuss any questions you have with your health care provider. Document Released: 11/29/2005 Document Revised: 10/06/2015 Document Reviewed:  10/23/2013 Elsevier Interactive Patient Education  2017 [employer].

## 2016-12-23 NOTE — Clinical Social Work Note (Signed)
Clinical Social Worker met with patient at bedside to offer support and discuss patient needs at discharge.  Patient states that he was picking his nephew up at hockey practice in [place] and missed the edge of the long walkway out to the car.  Patient seems to be slightly embarrassed, however in good spirits.  Patient lives alone but plans to discharge home with his brother once medically stable.  Clinical Social Worker inquired about current substance use.  Patient denies any drug or alcohol use at this time.  SBIRT completed.  No resources needed.  Barbette Or, [place]

## 2016-12-23 NOTE — Progress Notes (Signed)
Patient ID: Jimmy Black, male   DOB: 1961/01/10, 56 y.o.   MRN: [id number]  [facility] Surgery Progress Note     Subjective: CC-  Feeling better today than yesterday. Continues to have pain from rib fractures. Denies SOB. Abdominal pain improving. Appetite suppressed, but tolerating what he is eating. Denies n/v.   Objective: Vital signs in last 24 hours: Temp:  [98.1 F (36.7 C)-98.9 F (37.2 C)] 98.1 F (36.7 C) (11/02 0540) Pulse Rate:  [84-92] 87 (11/02 0540) Resp:  [18-20] 19 (11/02 0540) BP: (136-155)/(73-82) 155/82 (11/02 0540) SpO2:  [92 %-94 %] 92 % (11/02 0540) Weight:  [303 lb 9.2 oz (137.7 kg)] 303 lb 9.2 oz (137.7 kg) (11/01 1728) Last BM Date: 01/20/17  Intake/Output from previous day: 11/01 0701 - 11/02 0700 In: 2725 [P.O.:462; I.V.:1235] Out: 1575 [Urine:1575] Intake/Output this shift: No intake/output data recorded.  PE: Gen:  Alert, NAD, pleasant HEENT: EOM's intact, pupils equal and round Card:  RRR, no M/G/R heard Pulm:  CTAB, no W/R/R, effort normal Abd: Soft, ND, +BS, no HSM, no hernia, mild left sided abdominal tenderness with no rebound or guarding Ext:  No erythema, edema, or tenderness BUE/BLE  Psych: A&Ox3  Skin: no rashes noted, warm and dry  Lab Results:   Recent Labs  12/22/16 0905 12/23/16 0356  WBC 11.3* 13.0*  HGB 10.7* 10.3*  HCT 32.1* 30.9*  PLT 181 193   BMET  Recent Labs  12/22/16 0905 12/23/16 0356  NA 136 134*  K 4.4 4.0  CL 103 102  CO2 26 21*  GLUCOSE 115* 111*  BUN 19 19  CREATININE 0.61 0.65  CALCIUM 8.7* 8.8*   PT/INR  Recent Labs  12/21/16 0012  LABPROT 13.4  INR 1.03   CMP     Component Value Date/Time   NA 134 (L) 12/23/2016 0356   K 4.0 12/23/2016 0356   CL 102 12/23/2016 0356   CO2 21 (L) 12/23/2016 0356   GLUCOSE 111 (H) 12/23/2016 0356   BUN 19 12/23/2016 0356   CREATININE 0.65 12/23/2016 0356   CALCIUM 8.8 (L) 12/23/2016 0356   PROT 7.2 10/16/2014 0912   ALBUMIN 4.6  10/16/2014 0912   AST 25 10/16/2014 0912   ALT 31 10/16/2014 0912   ALKPHOS 68 10/16/2014 0912   BILITOT 0.7 10/16/2014 0912   GFRNONAA >60 12/23/2016 0356   GFRAA >60 12/23/2016 0356   Lipase  No results found for: LIPASE     Studies/Results: Ct Angio Abd/pel W/ And/or W/o  Result Date: 12/21/2016 CLINICAL DATA:  Fall down stairs with left-sided rib fractures and splenic rupture. Assessment of splenic injury performed with CT angiography to compare to prior CT evaluation. EXAM: CT ANGIOGRAPHY ABDOMEN AND PELVIS WITH CONTRAST AND WITHOUT CONTRAST TECHNIQUE: Multidetector CT imaging of the abdomen and pelvis was performed using the standard protocol during bolus administration of intravenous contrast. Multiplanar reconstructed images and MIPs were obtained and reviewed to evaluate the vascular anatomy. CONTRAST:  100 mL Isovue 370 IV COMPARISON:  CT of the abdomen and pelvis at 0140 hours FINDINGS: VASCULAR Aorta: Normally patent abdominal aorta without evidence of injury, significant atherosclerosis or aneurysm. Celiac: Normally patent. Irregular contrast extravasation is noted at the level of inferior splenic injury with multiple areas of irregular arterial contrast extravasation identified. The nature of extravasation appears to be mostly contained within the splenic parenchyma and capsule at this time with a stable amount of perisplenic hemorrhage present adjacent to the spleen. SMA: Normally patent. Renals:  Single right renal artery and 2 separate left renal arteries are normally patent. IMA: Normally patent. Inflow: Widely patent bilateral iliac arteries. Proximal Outflow: Normally patent bilateral common femoral arteries and femoral bifurcations. Veins: Venous phase imaging shows no evidence of venous injury. Review of the MIP images confirms the above findings. NON-VASCULAR Lower chest: Left lower lobe atelectasis and trace amount of left pleural fluid. Hepatobiliary: Stable amount of  hemorrhage around the liver without increase since the prior CT. No evidence of parenchymal injury to the liver. Gallbladder and bile ducts are unremarkable. Pancreas: Unremarkable. No evidence of inflammation or pancreatic injury. Spleen: The appearance of grade III splenic laceration involving the inferior spleen is stable by CT compared to the prior CT approximately 10 hours ago. Amount of perisplenic hemorrhage is stable. Adrenals/Urinary Tract: Adrenal glands are unremarkable. Kidneys are normal, without renal calculi, focal lesion, or hydronephrosis. Bladder is unremarkable. Stomach/Bowel: Bowel shows no evidence of injury or dilatation. No hemorrhage is identified in the mesentery. Lymphatic: No enlarged lymph nodes identified. Reproductive: Prostate is unremarkable. Other: No hernias identified. Musculoskeletal: Stable appearance of left seventh and eighth rib fractures. IMPRESSION: Stable CT appearance of grade III splenic laceration involving the inferior spleen with focal areas of arterial contrast extravasation identified. Size of associated hematoma appears stable without visible expansion. The amount of hemorrhage adjacent to the liver is also stable by CT. Findings were communicated directly to Dr. Hulen Skains by phone. Electronically Signed   By: Aletta Edouard M.D.   On: 12/21/2016 11:33    Anti-infectives: Anti-infectives    None       Assessment/Plan Fall L rib fxs 3-9 with small hemothorax - check CXR. 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 [place] for [place] with BR privileges today. Will repeat labs tomorrow and possibly advance to activity as tolerated. Mod hematoma under R diaphragm ABL anemia - Hemoglobin 10.3 today from 10.7, no hypotension or tachycardia. CBC in AM Chronic pain - on suboxone at home per psychiatrist Anxiety - paxil  ID - none FEN - KVO IVF, regular diet, add daily miralax VTE - SCDs, no chemical  DVT prophylaxis due to anemia  Plan - CXR. OOB with BR privileges. Add scheduled tramadol.   LOS: 2 days    Wellington Hampshire , [facility] Surgery 12/23/2016, 8:34 AM Pager: [phone] Consults: [phone] Mon-Fri 7:00 am-4:30 pm Sat-Sun 7:00 am-11:30 am

## 2016-12-23 NOTE — Care Management Note (Signed)
Case Management Note  Patient Details  Name: [name] MRN: [id number] Date of Birth: [birth year]  Subjective/Objective:      Pt admitted on 12/21/16 s/p fall with multiple Lt rib fracture, grade III splenic laceration and moderate hematoma under right diaphragm.  PTA, pt independent, lives alone.                Action/Plan: Pt states brother, sister in law and nephew able to assist at dc.  Pt denies any dc needs at this time.    Expected Discharge Date:  12/23/16               Expected Discharge Plan:  Home/Self Care  In-House Referral:  Clinical Social Work  Discharge planning Services  CM Consult  Post Acute Care Choice:    Choice offered to:     DME Arranged:    DME Agency:     HH Arranged:    HH Agency:     Status of Service:  In process, will continue to follow  If discussed at Long Length of Stay Meetings, dates discussed:    Additional Comments:  Reinaldo Raddle, RN, BSN  Trauma/Neuro ICU Case Manager [phone]

## 2016-12-24 LAB — CBC
HEMATOCRIT: 31.1 % — AB (ref 39.0–52.0)
Hemoglobin: 10.3 g/dL — ABNORMAL LOW (ref 13.0–17.0)
MCH: 29 pg (ref 26.0–34.0)
MCHC: 33.1 g/dL (ref 30.0–36.0)
MCV: 87.6 fL (ref 78.0–100.0)
Platelets: 200 10*3/uL (ref 150–400)
RBC: 3.55 MIL/uL — ABNORMAL LOW (ref 4.22–5.81)
RDW: 13.3 % (ref 11.5–15.5)
WBC: 12.2 10*3/uL — ABNORMAL HIGH (ref 4.0–10.5)

## 2016-12-24 MED ORDER — PAROXETINE HCL 20 MG PO TABS
40.0000 mg | ORAL_TABLET | Freq: Every day | ORAL | Status: DC
  Administered 2016-12-24 – 2016-12-25 (×2): 40 mg via ORAL
  Filled 2016-12-24 (×2): qty 2

## 2016-12-24 NOTE — Progress Notes (Signed)
    CC:  Fall  Subjective: Pain is  The big issue currently, he is hemodynamically stable right now.  Still taking IV Morphine 24 mg,  and PO pain meds.  Tolerating diet, not much PO.  Has not been out of the room.    Objective: Vital signs in last 24 hours: Temp:  [98.4 F (36.9 C)-100.8 F (38.2 C)] 98.4 F (36.9 C) (11/03 0519) Pulse Rate:  [79-99] 84 (11/03 0519) Resp:  [19-20] 20 (11/03 0519) BP: (134-149)/(81-86) 149/86 (11/03 0519) SpO2:  [93 %-98 %] 93 % (11/03 0519) Last BM Date: 01/20/17 (Pt indicated this is normal, will have one once home) 440 PO 460 IV 1900 urine TM 100.8/ VSS Pain 5-9/10 WBC stable down to 12.2 H/H 10.3/31 Platelets 200K CXR yesterday:  There is a small pleural effusion on the left with patchy atelectasis in the left base. Lungs elsewhere clear. Heart is upper normal in size with pulmonary vascularity within normal limits. No adenopathy. No evident pneumothorax. Several rib fractures on the left are better appreciated on recent chest radiograph and chest CT  Intake/Output from previous day: 11/02 0701 - 11/03 0700 In: 900 [P.O.:440; I.V.:460] Out: 1900 [Urine:1900] Intake/Output this shift: No intake/output data recorded.  General appearance: alert, cooperative, no distress and sore waiting for pain meds, up in chair.  has been to Bathroom, but not out of the room.   Resp: clear uppper chest, some rales in base on the right.   GI: soft, sore, sites all looks good.  Lab Results:   Recent Labs  12/23/16 0356 12/24/16 0637  WBC 13.0* 12.2*  HGB 10.3* 10.3*  HCT 30.9* 31.1*  PLT 193 200    BMET  Recent Labs  12/22/16 0905 12/23/16 0356  NA 136 134*  K 4.4 4.0  CL 103 102  CO2 26 21*  GLUCOSE 115* 111*  BUN 19 19  CREATININE 0.61 0.65  CALCIUM 8.7* 8.8*   PT/INR No results for input(s): LABPROT, INR in the last 72 hours.  No results for input(s): AST, ALT, ALKPHOS, BILITOT, PROT, ALBUMIN in the last 168 hours.    Lipase  No results found for: LIPASE   Medications: . acetaminophen  1,000 mg Oral Q8H  . docusate sodium  100 mg Oral BID  . Influenza vac split quadrivalent PF  0.5 mL Intramuscular Tomorrow-1000  . methocarbamol  750 mg Oral TID  . polyethylene glycol  17 g Oral Daily  . traMADol  50 mg Oral Q6H    Assessment/Plan Fall L rib fxs 3-9 with small hemothorax - check CXR. 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 [place] for [place] with BR privileges today. Hemodynamically stable,  Mod hematoma under R diaphragm ABL anemia - Hemoglobin 10.3 today from 10.7, no hypotension or tachycardia. CBC in AM Chronic pain - on suboxone at home per psychiatrist  Anxiety - paxil  ID - none FEN - KVO IVF, regular diet, add daily miralax VTE - SCDs, no chemical DVT prophylaxis due to anemia  Plan - . OOB with BR privileges. Add scheduled tramadol. Ask OT and PT to see and work on further mobilization.           LOS: 3 days    Jimmy Black 12/24/2016 [phone]

## 2016-12-25 ENCOUNTER — Other Ambulatory Visit: Payer: Self-pay

## 2016-12-25 MED ORDER — OXYCODONE HCL 5 MG PO TABS
5.0000 mg | ORAL_TABLET | ORAL | Status: DC | PRN
  Administered 2016-12-25: 15 mg via ORAL
  Filled 2016-12-25: qty 3

## 2016-12-25 MED ORDER — IBUPROFEN 600 MG PO TABS
600.0000 mg | ORAL_TABLET | Freq: Four times a day (QID) | ORAL | Status: DC
  Administered 2016-12-25: 600 mg via ORAL
  Filled 2016-12-25: qty 1

## 2016-12-25 MED ORDER — ACETAMINOPHEN 500 MG PO TABS: 1000.0000 mg | ORAL_TABLET | Freq: Three times a day (TID) | ORAL | 0 refills | Status: DC

## 2016-12-25 MED ORDER — DOCUSATE SODIUM 100 MG PO CAPS: 100.0000 mg | ORAL_CAPSULE | Freq: Two times a day (BID) | ORAL | 0 refills | Status: DC

## 2016-12-25 MED ORDER — MORPHINE SULFATE (PF) 4 MG/ML IV SOLN: 1.0000 mg | INTRAVENOUS | Status: DC | PRN

## 2016-12-25 MED ORDER — OXYCODONE HCL 5 MG PO TABS: 5.0000 mg | ORAL_TABLET | ORAL | 0 refills | Status: DC | PRN

## 2016-12-25 MED ORDER — IBUPROFEN 600 MG PO TABS: 600.0000 mg | ORAL_TABLET | Freq: Four times a day (QID) | ORAL | Status: DC | PRN

## 2016-12-25 MED ORDER — METHOCARBAMOL 750 MG PO TABS: 750.0000 mg | ORAL_TABLET | Freq: Three times a day (TID) | ORAL | 0 refills | Status: DC

## 2016-12-25 NOTE — Evaluation (Signed)
Physical Therapy Evaluation Patient Details Name: Jimmy Black MRN: [id number] DOB: 1961-02-07 Today's Date: 12/25/2016   History of Present Illness    56 yo male was walking back from his nephews hockey [place] a park leaning on the rail and did not realize there were steps and the rail ended. He lost his balance and fell down 3-4 stairs landing on his back. Sustained left rib fx 3-9, grade III splenic laceration, moderate hematoma under right diaphragm.      Clinical Impression  Pt at/near baseline level of function, currently independent with mobility ADLs despite left rib pain and guarding.  Reports improved pain today and feels ready to transition to home once cleared medically. 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  No other recommendations.     Follow Up Recommendations No PT follow up    Equipment Recommendations  None recommended by PT    Recommendations for Other Services       Precautions / Restrictions Precautions Precautions: None Restrictions Weight Bearing Restrictions: No      Mobility  Bed Mobility Overal bed mobility: Independent             General bed mobility comments: with effort able to get in/out; guards left thorax  Transfers Overall transfer level: Independent                  Ambulation/Gait Ambulation/Gait assistance: Independent Ambulation Distance (Feet): 150 Feet Assistive device: None Gait Pattern/deviations: Step-through pattern;Trunk flexed     General Gait Details: guards left thorax;   Stairs            Wheelchair Mobility    Modified Rankin (Stroke Patients Only)       Balance Overall balance assessment: No apparent balance deficits (not formally assessed)                                            Pertinent Vitals/Pain Pain Assessment: 0-10 Pain Score: 4  Pain Location: left thorax Pain Descriptors / Indicators: Aching;Sore Pain Intervention(s): Limited activity within patient's tolerance    Home Living Family/patient expects to be discharged to:: Private residence Living Arrangements: Spouse/significant other Available Help at Discharge: Family;Available PRN/intermittently Type of Home: House Home Access: Level entry     Home Layout: One level Home Equipment: None      Prior Function Level of Independence: Independent               Hand Dominance   Dominant Hand: Right    Extremity/Trunk Assessment   Upper Extremity Assessment Upper Extremity Assessment: Defer to OT evaluation(restricts use of L UE due to rib pain)    Lower Extremity Assessment Lower Extremity Assessment: Overall WFL for tasks assessed    Cervical / Trunk Assessment Cervical / Trunk Assessment: Normal  Communication   Communication: No difficulties  Cognition Arousal/Alertness: Awake/alert Behavior During Therapy: WFL for tasks assessed/performed Overall Cognitive Status: Within Functional Limits for tasks assessed                                        General Comments      Exercises  Assessment/Plan    PT Assessment Patent does not need any further PT services  PT Problem List         PT Treatment Interventions      PT Goals (Current goals can be found in the Care Plan section)  Acute Rehab PT Goals PT Goal Formulation: All assessment and education complete, DC therapy    Frequency     Barriers to discharge        Co-evaluation               AM-PAC PT "6 Clicks" Daily Activity  Outcome Measure Difficulty turning over in bed (including adjusting bedclothes, sheets and blankets)?: A Little Difficulty moving from lying on back to sitting on the side of the bed? : A Little Difficulty sitting down on and standing up from a chair  with arms (e.g., wheelchair, bedside commode, etc,.)?: None Help needed moving to and from a bed to chair (including a wheelchair)?: None Help needed walking in hospital room?: None Help needed climbing 3-5 steps with a railing? : None 6 Click Score: 22    End of Session   Activity Tolerance: Patient tolerated treatment well Patient left: in chair;with call bell/phone within reach Nurse Communication: Mobility status PT Visit Diagnosis: Pain Pain - Right/Left: Left Pain - part of body: (thorax)    Time: 7544-9201 PT Time Calculation (min) (ACUTE ONLY): 12 min   Charges:   PT Evaluation $PT Eval Low Complexity: 1 Low     PT G Codes:        Kearney Hard, PT, DPT, MS Board Certified Geriatric Clinical Specialist  Herbie Drape 12/25/2016, 10:32 AM

## 2016-12-25 NOTE — Progress Notes (Signed)
    CC:  Subjective: Pain meds: Tylenol 1 g every 8         Robaxin 750 mg q. 8                    Morphine 18 mg IV         Oxycodone 10 mg x5 doses yesterday         Tramadol 200 mg yesterday He looks fine, BS down in left base.  Pain control is the primary issue now.  He has been on Suboxone for some years for chronic pain mostly in his joints. Objective: Vital signs in last 24 hours: Temp:  [98.3 F (36.8 C)-99.4 F (37.4 C)] 98.3 F (36.8 C) (11/04 0553) Pulse Rate:  [81-97] 81 (11/04 0553) Resp:  [18-20] 18 (11/04 0553) BP: (138-157)/(83-95) 157/95 (11/04 0553) SpO2:  [96 %-97 %] 96 % (11/04 0553) Last BM Date: 12/24/16 1040 PO 1050 urine BM x 1 Afebrile, BP with diastolic in upper 90-24 range No labs CXR 11/2:  There is a small pleural effusion on the left with patchy atelectasis in the left base. Lungs elsewhere clear. Heart is upper normal in size with pulmonary vascularity within normal limits. No adenopathy. No evident pneumothorax. Several rib fractures on the left are better appreciated on recent chest radiograph and chest CT. Intake/Output from previous day: 11/03 0701 - 11/04 0700 In: 1040 [P.O.:1040] Out: 1050 [Urine:1050] Intake/Output this shift: No intake/output data recorded.  General appearance: alert, cooperative and no distress Resp: Sounds down in the left base. GI: soft, non-tender; bowel sounds normal; no masses,  no organomegaly Skin: Skin color, texture, turgor normal. No rashes or lesions or His multiple abrasions but no significant issues.  Lab Results:  Recent Labs    12/23/16 0356 12/24/16 0637  WBC 13.0* 12.2*  HGB 10.3* 10.3*  HCT 30.9* 31.1*  PLT 193 200    BMET Recent Labs    12/22/16 0905 12/23/16 0356  NA 136 134*  K 4.4 4.0  CL 103 102  CO2 26 21*  GLUCOSE 115* 111*  BUN 19 19  CREATININE 0.61 0.65  CALCIUM 8.7* 8.8*   PT/INR No results for input(s): LABPROT, INR in the last 72 hours.  No results for  input(s): AST, ALT, ALKPHOS, BILITOT, PROT, ALBUMIN in the last 168 hours.   Lipase  No results found for: LIPASE   Medications: . acetaminophen  1,000 mg Oral Q8H  . docusate sodium  100 mg Oral BID  . Influenza vac split quadrivalent PF  0.5 mL Intramuscular Tomorrow-1000  . methocarbamol  750 mg Oral TID  . PARoxetine  40 mg Oral Daily  . polyethylene glycol  17 g Oral Daily  . traMADol  50 mg Oral Q6H    Assessment/Plan  Fall L rib fxs 3-9 with small hemothorax- check CXR. 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 [place] for [place] with BR privileges today. Hemodynamically stable,  Mod hematoma under R diaphragm ABL anemia- Hemoglobin 10.3 today from 10.7, no hypotension or tachycardia. CBC in AM Chronic pain - on suboxone at home per psychiatrist 8-2 TID at home Anxiety - paxil  ID - none FEN - KVO IVF, regular diet, add daily miralax VTE - SCDs, no chemical DVT prophylaxis due to anemia  Plan: Increase oxycodone, and decrease morphine.  Add ibuprofen        LOS: 4 days    Jahzir Strohmeier 12/25/2016 [phone]

## 2016-12-25 NOTE — Evaluation (Signed)
Occupational Therapy Evaluation Patient Details Name: Jimmy Black MRN: [id number] DOB: [birth year] Today's Date: 12/25/2016    History of Present Illness 56 yo male was walking back from his nephews hockey practice at a park leaning on the rail and did not realize there were steps and the rail ended. He lost his balance and fell down 3-4 stairs landing on his back. Sustained left rib fx 3-9, grade III splenic laceration, moderate hematoma under right diaphragm.    Clinical Impression   PTA, pt was living with his 33 y.o. daughter and was independent. Pt currently performing ADLs at Mod I level with increased time. Provided pt with education on compensatory techniques for dressing/bathing, bed mobility, AE, and LUE exercises. Recommend dc home once medically stable per physcian. Provided all education, answered pt questions, and met all acute OT needs. Will sign off. Thank you.    Follow Up Recommendations  No OT follow up    Equipment Recommendations  None recommended by OT    Recommendations for Other Services       Precautions / Restrictions Precautions Precautions: None Restrictions Weight Bearing Restrictions: No      Mobility Bed Mobility Overal bed mobility: Independent             General bed mobility comments: Educated pt on log roll technique to decrease pain  Transfers Overall transfer level: Independent                    Balance Overall balance assessment: No apparent balance deficits (not formally assessed)                                         ADL either performed or assessed with clinical judgement   ADL Overall ADL's : Modified independent                                       General ADL Comments: Pt performing ADLs and functional mobility at Mod I level with increased time. Provided education on compensatory tehcniques for UB and LB dressing to decrease pain. Educating pt on use of AE for LB  ADLs (mainly reacher and long handled sponge).      Vision         Perception     Praxis      Pertinent Vitals/Pain Pain Assessment: 0-10 Pain Score: 4  Pain Location: left thorax Pain Descriptors / Indicators: Aching;Sore Pain Intervention(s): Monitored during session     Hand Dominance Right   Extremity/Trunk Assessment Upper Extremity Assessment Upper Extremity Assessment: LUE deficits/detail LUE Deficits / Details: Limited ROM due to pain through L side and ribs LUE: Unable to fully assess due to pain LUE Coordination: decreased gross motor   Lower Extremity Assessment Lower Extremity Assessment: Defer to PT evaluation   Cervical / Trunk Assessment Cervical / Trunk Assessment: Normal   Communication Communication Communication: No difficulties   Cognition Arousal/Alertness: Awake/alert Behavior During Therapy: WFL for tasks assessed/performed Overall Cognitive Status: Within Functional Limits for tasks assessed                                     General Comments       Exercises Exercises: General Upper Extremity;Other  exercises General Exercises - Upper Extremity Elbow Flexion: AROM;Left;5 reps;Seated Elbow Extension: AROM;Left;5 reps;Seated Wrist Flexion: AROM;Left;5 reps;Seated Wrist Extension: AROM;Left;5 reps;Seated Digit Composite Flexion: AROM;Left;5 reps;Seated Composite Extension: AROM;Left;5 reps;Seated Other Exercises Other Exercises: Pronation/supination; 5 reps; LUE; seated Other Exercises: Educated pt on wall slides to support shoulder flexion and prevent frozen shoulder   Shoulder Instructions      Home Living Family/patient expects to be discharged to:: Private residence Living Arrangements: Children(9 y.o. daughter) Available Help at Discharge: Family;Available PRN/intermittently Type of Home: House Home Access: Level entry     Home Layout: One level     Bathroom Shower/Tub: Teacher, early years/pre:  Standard     Home Equipment: None          Prior Functioning/Environment Level of Independence: Independent                 OT Problem List: Decreased range of motion;Pain;Impaired UE functional use      OT Treatment/Interventions:      OT Goals(Current goals can be found in the care plan section) Acute Rehab OT Goals Patient Stated Goal: Go home OT Goal Formulation: With patient Time For Goal Achievement: 01/08/17 Potential to Achieve Goals: Good  OT Frequency:     Barriers to D/C:            Co-evaluation              AM-PAC PT "6 Clicks" Daily Activity     Outcome Measure Help from another person eating meals?: None Help from another person taking care of personal grooming?: None Help from another person toileting, which includes using toliet, bedpan, or urinal?: None Help from another person bathing (including washing, rinsing, drying)?: A Little Help from another person to put on and taking off regular upper body clothing?: A Little Help from another person to put on and taking off regular lower body clothing?: None 6 Click Score: 22   End of Session Nurse Communication: Mobility status  Activity Tolerance: Patient tolerated treatment well Patient left: in chair;with call bell/phone within reach  OT Visit Diagnosis: Pain;History of falling (Z91.81) Pain - Right/Left: Left Pain - part of body: Arm(Ribs)                Time: 6010-9323 OT Time Calculation (min): 15 min Charges:  OT General Charges $OT Visit: 1 Visit OT Evaluation $OT Eval Low Complexity: 1 Low G-Codes:     Marqueta Pulley MSOT, OTR/L Acute Rehab Pager: [phone] Office: [place] 12/25/2016, 11:40 AM

## 2016-12-25 NOTE — Discharge Summary (Signed)
Physician Discharge Summary  Patient ID: Jimmy Black MRN: [id number] DOB/AGE: April 09, 1960 56 y.o.  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 He progressed well and on the day of discharge his pain was controlled, he was ambulating and tolerating a diet.   Consults: None  Significant Diagnostic Studies: see epic  Treatments: analgesia  Discharge Exam: Blood pressure (!) 157/95, pulse 81, temperature 98.3 F (36.8 C), resp. rate 18, height 6\' 6"  (1.981 m), weight (!) 137.7 kg (303 lb 9.2 oz), SpO2 96 %. General appearance: alert and cooperative Resp: clear to auscultation bilaterally GI: soft, non-tender; bowel sounds normal; no masses,  no organomegaly Extremities: extremities normal, atraumatic, no cyanosis or edema Skin: Skin color, texture, turgor normal. No rashes or lesions  Disposition: ED Dismiss - Never Arrived  Discharge Instructions    Diet - low sodium heart healthy   Complete by:  As directed    Increase activity slowly   Complete by:  As directed      Allergies as of 12/25/2016   No Known Allergies     Medication List    TAKE these medications   acetaminophen 500 MG tablet Commonly known as:  TYLENOL Take 2 tablets (1,000 mg total) every 8 (eight) hours by mouth.   aspirin EC 81 MG tablet Take 81 mg by mouth daily.   docusate sodium 100 MG capsule Commonly known as:  COLACE Take 1 capsule (100 mg total) 2 (two) times daily by mouth.   FISH OIL CONCENTRATE PO Take 1 capsule by mouth daily.   Garlic 944 MG Tabs Take 1 tablet by mouth daily.    ibuprofen 200 MG tablet Commonly known as:  ADVIL,MOTRIN Take 600 mg by mouth daily as needed for pain.   MENS MULTI VITAMIN & MINERAL PO Take 1 tablet by mouth daily.   methocarbamol 750 MG tablet Commonly known as:  ROBAXIN Take 1 tablet (750 mg total) 3 (three) times daily by mouth.   niacin 250 MG tablet Take 250 mg by mouth daily.   oxyCODONE 5 MG immediate release tablet Commonly known as:  Oxy IR/ROXICODONE Take 1-3 tablets (5-15 mg total) every 4 (four) hours as needed by mouth for moderate pain or severe pain.   PARoxetine 40 MG tablet Commonly known as:  PAXIL Take 40 mg by mouth daily.   SUBOXONE 8-2 MG Film Generic drug:  Buprenorphine HCl-Naloxone HCl Place 1 Film under the tongue 3 (three) times daily. Prescribed by Dr. Christie Nottingham      Follow-up Information    [facility]. Go on 01/03/2017.   Why:  Your appointment is 01/03/2017 at [facility]. Please arrive 30 minutes prior to your appointment to check in and fill out paperwork. Bring photo ID. Contact information: [place] [address] [phone]          Signed: Clovis Riley 12/25/2016, 11:04 AM

## 2016-12-25 NOTE — Progress Notes (Signed)
Patient discharged to home with instructiions and prescriptions.

## 2017-03-28 ENCOUNTER — Ambulatory Visit: Payer: Medicare Other

## 2017-06-07 ENCOUNTER — Ambulatory Visit: Payer: Medicare Other | Admitting: Family Medicine

## 2017-06-07 ENCOUNTER — Encounter: Payer: Self-pay | Admitting: Family Medicine

## 2017-06-07 VITALS — BP 130/90 | HR 94 | Temp 98.0°F | Wt 277.8 lb

## 2017-06-07 DIAGNOSIS — R0781 Pleurodynia: Secondary | ICD-10-CM | POA: Diagnosis not present

## 2017-06-07 DIAGNOSIS — E785 Hyperlipidemia, unspecified: Secondary | ICD-10-CM

## 2017-06-07 MED ORDER — ROSUVASTATIN CALCIUM 20 MG PO TABS: 20.0000 mg | ORAL_TABLET | Freq: Every day | ORAL | 1 refills | Status: DC

## 2017-06-07 NOTE — Progress Notes (Signed)
Subjective:     Patient ID: Jimmy Black, male   DOB: 07/08/1960, 57 y.o.   MRN: [id number]  HPI  Patient here to discuss the following issues  Long history of hyperlipidemia. He would like to go back on statin and specifically requesting Crestor. Father had MI at age 55. Patient has history of low HDL as well as high triglycerides and high cholesterol. Has not had any recent chest pains.  He had very serious fall back in October. He was at a hockey game and basically stepped off when he thought was a walkway and dropped around 6 feet and had laceration of the spleen. He is admitted for several days. He had anterior and posterior left-sided rib fractures 3 through 9th. Overall much improved but has some asymmetry of the left anterior chest wall and rib cage compared to the right. Minimal pain with movement and cough.  Past Medical History:  Diagnosis Date  . Alcohol abuse   . Anxiety    takes Paxil daily  . Chicken pox   . Family history of anesthesia complication    brother got sick after anesthesia  . Hyperlipidemia    takes Fish Oil daily  . Joint pain   . Joint swelling   . Numbness    both leg and feet  . Osteoarthritis    "right knee; both hips" (12/03/2012)  . Peripheral neuropathy    Past Surgical History:  Procedure Laterality Date  . EXCISIONAL TOTAL KNEE ARTHROPLASTY Right 12/03/2012   Procedure: EXCISIONAL TOTAL KNEE ARTHROPLASTY (POLY SWAP), SCAR TISSUE REMOVAL, QUADRICEPSPLASTY;  Surgeon: Vickey Huger, MD;  Location: [facility];  Service: Orthopedics;  Laterality: Right;  . INCISION AND DRAINAGE OF WOUND Right 2006   "w/jet lavage; had to do this twice after the OR" (12/03/2012)  . [place]   "benign tumor removal" (12/03/2012)  . SHOULDER ARTHROSCOPY WITH OPEN ROTATOR CUFF REPAIR Right 2006  . SHOULDER ARTHROSCOPY WITH OPEN ROTATOR CUFF REPAIR Left 06/2012  . TOTAL KNEE ARTHROPLASTY  11/16/2011   Procedure: TOTAL KNEE ARTHROPLASTY;   Surgeon: Kerin Salen, MD;  Location: [facility];  Service: Orthopedics;  Laterality: Right;  right total knee arthroplasty  . TOTAL KNEE REVISION WITH SCAR DEBRIDEMENT/PATELLA REVISION WITH POLY EXCHANGE Right 12/03/2012   "not sure what they did exactly" (10/'13/2014)    reports that he has never smoked. He has quit using smokeless tobacco. His smokeless tobacco use included snuff and chew. He reports that he drinks alcohol. He reports that he does not use drugs. family history includes Alcohol abuse in his paternal aunt and paternal grandfather; Cancer in his father and maternal aunt; Cancer (age of onset: 43) in his mother; Heart disease in his maternal grandfather, mother, and paternal grandfather; Hyperlipidemia in his brother and mother. No Known Allergies   Review of Systems  Constitutional: Negative for fatigue.  Eyes: Negative for visual disturbance.  Respiratory: Negative for cough, chest tightness and shortness of breath.   Cardiovascular: Negative for chest pain, palpitations and leg swelling.  Neurological: Negative for dizziness, syncope, weakness, light-headedness and headaches.       Objective:   Physical Exam  Constitutional: He is oriented to person, place, and time. He appears well-developed and well-nourished.  HENT:  Right Ear: External ear normal.  Left Ear: External ear normal.  Mouth/Throat: Oropharynx is clear and moist.  Eyes: Pupils are equal, round, and reactive to light.  Neck: Neck supple. No thyromegaly present.  Cardiovascular: Normal  rate and regular rhythm.  Pulmonary/Chest: Effort normal and breath sounds normal. No respiratory distress. He has no wheezes. He has no rales.  Slight prominence left anterior rib cage appeared to right. Minimally tender  Musculoskeletal: He exhibits no edema.  Neurological: He is alert and oriented to person, place, and time.       Assessment:     #1 dyslipidemia. Positive family history of CAD  #2 fall several  months ago with multiple rib fractures left anterior/ posterior third through ninth and spleen laceration. He now has some asymmetric chest wall findings. Question calcifiation/healing response from multiple rib fractures    Plan:     -Start Crestor 20 mg once daily and recheck lipid and hepatic in 6-8 weeks -Obtain follow-up x-rays left  Rib  Eulas Post MD [facility] at [facility]

## 2017-06-09 DIAGNOSIS — E785 Hyperlipidemia, unspecified: Secondary | ICD-10-CM | POA: Insufficient documentation

## 2017-06-20 ENCOUNTER — Ambulatory Visit (INDEPENDENT_AMBULATORY_CARE_PROVIDER_SITE_OTHER)
Admission: RE | Admit: 2017-06-20 | Discharge: 2017-06-20 | Disposition: A | Discharge status: Home / Self Care | Payer: Medicare Other | Source: Ambulatory Visit | Attending: Family Medicine | Admitting: Family Medicine

## 2017-06-20 ENCOUNTER — Encounter: Payer: Self-pay | Admitting: Family Medicine

## 2017-06-20 DIAGNOSIS — R0781 Pleurodynia: Secondary | ICD-10-CM | POA: Diagnosis not present

## 2017-07-24 ENCOUNTER — Encounter: Payer: Self-pay | Admitting: Family Medicine

## 2017-08-02 ENCOUNTER — Other Ambulatory Visit: Payer: Medicare Other

## 2017-08-14 ENCOUNTER — Encounter: Payer: Medicare Other | Admitting: Family Medicine

## 2017-12-09 ENCOUNTER — Other Ambulatory Visit: Payer: Self-pay | Admitting: Family Medicine

## 2018-01-14 ENCOUNTER — Other Ambulatory Visit: Payer: Self-pay | Admitting: Family Medicine

## 2018-01-29 ENCOUNTER — Other Ambulatory Visit: Payer: Self-pay | Admitting: Family Medicine

## 2018-01-31 ENCOUNTER — Other Ambulatory Visit: Payer: Self-pay

## 2018-01-31 MED ORDER — ROSUVASTATIN CALCIUM 20 MG PO TABS: ORAL_TABLET | ORAL | 0 refills | Status: DC

## 2018-03-09 ENCOUNTER — Ambulatory Visit: Payer: Self-pay

## 2018-03-09 NOTE — Telephone Encounter (Signed)
Left VM to call office. This encounter was created in error - please disregard.

## 2018-03-09 NOTE — Telephone Encounter (Signed)
Sent MyChart message with request for appt. Due to elevated BP; BP 178/101 at 3:45 PM today.  Phone call to pt.  Reported he randomly checked his BP this afternoon and noted it to be 178/101, heart rate is 54 @ 3:45 PM; rechecked BP 184/111, heart rate 62 @ 4:15 PM.  Denied chest pain, shortness of breath, headache, facial droop, slurred speech, weakness in extremities, balance issues.  Stated he lives with chronic pain in right knee, right shoulder, and left hip.  Reported taking Ibuprofen 200 mg., 4 tablets this AM, and repeated same dose about 2:15 PM.  Advised the Ibuprofen can elevate the BP.    Appt. Scheduled in Saturday clinic tomorrow, at 9:45 AM.  Care advice given per protocol.  Verb. Understanding.     Reason for Disposition . Systolic BP  >= 742 OR Diastolic >= 595  Answer Assessment - Initial Assessment Questions 1. BLOOD PRESSURE: "What is the blood pressure?" "Did you take at least two measurements 5 minutes apart?"     178/101 in left arm, heart rate 54 at 3:45 PM.  184/111 left arm , heart rate 62 @ 4:15 PM   2. ONSET: "When did you take your blood pressure?"     See above 3. HOW: "How did you obtain the blood pressure?" (e.g., visiting nurse, automatic home BP monitor)     Automatic home BP monitor 4. HISTORY: "Do you have a history of high blood pressure?"    no 5. MEDICATIONS: "Are you taking any medications for blood pressure?" "Have you missed any doses recently?"     Not taking any blood pressure medication at present 6. OTHER SYMPTOMS: "Do you have any symptoms?" (e.g., headache, chest pain, blurred vision, difficulty breathing, weakness)     Denied headache, chest pain, blurred vision, shortness of breath, weakness in extremities, facial droop, slurred speech.  C/o chronic pain in right knee, right shoulder, and left hip    7. PREGNANCY: "Is there any chance you are pregnant?" "When was your last menstrual period?"     N/a  Protocols used: HIGH BLOOD  PRESSURE-A-AH

## 2018-03-10 ENCOUNTER — Encounter: Payer: Self-pay | Admitting: Family Medicine

## 2018-03-10 ENCOUNTER — Ambulatory Visit: Payer: Medicare Other | Admitting: Family Medicine

## 2018-03-16 ENCOUNTER — Ambulatory Visit: Payer: Medicare Other | Admitting: Family Medicine

## 2018-03-20 ENCOUNTER — Ambulatory Visit: Payer: Medicare Other | Admitting: Family Medicine

## 2018-03-20 ENCOUNTER — Other Ambulatory Visit: Payer: Self-pay

## 2018-03-20 ENCOUNTER — Encounter: Payer: Self-pay | Admitting: Family Medicine

## 2018-03-20 VITALS — BP 156/98 | HR 62 | Temp 98.4°F | Ht 78.0 in | Wt 283.3 lb

## 2018-03-20 DIAGNOSIS — Z23 Encounter for immunization: Secondary | ICD-10-CM

## 2018-03-20 DIAGNOSIS — Z8042 Family history of malignant neoplasm of prostate: Secondary | ICD-10-CM | POA: Diagnosis not present

## 2018-03-20 DIAGNOSIS — E785 Hyperlipidemia, unspecified: Secondary | ICD-10-CM

## 2018-03-20 DIAGNOSIS — I10 Essential (primary) hypertension: Secondary | ICD-10-CM

## 2018-03-20 HISTORY — DX: Essential (primary) hypertension: I10

## 2018-03-20 LAB — COMPREHENSIVE METABOLIC PANEL
ALBUMIN: 4.9 g/dL (ref 3.5–5.2)
ALT: 22 U/L (ref 0–53)
AST: 22 U/L (ref 0–37)
Alkaline Phosphatase: 67 U/L (ref 39–117)
BUN: 23 mg/dL (ref 6–23)
CALCIUM: 10.3 mg/dL (ref 8.4–10.5)
CO2: 29 meq/L (ref 19–32)
Chloride: 101 mEq/L (ref 96–112)
Creatinine, Ser: 0.8 mg/dL (ref 0.40–1.50)
GFR: 100.2 mL/min (ref >60.00)
GLUCOSE: 83 mg/dL (ref 70–99)
Potassium: 4.8 mEq/L (ref 3.5–5.1)
Sodium: 138 mEq/L (ref 135–145)
TOTAL PROTEIN: 7.2 g/dL (ref 6.0–8.3)
Total Bilirubin: 0.8 mg/dL (ref 0.2–1.2)

## 2018-03-20 LAB — LIPID PANEL
CHOL/HDL RATIO: 6
Cholesterol: 201 mg/dL — ABNORMAL HIGH (ref 0–200)
HDL: 34.8 mg/dL — AB (ref >39.00)
NONHDL: 165.9
Triglycerides: 242 mg/dL — ABNORMAL HIGH (ref 0.0–149.0)
VLDL: 48.4 mg/dL — AB (ref 0.0–40.0)

## 2018-03-20 LAB — PSA: PSA: 0.54 ng/mL (ref 0.10–4.00)

## 2018-03-20 LAB — LDL CHOLESTEROL, DIRECT: LDL DIRECT: 110 mg/dL

## 2018-03-20 MED ORDER — LOSARTAN POTASSIUM 50 MG PO TABS: 50.0000 mg | ORAL_TABLET | Freq: Every day | ORAL | 3 refills | Status: DC

## 2018-03-20 NOTE — Patient Instructions (Signed)

## 2018-03-20 NOTE — Progress Notes (Signed)
Subjective:     Patient ID: Jimmy Black, male   DOB: Aug 22, 1960, 58 y.o.   MRN: [id number]  HPI Patient seen for elevated blood pressure.  He had several home readings recently with 749S systolic and over 496 diastolic.  No headaches.  No dizziness.  No dyspnea.  He is still in some chronic orthopedic pain and sees orthopedics for that.  He has had some weight gain in the past year which he realizes is related to poor diet and decreased exercise.  He just recently tightened up his diet.  Also taking lots of ibuprofen.  No alcohol.  Current medications include Suboxone, Paxil, Crestor.  He is overdue for follow-up labs.  Still needs flu vaccine  Family history significant for father with prostate cancer.  He is requesting follow-up PSA  Past Medical History:  Diagnosis Date  . Alcohol abuse   . Anxiety    takes Paxil daily  . Chicken pox   . Family history of anesthesia complication    brother got sick after anesthesia  . Hyperlipidemia    takes Fish Oil daily  . Hypertension 03/20/2018  . Joint pain   . Joint swelling   . Numbness    both leg and feet  . Osteoarthritis    "right knee; both hips" (12/03/2012)  . Peripheral neuropathy    Past Surgical History:  Procedure Laterality Date  . EXCISIONAL TOTAL KNEE ARTHROPLASTY Right 12/03/2012   Procedure: EXCISIONAL TOTAL KNEE ARTHROPLASTY (POLY SWAP), SCAR TISSUE REMOVAL, QUADRICEPSPLASTY;  Surgeon: Vickey Huger, MD;  Location: [facility];  Service: Orthopedics;  Laterality: Right;  . INCISION AND DRAINAGE OF WOUND Right 2006   "w/jet lavage; had to do this twice after the OR" (12/03/2012)  . [place]   "benign tumor removal" (12/03/2012)  . SHOULDER ARTHROSCOPY WITH OPEN ROTATOR CUFF REPAIR Right 2006  . SHOULDER ARTHROSCOPY WITH OPEN ROTATOR CUFF REPAIR Left 06/2012  . TOTAL KNEE ARTHROPLASTY  11/16/2011   Procedure: TOTAL KNEE ARTHROPLASTY;  Surgeon: Kerin Salen, MD;  Location: [facility];   Service: Orthopedics;  Laterality: Right;  right total knee arthroplasty  . TOTAL KNEE REVISION WITH SCAR DEBRIDEMENT/PATELLA REVISION WITH POLY EXCHANGE Right 12/03/2012   "not sure what they did exactly" (10/'13/2014)    reports that he has never smoked. His smokeless tobacco use includes snuff and chew. He reports current alcohol use. He reports that he does not use drugs. family history includes Alcohol abuse in his paternal aunt and paternal grandfather; Cancer in his father and maternal aunt; Cancer (age of onset: 32) in his mother; Heart disease in his maternal grandfather, mother, and paternal grandfather; Hyperlipidemia in his brother and mother. No Known Allergies    Review of Systems  Constitutional: Negative for fatigue and unexpected weight change.  Eyes: Negative for visual disturbance.  Respiratory: Negative for cough, chest tightness and shortness of breath.   Cardiovascular: Negative for chest pain, palpitations and leg swelling.  Endocrine: Negative for polydipsia and polyuria.  Neurological: Negative for dizziness, syncope, weakness, light-headedness and headaches.       Objective:   Physical Exam Constitutional:      Appearance: He is well-developed.  HENT:     Right Ear: External ear normal.     Left Ear: External ear normal.  Eyes:     Pupils: Pupils are equal, round, and reactive to light.  Neck:     Musculoskeletal: Neck supple.     Thyroid: No thyromegaly.  Cardiovascular:     Rate and Rhythm: Normal rate and regular rhythm.  Pulmonary:     Effort: Pulmonary effort is normal. No respiratory distress.     Breath sounds: Normal breath sounds. No wheezing or rales.  Neurological:     Mental Status: He is alert and oriented to person, place, and time.        Assessment:     #1 hypertension.  This is a new diagnosis.  He has elevated reading here today and repeat left arm seated after rest 152/98.  Also has had several elevated home readings as above.   Recent weight gain probably contributing  #2 hyperlipidemia  #3 obesity  #4+ family history of prostate cancer    Plan:     -Set follow-up labs with lipid panel, comprehensive metabolic panel, PSA -Refill Crestor for 1 year -Start losartan 50 mg once daily.  He will schedule physical in the next 3 to 4 weeks and follow-up then -We discussed importance of better weight management in treating his diabetes -Flu vaccine was given  Eulas Post MD [facility] at [facility]

## 2018-04-10 ENCOUNTER — Other Ambulatory Visit: Payer: Self-pay | Admitting: Family Medicine

## 2018-04-17 ENCOUNTER — Encounter: Payer: Self-pay | Admitting: Family Medicine

## 2018-04-17 ENCOUNTER — Other Ambulatory Visit: Payer: Self-pay

## 2018-04-17 DIAGNOSIS — Z0000 Encounter for general adult medical examination without abnormal findings: Secondary | ICD-10-CM

## 2018-04-17 MED ORDER — PAROXETINE HCL 40 MG PO TABS: 40.0000 mg | ORAL_TABLET | Freq: Every day | ORAL | 2 refills | Status: DC

## 2018-05-13 ENCOUNTER — Other Ambulatory Visit: Payer: Self-pay | Admitting: Family Medicine

## 2018-05-13 DIAGNOSIS — Z0000 Encounter for general adult medical examination without abnormal findings: Secondary | ICD-10-CM

## 2018-05-21 ENCOUNTER — Encounter: Payer: Self-pay | Admitting: Family Medicine

## 2018-05-21 ENCOUNTER — Other Ambulatory Visit: Payer: Self-pay

## 2018-05-21 ENCOUNTER — Telehealth: Payer: Self-pay | Admitting: Family Medicine

## 2018-05-21 ENCOUNTER — Ambulatory Visit (INDEPENDENT_AMBULATORY_CARE_PROVIDER_SITE_OTHER): Payer: Medicare Other | Admitting: Family Medicine

## 2018-05-21 DIAGNOSIS — G894 Chronic pain syndrome: Secondary | ICD-10-CM

## 2018-05-21 MED ORDER — HYDROCODONE-IBUPROFEN 7.5-200 MG PO TABS: 1.0000 | ORAL_TABLET | Freq: Three times a day (TID) | ORAL | 0 refills | Status: DC | PRN

## 2018-05-21 NOTE — Progress Notes (Signed)
Patient ID: Jimmy Black, male   DOB: 14-Dec-1960, 58 y.o.   MRN: [id number]  Virtual Visit via Video Note  I connected with Jimmy Black on 05/21/18 at  1:45 PM EDT by a video enabled telemedicine application and verified that I am speaking with the correct person using two identifiers.  Location patient: home Location provider:work or home office Persons participating in the virtual visit: patient, provider  I discussed the limitations of evaluation and management by telemedicine and the availability of in person appointments. The patient expressed understanding and agreed to proceed.   HPI: Patient has chronic problems including hypertension, obstructive sleep apnea, hyperlipidemia, chronic pain related to multiple prior orthopedic injuries.  Patient relates a difficult situation.  He has been he states for at least 10 years on Suboxone 8-2 mg film 1 every 8 hours.  His chronic pain management physician apparently died of sudden death recently and he is currently in process of setting up with another pain management clinic.  He will be seeing integrative pain solutions in [place] but they have been unable to give him a date at this point.  We have not provided any pain medication for him previously but these are extenuating circumstances given the current transition to different pain management clinic and also the fact that we are under going current Covid-19 pandemic.  He had plan to leave to go to [place] to be with family for the next month.  He is very concerned about withdrawal symptoms if he does not have something and he plans to try to scale back use.  He plans to come back as soon as he hears from his new integrative pain management clinic as far as transitioning care.  He has predominantly severe knee pain related to multiple prior surgeries He has done very well with Suboxone in the past but we are unable to complete that prescription because of the narcotic  treatment program requirement of mandated 8-hour training and DEA waiver to prescribe buprenorphine for opioid replacement therapy     ROS: See pertinent positives and negatives per HPI.  Past Medical History:  Diagnosis Date  . Alcohol abuse   . Anxiety    takes Paxil daily  . Chicken pox   . Family history of anesthesia complication    brother got sick after anesthesia  . Hyperlipidemia    takes Fish Oil daily  . Hypertension 03/20/2018  . Joint pain   . Joint swelling   . Numbness    both leg and feet  . Osteoarthritis    "right knee; both hips" (12/03/2012)  . Peripheral neuropathy     Past Surgical History:  Procedure Laterality Date  . EXCISIONAL TOTAL KNEE ARTHROPLASTY Right 12/03/2012   Procedure: EXCISIONAL TOTAL KNEE ARTHROPLASTY (POLY SWAP), SCAR TISSUE REMOVAL, QUADRICEPSPLASTY;  Surgeon: Vickey Huger, MD;  Location: [facility];  Service: Orthopedics;  Laterality: Right;  . INCISION AND DRAINAGE OF WOUND Right 2006   "w/jet lavage; had to do this twice after the OR" (12/03/2012)  . [place]   "benign tumor removal" (12/03/2012)  . SHOULDER ARTHROSCOPY WITH OPEN ROTATOR CUFF REPAIR Right 2006  . SHOULDER ARTHROSCOPY WITH OPEN ROTATOR CUFF REPAIR Left 06/2012  . TOTAL KNEE ARTHROPLASTY  11/16/2011   Procedure: TOTAL KNEE ARTHROPLASTY;  Surgeon: Kerin Salen, MD;  Location: [facility];  Service: Orthopedics;  Laterality: Right;  right total knee arthroplasty  . TOTAL KNEE REVISION WITH SCAR DEBRIDEMENT/PATELLA REVISION WITH POLY EXCHANGE  Right 12/03/2012   "not sure what they did exactly" (10/'13/2014)    Family History  Problem Relation Age of Onset  . Cancer Mother 28       breast  . Hyperlipidemia Mother   . Heart disease Mother   . Cancer Father        prostate  . Hyperlipidemia Brother   . Cancer Maternal Aunt        breast  . Alcohol abuse Paternal Aunt   . Heart disease Maternal Grandfather   . Alcohol abuse Paternal Grandfather    . Heart disease Paternal Grandfather     SOCIAL HX: non-smoker   No ETOH use.   Current Outpatient Medications:  .  aspirin EC 81 MG tablet, Take 81 mg by mouth daily., Disp: , Rfl:  .  Garlic 654 MG TABS, Take 1 tablet by mouth daily. , Disp: , Rfl:  .  HYDROcodone-ibuprofen (VICOPROFEN) 7.5-200 MG tablet, Take 1 tablet by mouth every 8 (eight) hours as needed for moderate pain., Disp: 90 tablet, Rfl: 0 .  ibuprofen (ADVIL,MOTRIN) 200 MG tablet, Take 600 mg by mouth daily as needed for pain., Disp: , Rfl:  .  losartan (COZAAR) 50 MG tablet, Take 1 tablet (50 mg total) by mouth daily., Disp: 90 tablet, Rfl: 3 .  Multiple Vitamins-Minerals (MENS MULTI VITAMIN & MINERAL PO), Take 1 tablet by mouth daily. , Disp: , Rfl:  .  niacin 250 MG tablet, Take 250 mg by mouth daily., Disp: , Rfl:  .  Omega-3 Fatty Acids (FISH OIL CONCENTRATE PO), Take 1 capsule by mouth daily. , Disp: , Rfl:  .  PARoxetine (PAXIL) 40 MG tablet, TAKE 1 TABLET BY MOUTH EVERY DAY, Disp: 90 tablet, Rfl: 1 .  rosuvastatin (CRESTOR) 20 MG tablet, TAKE 1 TABLET BY MOUTH EVERY DAY - CALL FOR LAB APPOINTMENT, Disp: 90 tablet, Rfl: 3  EXAM:  VITALS per patient if applicable:  GENERAL: alert, oriented, appears well and in no acute distress  HEENT: atraumatic, conjunttiva clear, no obvious abnormalities on inspection of external nose and ears  NECK: normal movements of the head and neck  LUNGS: on inspection no signs of respiratory distress, breathing rate appears normal, no obvious gross SOB, gasping or wheezing  CV: no obvious cyanosis  MS: moves all visible extremities without noticeable abnormality  PSYCH/NEURO: pleasant and cooperative, no obvious depression or anxiety, speech and thought processing grossly intact  ASSESSMENT AND PLAN:  Discussed the following assessment and plan:  Chronic pain syndrome.  Very unique circumstance in that his longstanding chronic pain management physician died suddenly and  unexpectedly.  He is in process of transition to another program.  We cannot prescribe his Suboxone therapy.  We did agree to limited Vicoprofen 7.5 mg / 200 mg 1 every 8 hours until he can get to his new chronic pain management therapy #90 with no refills.     I discussed the assessment and treatment plan with the patient. The patient was provided an opportunity to ask questions and all were answered. The patient agreed with the plan and demonstrated an understanding of the instructions.   The patient was advised to call back or seek an in-person evaluation if the symptoms worsen or if the condition fails to improve as anticipated.  I provided 20 minutes of non-face-to-face time during this encounter.   Carolann Littler, MD

## 2018-05-21 NOTE — Telephone Encounter (Signed)
Copied from [place] [id number]. Topic: Quick Communication - Rx Refill/Question >> May 21, 2018  3:08 PM Sheran Luz wrote: Medication: HYDROcodone-ibuprofen (VICOPROFEN) 7.5-200 MG tablet [  Pharmacy on file did not have this medication in stock and was advised to contact office to transfer RX. Patient is requesting it be resent to new pharmacy (below). Patient is going out of town today and requests this be handled as soon as possible. Please advise.   Preferred Pharmacy (with phone number or street name):[id number] - [place], [address] [phone] (Phone) [phone] (Fax)

## 2018-05-21 NOTE — Telephone Encounter (Signed)
Copied from [place] [id number]. Topic: Quick Communication - Rx Refill/Question >> May 21, 2018  4:42 PM Blase Mess A wrote: Medication: HYDROcodone-ibuprofen (VICOPROFEN) 7.5-200 MG tablet [[id number]] The previous Pharmacy did not have the medication.  Has the patient contacted their pharmacy? Yes  (Agent: If no, request that the patient contact the pharmacy for the refill.) (Agent: If yes, when and what did the pharmacy advise?)  Preferred Pharmacy (with phone number or street name): [address] [place], [place] [address] [phone]  Agent: Please be advised that RX refills may take up to 3 business days. We ask that you follow-up with your pharmacy.

## 2018-05-21 NOTE — Telephone Encounter (Signed)
Please see message. °

## 2018-05-22 ENCOUNTER — Telehealth: Payer: Self-pay

## 2018-05-22 MED ORDER — HYDROCODONE-IBUPROFEN 7.5-200 MG PO TABS: 1.0000 | ORAL_TABLET | Freq: Three times a day (TID) | ORAL | 0 refills | Status: DC | PRN

## 2018-05-22 NOTE — Telephone Encounter (Signed)
This message is being answered in a different telephone message.

## 2018-05-22 NOTE — Telephone Encounter (Signed)
Patient calling and states that he has been trying to get to [facility] since yesterday. States that he has a flight scheduled at 6:30 and would like to get this approved asap.

## 2018-05-22 NOTE — Telephone Encounter (Signed)
Resent to new pharmacy

## 2018-05-22 NOTE — Telephone Encounter (Signed)
Called patient and LMOVM to return call  [place] for [facility] to Discuss results / PCP / recommendations / Schedule patient  Left a message to see if patient has already left town. If not please let us know and we can send to a different pharmacy as he requested.  CRM Created.

## 2018-05-22 NOTE — Telephone Encounter (Signed)
Copied from [place] [id number]. Topic: General - Other >> May 22, 2018 11:58 AM Carolyn Stare wrote:  Der with the pharmacy call to say pt is taking Suboxone and taking HYDROcodone-ibuprofen (VICOPROFEN) 7.5-200 MG tablet will make it in effective will need a call back before they will fill the RX

## 2018-05-22 NOTE — Telephone Encounter (Signed)
Called patient and he stated that he needed to have someone call the pharmacy to give them the OK to fill this prescription. I let him know that this had been sent in by Dr. Elease Hashimoto.  Called pharmacy and spoke to [place] and she stated that this prescription has not been filled here before and he is currently on another medication that he is not able to take together. Patient is aware and not taking together, he will be in [place] and needs this medication as a bridge to get him to his new appointment with his pain management doctor since his current doctor passed. Medication needed a PA but patient stated he would pay out of pocket. [facility] is filling medication for patient.  Patient is aware and verbalized an understanding and thanked [place].

## 2018-05-22 NOTE — Telephone Encounter (Signed)
Pt called- the pharm is out of this medication and pt needs it filled as soon as possible. [facility] [address] [facility] [place].  Pt is leaving town at 630 and needs this completed before then.

## 2018-05-22 NOTE — Telephone Encounter (Signed)
Since [facility], can't the rx be transferred from one store to the other?  Also, would make sure he has confirmed other store has in stock before we re-send to other store.

## 2018-05-22 NOTE — Telephone Encounter (Signed)
Pt returned call. Pt says that he postponed until he receive his medication. Pt would like to have [facility] to  [id number] [place], [place] - [address] [phone] (Phone) [phone] (Fax)   As soon as possible.    Thanks.

## 2018-05-22 NOTE — Telephone Encounter (Signed)
Please resend medication to pharmacy listed per patient. Not able to transfer from pharmacy since controlled.

## 2018-05-22 NOTE — Telephone Encounter (Addendum)
Relation to pt: self  Call back number [phone] Pharmacy: [facility] [address] [place], [place] [address] [phone]    Reason for call:   Patient states he finally found a pharmacy that has HYDROcodone-ibuprofen (VICOPROFEN) 7.5-200 MG tablet in stock. Patient states he will cancel his flight tonight and [facility] for tomorrow. Please send rX to  [facility], [place], [place] [address] [phone]

## 2018-05-23 ENCOUNTER — Telehealth: Payer: Self-pay | Admitting: Family Medicine

## 2018-05-23 MED ORDER — HYDROCODONE-IBUPROFEN 7.5-200 MG PO TABS: 1.0000 | ORAL_TABLET | Freq: Three times a day (TID) | ORAL | 0 refills | Status: DC | PRN

## 2018-05-23 NOTE — Addendum Note (Signed)
Addended by: Eulas Post on: 05/23/2018 11:59 AM   Modules accepted: Orders

## 2018-05-23 NOTE — Telephone Encounter (Signed)
I called [facility] [address] in [place] and spoke to [place] and explained the situation. Alex assured me that he had this medication in stock and when he received the Rx from Dr. Elease Hashimoto he would fill for patient.  I called patient and let him know that this medication has just been sent and patient verbalized an understanding.

## 2018-05-23 NOTE — Telephone Encounter (Signed)
[facility] pharmacy left vm requesting someone to call them back because they got the refill for pt's hydrocodone but it looks like they are also seeing a rx for "suboxone". Phone was breaking up in message. If someone could reach out to them for clarification.

## 2018-05-23 NOTE — Telephone Encounter (Signed)
Please send this prescription to the [facility] at [place] In [place]. This is the pharmacy that has this medication.  The other pharmacy thought they had in stock and ended up still not having this prescription.

## 2018-05-23 NOTE — Telephone Encounter (Signed)
Called this pharmacy this morning and spoke to [facility] and she stated that they still do not have this medication in stock and this prescription needs to be sent to the other pharmacy. Sent another message to Dr. Elease Hashimoto.

## 2018-11-01 ENCOUNTER — Ambulatory Visit: Payer: Medicare Other | Admitting: Cardiology

## 2018-11-12 ENCOUNTER — Ambulatory Visit: Payer: Medicare Other | Admitting: Family Medicine

## 2018-11-16 DIAGNOSIS — I4891 Unspecified atrial fibrillation: Secondary | ICD-10-CM

## 2018-11-16 DIAGNOSIS — R002 Palpitations: Secondary | ICD-10-CM

## 2018-11-16 HISTORY — DX: Unspecified atrial fibrillation: I48.91

## 2018-11-16 HISTORY — DX: Palpitations: R00.2

## 2018-11-19 NOTE — Progress Notes (Signed)
Cardiology Office Note:    Date:  11/20/2018   ID:  Jimmy Black, DOB 1960/10/06, MRN [id number]  PCP:  Eulas Post, MD  Cardiologist:  Shirlee More, MD   Referring MD: Eulas Post, MD  ASSESSMENT:    1. Paroxysmal atrial fibrillation (HCC)   2. Essential hypertension   3. Mixed hyperlipidemia   4. Obstructive sleep apnea    PLAN:    In order of problems listed above:  1. He has had a single brief episode of atrial fibrillation documented approximately 10 minutes in the day prefers not to be on a long acting beta-blocker arm and was short acting metoprolol take PRN and if he has significant recurrence we can initiate an antiarrhythmic drug.  I encouraged him to discuss with his PCP CPAP treatment will check his thyroid echocardiogram plan to see him in 6 months or sooner.  I encouraged him to buy the iPhone adapter. 2. BP is not at target switch to a more potent ARB monitor at home 3. Continue statin 4. With his PCP initiation of CPAP treatment  Next appointment 6 months   Medication Adjustments/Labs and Tests Ordered: Current medicines are reviewed at length with the patient today.  Concerns regarding medicines are outlined above.  No orders of the defined types were placed in this encounter.  No orders of the defined types were placed in this encounter.    Chief Complaint  Patient presents with  . Follow-up  . Atrial Fibrillation    History of Present Illness:    Jimmy Black is a 58 y.o. male who is being seen today for the evaluation of atrial fibrillation at the request of Eulas Post, MD.  Prior to the visit of reviewed [facility] ED record 10/26/2018.  He was seen for palpitation captured initially in atrial fibrillation rate 121 bpm and within 13 minutes spontaneously converted to sinus rhythm.  He had laboratory studies including normal troponin proBNP CBC BMP there is a notation d-dimer is elevated but no report  in the emergency room record underwent a cardiac CTA which was normal was discharged from the hospital for outpatient cardiology follow-up.  Of note is the fact that his initial blood pressure in the emergency room was 202/112 follow blood pressure prior to discharge 155/104.  His discharge medications included Toprol-XL 25 mg daily new and he was advised to continue his other medications including losartan rosuvastatin.  He had no calcification of the coronary arteries and cardiovascular structures were normal on the CTA.  In June 2017 he had a home sleep study showing moderate obstructive sleep apnea and advised CPAP therapy.  TSH was normal in 2016  He has had episodes in the past of brief rapid heart rhythm but never severe sustained.  He has untreated sleep apnea have asked this to discuss with his primary care physician no known history of heart disease and no family history of atrial fibrillation his stroke risk is low with a chads 2 score of 1 and a single brief episode of atrial fibrillation I would not initiate anticoagulation.  Continue to take low-dose aspirin.  His home blood pressures are running close to [id number] systolic and will switch to a more potent ARB.  He will have follow-up TSH and echocardiogram performed.  After discussion of options antiarrhythmic drug initiation referral to EP will take a careful cautious approach use beta-blocker as needed if he has a recurrent episode arm him with the iPhone adapter to record  EKG check echocardiogram plan to see in the office in 6 months or sooner if he is having breakthrough events.  No exercise intolerance chest pain edema orthopnea and has not had syncope.  Is no known heart disease  Past Medical History:  Diagnosis Date  . Alcohol abuse   . Anxiety    takes Paxil daily  . Atrial fibrillation ([place]) 11/16/2018  . Chicken pox   . Family history of anesthesia complication    brother got sick after anesthesia  . Hyperlipidemia    takes Fish Oil  daily  . Hypertension 03/20/2018  . Joint pain   . Joint swelling   . Numbness    both leg and feet  . Obstructive sleep apnea 06/21/2015  . Osteoarthritis    "right knee; both hips" (12/03/2012)  . Palpitations 11/16/2018  . Peripheral neuropathy     Past Surgical History:  Procedure Laterality Date  . EXCISIONAL TOTAL KNEE ARTHROPLASTY Right 12/03/2012   Procedure: EXCISIONAL TOTAL KNEE ARTHROPLASTY (POLY SWAP), SCAR TISSUE REMOVAL, QUADRICEPSPLASTY;  Surgeon: Vickey Huger, MD;  Location: [facility];  Service: Orthopedics;  Laterality: Right;  . INCISION AND DRAINAGE OF WOUND Right 2006   "w/jet lavage; had to do this twice after the OR" (12/03/2012)  . [place]   "benign tumor removal" (12/03/2012)  . SHOULDER ARTHROSCOPY WITH OPEN ROTATOR CUFF REPAIR Right 2006  . SHOULDER ARTHROSCOPY WITH OPEN ROTATOR CUFF REPAIR Left 06/2012  . TOTAL KNEE ARTHROPLASTY  11/16/2011   Procedure: TOTAL KNEE ARTHROPLASTY;  Surgeon: Kerin Salen, MD;  Location: [facility];  Service: Orthopedics;  Laterality: Right;  right total knee arthroplasty  . TOTAL KNEE REVISION WITH SCAR DEBRIDEMENT/PATELLA REVISION WITH POLY EXCHANGE Right 12/03/2012   "not sure what they did exactly" (10/'13/2014)    Current Medications: Current Meds  Medication Sig  . aspirin EC 81 MG tablet Take 81 mg by mouth daily.  . Buprenorphine HCl-Naloxone HCl 8-2 MG FILM 1 Film every 8 (eight) hours.  . Garlic 123XX123 MG TABS Take 1 tablet by mouth 3 (three) times daily.   Marland Kitchen ibuprofen (ADVIL,MOTRIN) 200 MG tablet Take 600 mg by mouth daily as needed for pain.  Marland Kitchen losartan (COZAAR) 50 MG tablet Take 1 tablet (50 mg total) by mouth daily.  . Multiple Vitamins-Minerals (MENS MULTI VITAMIN & MINERAL PO) Take 1 tablet by mouth daily.   . niacin 250 MG tablet Take 250 mg by mouth daily.  . Omega-3 Fatty Acids (FISH OIL CONCENTRATE PO) Take 1 capsule by mouth 3 (three) times daily.   Marland Kitchen PARoxetine (PAXIL) 40 MG tablet TAKE 1  TABLET BY MOUTH EVERY DAY  . rosuvastatin (CRESTOR) 20 MG tablet TAKE 1 TABLET BY MOUTH EVERY DAY - CALL FOR LAB APPOINTMENT     Allergies:   Patient has no known allergies.   Social History   Socioeconomic History  . Marital status: Divorced    Spouse name: Not on file  . Number of children: Not on file  . Years of education: Not on file  . Highest education level: Not on file  Occupational History  . Not on file  Social Needs  . Financial resource strain: Not on file  . Food insecurity    Worry: Not on file    Inability: Not on file  . Transportation needs    Medical: Not on file    Non-medical: Not on file  Tobacco Use  . Smoking status: Never Smoker  . Smokeless  tobacco: Current User    Types: Snuff, Chew  Substance and Sexual Activity  . Alcohol use: Not Currently    Comment: 12/03/2012 "quit all alcohol in 1998; family hx of problems w/it"  . Drug use: No  . Sexual activity: Not Currently  Lifestyle  . Physical activity    Days per week: Not on file    Minutes per session: Not on file  . Stress: Not on file  Relationships  . Social Herbalist on phone: Not on file    Gets together: Not on file    Attends religious service: Not on file    Active member of club or organization: Not on file    Attends meetings of clubs or organizations: Not on file    Relationship status: Not on file  Other Topics Concern  . Not on file  Social History Narrative  . Not on file     Family History: The patient's family history includes Alcohol abuse in his paternal aunt and paternal grandfather; Cancer in his father and maternal aunt; Cancer (age of onset: 84) in his mother; Heart disease in his maternal grandfather, mother, and paternal grandfather; Hyperlipidemia in his brother and mother.  ROS:   Review of Systems  Constitution: Negative.  HENT: Negative.   Eyes: Negative.   Cardiovascular: Positive for palpitations.  Respiratory: Negative.   Endocrine:  Negative.   Hematologic/Lymphatic: Negative.   Skin: Negative.   Musculoskeletal: Positive for joint pain.  Gastrointestinal: Negative.   Genitourinary: Negative.   Neurological: Positive for paresthesias.  Psychiatric/Behavioral: Negative.   Allergic/Immunologic: Negative.    Please see the history of present illness.     All other systems reviewed and are negative.  EKGs/Labs/Other Studies Reviewed:    The following studies were reviewed today:   EKG:  EKG is  ordered today.  The ekg ordered today is personally reviewed and demonstrates this rhythm normal EKG  Recent Labs: 03/20/2018: ALT 22; BUN 23; Creatinine, Ser 0.80; Potassium 4.8; Sodium 138  Recent Lipid Panel    Component Value Date/Time   CHOL 201 (H) 03/20/2018 1206   TRIG 242.0 (H) 03/20/2018 1206   HDL 34.80 (L) 03/20/2018 1206   CHOLHDL 6 03/20/2018 1206   VLDL 48.4 (H) 03/20/2018 1206   LDLDIRECT 110.0 03/20/2018 1206    Physical Exam:    VS:  BP (!) 144/92 (BP Location: Left Arm, Patient Position: Sitting, Cuff Size: Large)   Pulse (!) 57   Temp (!) 95 F (35 C)   Ht 6\' 6"  (1.981 m)   Wt 272 lb 6.4 oz (123.6 kg)   SpO2 97%   BMI 31.48 kg/m     Wt Readings from Last 3 Encounters:  11/20/18 272 lb 6.4 oz (123.6 kg)  03/20/18 283 lb 4.8 oz (128.5 kg)  06/07/17 277 lb 12.8 oz (126 kg)     GEN:  Well nourished, well developed in no acute distress HEENT: Normal NECK: No JVD; No carotid bruits LYMPHATICS: No lymphadenopathy CARDIAC: RRR, no murmurs, rubs, gallops RESPIRATORY:  Clear to auscultation without rales, wheezing or rhonchi  ABDOMEN: Soft, non-tender, non-distended MUSCULOSKELETAL:  No edema; No deformity  SKIN: Warm and dry NEUROLOGIC:  Alert and oriented x 3 PSYCHIATRIC:  Normal affect     Signed, Shirlee More, MD  11/20/2018 2:17 PM    [facility]

## 2018-11-20 ENCOUNTER — Other Ambulatory Visit: Payer: Self-pay

## 2018-11-20 ENCOUNTER — Encounter: Payer: Self-pay | Admitting: Cardiology

## 2018-11-20 ENCOUNTER — Ambulatory Visit (INDEPENDENT_AMBULATORY_CARE_PROVIDER_SITE_OTHER): Payer: Medicare Other | Admitting: Cardiology

## 2018-11-20 VITALS — BP 144/92 | HR 57 | Temp 95.0°F | Ht 78.0 in | Wt 272.4 lb

## 2018-11-20 DIAGNOSIS — I10 Essential (primary) hypertension: Secondary | ICD-10-CM | POA: Diagnosis not present

## 2018-11-20 DIAGNOSIS — E782 Mixed hyperlipidemia: Secondary | ICD-10-CM

## 2018-11-20 DIAGNOSIS — R002 Palpitations: Secondary | ICD-10-CM

## 2018-11-20 DIAGNOSIS — I480 Paroxysmal atrial fibrillation: Secondary | ICD-10-CM

## 2018-11-20 DIAGNOSIS — G4733 Obstructive sleep apnea (adult) (pediatric): Secondary | ICD-10-CM | POA: Diagnosis not present

## 2018-11-20 MED ORDER — TELMISARTAN 20 MG PO TABS: 20.0000 mg | ORAL_TABLET | Freq: Every day | ORAL | 3 refills | Status: DC

## 2018-11-20 MED ORDER — METOPROLOL TARTRATE 25 MG PO TABS: ORAL_TABLET | ORAL | 3 refills | Status: DC

## 2018-11-20 NOTE — Patient Instructions (Addendum)
Medication Instructions:  Your physician has recommended you make the following change in your medication:   STOP losartan STOP metoprolol succinate (toprol)   START telmisartan (micardis) 20 mg: Take 1 tablet daily START metoprolol tartrate (lopressor) 25 mg: Take 1 tablet every 4 hours as needed if your heart rate is greater than 110.  If you need a refill on your cardiac medications before your next appointment, please call your pharmacy.   Lab work: Your physician recommends that you return for lab work today: TSH.   If you have labs (blood work) drawn today and your tests are completely normal, you will receive your results only by: Marland Kitchen MyChart Message (if you have MyChart) OR . A paper copy in the mail If you have any lab test that is abnormal or we need to change your treatment, we will call you to review the results.  Testing/Procedures: You had an EKG today.   Your physician has requested that you have an echocardiogram. Echocardiography is a painless test that uses sound waves to create images of your heart. It provides your doctor with information about the size and shape of your heart and how well your heart's chambers and valves are working. This procedure takes approximately one hour. There are no restrictions for this procedure.  Follow-Up: At [facility], you and your health needs are our priority.  As part of our continuing mission to provide you with exceptional heart care, we have created designated Provider Care Teams.  These Care Teams include your primary Cardiologist (physician) and Advanced Practice Providers (APPs -  Physician Assistants and Nurse Practitioners) who all work together to provide you with the care you need, when you need it. You will need a follow up appointment in 6 months.  Please call our office 2 months in advance to schedule this appointment.     **Purchase the [employer]. See information below.   KardiaMobile  [email or web address]        FDA-cleared, clinical grade mobile EKG monitor: Jimmy Black is the most clinically-validated mobile EKG used by the world's leading cardiac care medical professionals With Basic service, know instantly if your heart rhythm is normal or if atrial fibrillation is detected, and email the last single EKG recording to yourself or your doctor Premium service, available for purchase through the Kardia app for $9.99 per month or $99 per year, includes unlimited history and storage of your EKG recordings, a monthly EKG summary report to share with your doctor, along with the ability to track your blood pressure, activity and weight Includes one KardiaMobile phone clip FREE SHIPPING: Standard delivery 1-3 business days. Orders placed by 11:00am PST will ship that afternoon. Otherwise, will ship next business day. All orders ship via [employer] from [place], [place]     Echocardiogram An echocardiogram is a procedure that uses painless sound waves (ultrasound) to produce an image of the heart. Images from an echocardiogram can provide important information about:  Signs of coronary artery disease (CAD).  Aneurysm detection. An aneurysm is a weak or damaged part of an artery wall that bulges out from the normal force of blood pumping through the body.  Heart size and shape. Changes in the size or shape of the heart can be associated with certain conditions, including heart failure, aneurysm, and CAD.  Heart muscle function.  Heart valve function.  Signs of a past heart attack.  Fluid buildup around the heart.  Thickening of the heart muscle.  A tumor or infectious growth  around the heart valves. Tell a health care provider about:  Any allergies you have.  All medicines you are taking, including vitamins, herbs, eye drops, creams, and over-the-counter medicines.  Any blood disorders you have.  Any surgeries you have had.  Any medical  conditions you have.  Whether you are pregnant or may be pregnant. What are the risks? Generally, this is a safe procedure. However, problems may occur, including:  Allergic reaction to dye (contrast) that may be used during the procedure. What happens before the procedure? No specific preparation is needed. You may eat and drink normally. What happens during the procedure?   An IV tube may be inserted into one of your veins.  You may receive contrast through this tube. A contrast is an injection that improves the quality of the pictures from your heart.  A gel will be applied to your chest.  A wand-like tool (transducer) will be moved over your chest. The gel will help to transmit the sound waves from the transducer.  The sound waves will harmlessly bounce off of your heart to allow the heart images to be captured in real-time motion. The images will be recorded on a computer. The procedure may vary among health care providers and hospitals. What happens after the procedure?  You may return to your normal, everyday life, including diet, activities, and medicines, unless your health care provider tells you not to do that. Summary  An echocardiogram is a procedure that uses painless sound waves (ultrasound) to produce an image of the heart.  Images from an echocardiogram can provide important information about the size and shape of your heart, heart muscle function, heart valve function, and fluid buildup around your heart.  You do not need to do anything to prepare before this procedure. You may eat and drink normally.  After the echocardiogram is completed, you may return to your normal, everyday life, unless your health care provider tells you not to do that. This information is not intended to replace advice given to you by your health care provider. Make sure you discuss any questions you have with your health care provider. Document Released: 02/05/2000 Document Revised:  05/31/2018 Document Reviewed: 03/12/2016 Elsevier Patient Education  2020 [employer].

## 2018-11-21 LAB — TSH: TSH: 1.11 u[IU]/mL (ref 0.450–4.500)

## 2018-12-24 ENCOUNTER — Encounter: Payer: Self-pay | Admitting: Family Medicine

## 2018-12-24 ENCOUNTER — Telehealth: Payer: Self-pay | Admitting: *Deleted

## 2018-12-24 ENCOUNTER — Other Ambulatory Visit: Payer: Medicare Other

## 2018-12-24 NOTE — Telephone Encounter (Signed)
Patient states when he woke up this morning and got out of bed he noticed when walking he was short of breath and fatigued. He also reports clammy and cold skin with headaches and "the sweats." Patient states he went to take a shower before his echocardiogram scheduled at 2:15 pm today and his heart started racing. He did not check his heart rate but he took one tablet of metoprolol tartrate 25 mg and laid down to rest. His BP within the last hour was 118/81 and 113/73 with a heart rate of 51-63. Patient's temperature is 98.1. He has taken another dose of metoprolol this afternoon and has no complaints when at rest. Patient has rescheduled his echocardiogram for 02/06/2019 at 11:15 am in the [place] office. Please advise. Thanks!

## 2018-12-24 NOTE — Telephone Encounter (Signed)
After speaking with Dr. Harriet Masson, patient advised to contact his PCP for further recommendations regarding symptoms. Patient is agreeable to plan and verbalized understanding. No further questions.

## 2018-12-26 NOTE — Telephone Encounter (Signed)
This was noted, but he really needs to tell Dr. Bettina Gavia what is happening. I suspect he will want to change his treatment because of this

## 2019-01-01 ENCOUNTER — Ambulatory Visit: Payer: Medicare Other | Admitting: Family

## 2019-01-02 ENCOUNTER — Ambulatory Visit: Payer: Medicare Other | Admitting: Family Medicine

## 2019-01-07 ENCOUNTER — Encounter: Payer: Medicare Other | Admitting: Family Medicine

## 2019-02-06 ENCOUNTER — Other Ambulatory Visit: Payer: Medicare Other

## 2019-02-11 ENCOUNTER — Other Ambulatory Visit: Payer: Self-pay | Admitting: Internal Medicine

## 2019-02-18 ENCOUNTER — Other Ambulatory Visit: Payer: Self-pay | Admitting: Internal Medicine

## 2019-02-20 ENCOUNTER — Other Ambulatory Visit: Payer: Self-pay | Admitting: Internal Medicine

## 2019-02-23 ENCOUNTER — Other Ambulatory Visit: Payer: Self-pay | Admitting: Cardiology

## 2019-02-25 ENCOUNTER — Ambulatory Visit (INDEPENDENT_AMBULATORY_CARE_PROVIDER_SITE_OTHER): Payer: Medicare PPO | Admitting: *Deleted

## 2019-02-25 ENCOUNTER — Other Ambulatory Visit: Payer: Self-pay

## 2019-02-25 DIAGNOSIS — Z111 Encounter for screening for respiratory tuberculosis: Secondary | ICD-10-CM | POA: Diagnosis not present

## 2019-02-27 LAB — TB SKIN TEST
Induration: 0 mm
TB Skin Test: NEGATIVE

## 2019-03-07 ENCOUNTER — Other Ambulatory Visit: Payer: Self-pay | Admitting: Family Medicine

## 2019-03-07 DIAGNOSIS — Z0000 Encounter for general adult medical examination without abnormal findings: Secondary | ICD-10-CM

## 2019-03-19 ENCOUNTER — Encounter: Payer: Self-pay | Admitting: Family Medicine

## 2019-03-25 ENCOUNTER — Encounter: Payer: Medicare PPO | Admitting: Family Medicine

## 2019-04-01 ENCOUNTER — Ambulatory Visit (INDEPENDENT_AMBULATORY_CARE_PROVIDER_SITE_OTHER): Payer: Medicare PPO

## 2019-04-01 ENCOUNTER — Other Ambulatory Visit: Payer: Self-pay

## 2019-04-01 DIAGNOSIS — E782 Mixed hyperlipidemia: Secondary | ICD-10-CM

## 2019-04-01 DIAGNOSIS — G4733 Obstructive sleep apnea (adult) (pediatric): Secondary | ICD-10-CM

## 2019-04-01 DIAGNOSIS — I10 Essential (primary) hypertension: Secondary | ICD-10-CM | POA: Diagnosis not present

## 2019-04-01 DIAGNOSIS — R002 Palpitations: Secondary | ICD-10-CM

## 2019-04-01 DIAGNOSIS — I480 Paroxysmal atrial fibrillation: Secondary | ICD-10-CM

## 2019-04-01 NOTE — Progress Notes (Unsigned)
Complete echocardiogram has been performed.  Jimmy Black, RDCS, RVT 

## 2019-04-03 ENCOUNTER — Telehealth: Payer: Self-pay | Admitting: Emergency Medicine

## 2019-04-03 NOTE — Telephone Encounter (Signed)
Left message for patient to return call regarding results  

## 2019-04-05 ENCOUNTER — Encounter: Payer: Medicare PPO | Admitting: Family Medicine

## 2019-04-09 ENCOUNTER — Telehealth: Payer: Self-pay | Admitting: Cardiology

## 2019-04-09 MED ORDER — EPLERENONE 25 MG PO TABS: 25.0000 mg | ORAL_TABLET | Freq: Every day | ORAL | 11 refills | Status: DC

## 2019-04-09 NOTE — Telephone Encounter (Signed)
Very difficult to try to manage hypertension over the telephone.  Please be sure that he is using a digital arm cuff not a wrist start him on Inspra 25 mg daily see me in the office in 2 to 3 weeks face-to-face.

## 2019-04-09 NOTE — Telephone Encounter (Signed)
Spoke with patient. Per Dr. Bettina Gavia, Inspra 25mg  daily added to medication list and sent to preferred pharmacy. Patient scheduled for an office visit with Dr. Bettina Gavia on 2/26 at 1:55pm. Patient verbalized understanding.

## 2019-04-09 NOTE — Telephone Encounter (Signed)
  Pt c/o BP issue: STAT if pt c/o blurred vision, one-sided weakness or slurred speech  1. What are your last 5 BP readings? 174/103, 184/108, 165/110, 174/102, 172/104 pulse has been in the 60's  2. Are you having any other symptoms (ex. Dizziness, headache, blurred vision, passed out)? No other symptoms  3. What is your BP issue? Patient feels like his blood pressure medication is not working.

## 2019-04-09 NOTE — Telephone Encounter (Signed)
Spoke with patient. Patient reports he randomly checked his blood pressure yesterday and it was 174/103. Since yesterday his readings have been 184/108, 165/110, 174/102, 172/104. His current BP is 186/106 and his HR is 56. He reports he increased his MICARDIS to 30mg  for the last two days.   Patient is asymptomatic. No headache, blurred vision, dizziness, weakness or slurred speech. Patient advised to call 911 if he develops symptoms. Will route to primary MD for evaluation.

## 2019-04-10 NOTE — Telephone Encounter (Signed)
Richardo Priest, MD  You 6 minutes ago (1:12 PM)   I would expect to take 7 to 14 days for full effect. Continue to exercise   Message text

## 2019-04-17 ENCOUNTER — Telehealth: Payer: Self-pay

## 2019-04-17 NOTE — Telephone Encounter (Signed)
LMTCB regarding recent Echo results

## 2019-04-17 NOTE — Telephone Encounter (Signed)
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

## 2019-04-19 ENCOUNTER — Ambulatory Visit: Payer: Medicare PPO | Admitting: Cardiology

## 2019-04-19 ENCOUNTER — Encounter: Payer: Self-pay | Admitting: Cardiology

## 2019-04-19 ENCOUNTER — Other Ambulatory Visit: Payer: Self-pay

## 2019-04-19 VITALS — BP 150/100 | HR 80 | Temp 96.6°F | Ht 78.0 in | Wt 270.6 lb

## 2019-04-19 DIAGNOSIS — I10 Essential (primary) hypertension: Secondary | ICD-10-CM

## 2019-04-19 DIAGNOSIS — I480 Paroxysmal atrial fibrillation: Secondary | ICD-10-CM

## 2019-04-19 DIAGNOSIS — G4733 Obstructive sleep apnea (adult) (pediatric): Secondary | ICD-10-CM | POA: Diagnosis not present

## 2019-04-19 DIAGNOSIS — E782 Mixed hyperlipidemia: Secondary | ICD-10-CM | POA: Diagnosis not present

## 2019-04-19 NOTE — Progress Notes (Signed)
Cardiology Office Note:    Date:  04/19/2019   ID:  Jimmy Black, DOB 29-Dec-1960, MRN [id number]  PCP:  Eulas Post, MD  Cardiologist:  Shirlee More, MD    Referring MD: Eulas Post, MD    ASSESSMENT:    1. Paroxysmal atrial fibrillation (HCC)   2. Essential hypertension   3. Mixed hyperlipidemia   4. Obstructive sleep apnea    PLAN:    In order of problems listed above:  1. Stable no recurrence 2. Continue current treatment purchase a large cuff for home measurement 3. Continue statin 4. Awaiting CPAP titration   Next appointment: 6 months   Medication Adjustments/Labs and Tests Ordered: Current medicines are reviewed at length with the patient today.  Concerns regarding medicines are outlined above.  No orders of the defined types were placed in this encounter.  No orders of the defined types were placed in this encounter.   Chief Complaint  Patient presents with  . Follow-up  . Atrial Fibrillation  . Hypertension    History of Present Illness:    Jimmy Black is a 59 y.o. male with a hx of paroxysmal atrial fibrillation last seen 11/20/2018. Compliance with diet, lifestyle and medications: Yes  He continues to lose weight he feels well home blood pressure runs on average 146/80.  I rechecked his blood pressure using a large cuff he is 10 mmHg less than the office I asked him to purchase a large cuff and I suspect there is a discrepancy in his blood pressure truly is at target.  He snores severely and is going in for CPAP titration.  His blood pressure is at target continue current treatment encourage him to follow through with treatment of his sleep apnea.  I reviewed a CT of the chest from [facility] 10/26/2018 with the thoracic aorta is described as normal.  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  Other findings included mild left atrial enlargement and mild left  ventricular hypertrophy.  His TSH was normal 5 months ago. Past Medical History:  Diagnosis Date  . Alcohol abuse   . Anxiety    takes Paxil daily  . Atrial fibrillation ([place]) 11/16/2018  . Chicken pox   . Family history of anesthesia complication    brother got sick after anesthesia  . Hyperlipidemia    takes Fish Oil daily  . Hypertension 03/20/2018  . Joint pain   . Joint swelling   . Numbness    both leg and feet  . Obstructive sleep apnea 06/21/2015  . Osteoarthritis    "right knee; both hips" (12/03/2012)  . Palpitations 11/16/2018  . Peripheral neuropathy     Past Surgical History:  Procedure Laterality Date  . EXCISIONAL TOTAL KNEE ARTHROPLASTY Right 12/03/2012   Procedure: EXCISIONAL TOTAL KNEE ARTHROPLASTY (POLY SWAP), SCAR TISSUE REMOVAL, QUADRICEPSPLASTY;  Surgeon: Vickey Huger, MD;  Location: [place];  Service: Orthopedics;  Laterality: Right;  . INCISION AND DRAINAGE OF WOUND Right 2006   "w/jet lavage; had to do this twice after the OR" (12/03/2012)  . [place]   "benign tumor removal" (12/03/2012)  . SHOULDER ARTHROSCOPY WITH OPEN ROTATOR CUFF REPAIR Right 2006  . SHOULDER ARTHROSCOPY WITH OPEN ROTATOR CUFF REPAIR Left 06/2012  . TOTAL KNEE ARTHROPLASTY  11/16/2011   Procedure: TOTAL KNEE ARTHROPLASTY;  Surgeon: Kerin Salen, MD;  Location: [place];  Service: Orthopedics;  Laterality: Right;  right  total knee arthroplasty  . TOTAL KNEE REVISION WITH SCAR DEBRIDEMENT/PATELLA REVISION WITH POLY EXCHANGE Right 12/03/2012   "not sure what they did exactly" (10/'13/2014)    Current Medications: Current Meds  Medication Sig  . aspirin EC 81 MG tablet Take 81 mg by mouth daily.  . Buprenorphine HCl-Naloxone HCl 8-2 MG FILM 1 Film every 8 (eight) hours.  Marland Kitchen eplerenone (INSPRA) 25 MG tablet Take 1 tablet (25 mg total) by mouth daily.  . Garlic 123XX123 MG TABS Take 1 tablet by mouth 3 (three) times daily.   Marland Kitchen ibuprofen (ADVIL,MOTRIN) 200 MG tablet Take  600 mg by mouth daily as needed for pain.  . Multiple Vitamins-Minerals (MENS MULTI VITAMIN & MINERAL PO) Take 1 tablet by mouth daily.   . niacin 500 MG tablet Take 500 mg by mouth at bedtime.  . Omega-3 Fatty Acids (FISH OIL CONCENTRATE PO) Take 1 capsule by mouth 3 (three) times daily.   Marland Kitchen PARoxetine (PAXIL) 40 MG tablet Take 40 mg by mouth every other day.  . rosuvastatin (CRESTOR) 20 MG tablet TAKE 1 TABLET BY MOUTH EVERY DAY - CALL FOR LAB APPOINTMENT  . telmisartan (MICARDIS) 20 MG tablet TAKE 1 TABLET BY MOUTH EVERY DAY     Allergies:   Patient has no known allergies.   Social History   Socioeconomic History  . Marital status: Divorced    Spouse name: Not on file  . Number of children: Not on file  . Years of education: Not on file  . Highest education level: Not on file  Occupational History  . Not on file  Tobacco Use  . Smoking status: Never Smoker  . Smokeless tobacco: Current User    Types: Snuff, Chew  Substance and Sexual Activity  . Alcohol use: Not Currently    Comment: 12/03/2012 "quit all alcohol in 1998; family hx of problems w/it"  . Drug use: No  . Sexual activity: Not Currently  Other Topics Concern  . Not on file  Social History Narrative  . Not on file   Social Determinants of Health   Financial Resource Strain:   . Difficulty of Paying Living Expenses: Not on file  Food Insecurity:   . Worried About Charity fundraiser in the Last Year: Not on file  . Ran Out of Food in the Last Year: Not on file  Transportation Needs:   . Lack of Transportation (Medical): Not on file  . Lack of Transportation (Non-Medical): Not on file  Physical Activity:   . Days of Exercise per Week: Not on file  . Minutes of Exercise per Session: Not on file  Stress:   . Feeling of Stress : Not on file  Social Connections:   . Frequency of Communication with Friends and Family: Not on file  . Frequency of Social Gatherings with Friends and Family: Not on file  .  Attends Religious Services: Not on file  . Active Member of Clubs or Organizations: Not on file  . Attends Archivist Meetings: Not on file  . Marital Status: Not on file     Family History: The patient's family history includes Alcohol abuse in his paternal aunt and paternal grandfather; Cancer in his father and maternal aunt; Cancer (age of onset: 46) in his mother; Heart disease in his maternal grandfather, mother, and paternal grandfather; Hyperlipidemia in his brother and mother. ROS:   Please see the history of present illness.    All other systems reviewed and are negative.  EKGs/Labs/Other Studies Reviewed:    The following studies were reviewed today:   Recent Labs: 11/20/2018: TSH 1.110  Recent Lipid Panel    Component Value Date/Time   CHOL 201 (H) 03/20/2018 1206   TRIG 242.0 (H) 03/20/2018 1206   HDL 34.80 (L) 03/20/2018 1206   CHOLHDL 6 03/20/2018 1206   VLDL 48.4 (H) 03/20/2018 1206   LDLDIRECT 110.0 03/20/2018 1206    Physical Exam:    VS:  BP (!) 150/100   Pulse 80   Temp (!) 96.6 F (35.9 C)   Ht 6\' 6"  (1.981 m)   Wt 270 lb 9.6 oz (122.7 kg)   SpO2 97%   BMI 31.27 kg/m     Wt Readings from Last 3 Encounters:  04/19/19 270 lb 9.6 oz (122.7 kg)  11/20/18 272 lb 6.4 oz (123.6 kg)  03/20/18 283 lb 4.8 oz (128.5 kg)    P blood pressure by me 136/80 GEN:  Well nourished, well developed in no acute distress HEENT: Normal NECK: No JVD; No carotid bruits LYMPHATICS: No lymphadenopathy CARDIAC: RRR, no murmurs, rubs, gallops RESPIRATORY:  Clear to auscultation without rales, wheezing or rhonchi  ABDOMEN: Soft, non-tender, non-distended MUSCULOSKELETAL:  No edema; No deformity  SKIN: Warm and dry NEUROLOGIC:  Alert and oriented x 3 PSYCHIATRIC:  Normal affect    Signed, Shirlee More, MD  04/19/2019 2:45 PM    [facility]

## 2019-04-19 NOTE — Patient Instructions (Signed)
Medication Instructions:  Your physician recommends that you continue on your current medications as directed. Please refer to the Current Medication list given to you today.  *If you need a refill on your cardiac medications before your next appointment, please call your pharmacy*   Lab Work: None If you have labs (blood work) drawn today and your tests are completely normal, you will receive your results only by: Marland Kitchen MyChart Message (if you have MyChart) OR . A paper copy in the mail If you have any lab test that is abnormal or we need to change your treatment, we will call you to review the results.   Testing/Procedures: None   Follow-Up: At [facility], you and your health needs are our priority.  As part of our continuing mission to provide you with exceptional heart care, we have created designated Provider Care Teams.  These Care Teams include your primary Cardiologist (physician) and Advanced Practice Providers (APPs -  Physician Assistants and Nurse Practitioners) who all work together to provide you with the care you need, when you need it.  We recommend signing up for the patient portal called "MyChart".  Sign up information is provided on this After Visit Summary.  MyChart is used to connect with patients for Virtual Visits (Telemedicine).  Patients are able to view lab/test results, encounter notes, upcoming appointments, etc.  Non-urgent messages can be sent to your provider as well.   To learn more about what you can do with MyChart, go to [email or web address].    Your next appointment:   6 month(s)  The format for your next appointment:   In Person  Provider:   Shirlee More, MD   Other Instructions Purchase large blood pressure cuff.

## 2019-05-10 ENCOUNTER — Encounter: Payer: Medicare PPO | Admitting: Family Medicine

## 2019-05-16 ENCOUNTER — Other Ambulatory Visit: Payer: Self-pay | Admitting: Family Medicine

## 2019-06-01 ENCOUNTER — Other Ambulatory Visit: Payer: Self-pay | Admitting: Family Medicine

## 2019-06-01 DIAGNOSIS — Z0000 Encounter for general adult medical examination without abnormal findings: Secondary | ICD-10-CM

## 2019-06-03 NOTE — Telephone Encounter (Signed)
Please advise you were not prescriber in past for this medication

## 2019-06-03 NOTE — Telephone Encounter (Signed)
Patient no longer needs ct per Dr. Bettina Gavia.

## 2019-06-04 NOTE — Telephone Encounter (Signed)
Tried to call pt to confirm pt is asking [place] to take this over

## 2019-06-04 NOTE — Telephone Encounter (Signed)
Confirm if pt is asking [place] to take over that prescription.

## 2019-06-11 ENCOUNTER — Ambulatory Visit (INDEPENDENT_AMBULATORY_CARE_PROVIDER_SITE_OTHER): Payer: Medicare PPO | Admitting: Family Medicine

## 2019-06-11 ENCOUNTER — Encounter: Payer: Self-pay | Admitting: Family Medicine

## 2019-06-11 ENCOUNTER — Other Ambulatory Visit: Payer: Self-pay

## 2019-06-11 VITALS — BP 122/78 | HR 72 | Temp 97.6°F | Ht 78.0 in | Wt 269.8 lb

## 2019-06-11 DIAGNOSIS — G8929 Other chronic pain: Secondary | ICD-10-CM

## 2019-06-11 DIAGNOSIS — Z0000 Encounter for general adult medical examination without abnormal findings: Secondary | ICD-10-CM

## 2019-06-11 DIAGNOSIS — M792 Neuralgia and neuritis, unspecified: Secondary | ICD-10-CM | POA: Diagnosis not present

## 2019-06-11 HISTORY — DX: Other chronic pain: G89.29

## 2019-06-11 LAB — HEPATIC FUNCTION PANEL
ALT: 33 U/L (ref 0–53)
AST: 31 U/L (ref 0–37)
Albumin: 4.8 g/dL (ref 3.5–5.2)
Alkaline Phosphatase: 60 U/L (ref 39–117)
Bilirubin, Direct: 0.1 mg/dL (ref 0.0–0.3)
Total Bilirubin: 0.8 mg/dL (ref 0.2–1.2)
Total Protein: 7.2 g/dL (ref 6.0–8.3)

## 2019-06-11 LAB — PSA: PSA: 0.51 ng/mL (ref 0.10–4.00)

## 2019-06-11 LAB — LIPID PANEL
Cholesterol: 134 mg/dL (ref 0–200)
HDL: 44.1 mg/dL (ref >39.00)
LDL Cholesterol: 69 mg/dL (ref 0–99)
NonHDL: 90.07
Total CHOL/HDL Ratio: 3
Triglycerides: 103 mg/dL (ref 0.0–149.0)
VLDL: 20.6 mg/dL (ref 0.0–40.0)

## 2019-06-11 LAB — BASIC METABOLIC PANEL
BUN: 20 mg/dL (ref 6–23)
CO2: 29 mEq/L (ref 19–32)
Calcium: 10.1 mg/dL (ref 8.4–10.5)
Chloride: 103 mEq/L (ref 96–112)
Creatinine, Ser: 0.75 mg/dL (ref 0.40–1.50)
GFR: 107.47 mL/min (ref >60.00)
Glucose, Bld: 92 mg/dL (ref 70–99)
Potassium: 5 mEq/L (ref 3.5–5.1)
Sodium: 139 mEq/L (ref 135–145)

## 2019-06-11 LAB — CBC WITH DIFFERENTIAL/PLATELET
Basophils Absolute: 0.1 10*3/uL (ref 0.0–0.1)
Basophils Relative: 0.8 % (ref 0.0–3.0)
Eosinophils Absolute: 0.3 10*3/uL (ref 0.0–0.7)
Eosinophils Relative: 3.5 % (ref 0.0–5.0)
HCT: 41 % (ref 39.0–52.0)
Hemoglobin: 14.1 g/dL (ref 13.0–17.0)
Lymphocytes Relative: 21.4 % (ref 12.0–46.0)
Lymphs Abs: 1.7 10*3/uL (ref 0.7–4.0)
MCHC: 34.3 g/dL (ref 30.0–36.0)
MCV: 89.5 fl (ref 78.0–100.0)
Monocytes Absolute: 0.5 10*3/uL (ref 0.1–1.0)
Monocytes Relative: 6.5 % (ref 3.0–12.0)
Neutro Abs: 5.3 10*3/uL (ref 1.4–7.7)
Neutrophils Relative %: 67.8 % (ref 43.0–77.0)
Platelets: 234 10*3/uL (ref 150.0–400.0)
RBC: 4.59 Mil/uL (ref 4.22–5.81)
RDW: 14.8 % (ref 11.5–15.5)
WBC: 7.8 10*3/uL (ref 4.0–10.5)

## 2019-06-11 LAB — TSH: TSH: 1.54 u[IU]/mL (ref 0.35–4.50)

## 2019-06-11 MED ORDER — PAROXETINE HCL 20 MG PO TABS: 20.0000 mg | ORAL_TABLET | Freq: Every day | ORAL | 1 refills | Status: DC

## 2019-06-11 NOTE — Patient Instructions (Signed)
Consider Shingrix vaccine and check on coverage with your insurance company  We are setting up colonoscopy.  Taper  Off Paxil - consider 20 mg daily for 2-3 weeks and then one half daily for another 2 weeks and then discontinue

## 2019-06-11 NOTE — Progress Notes (Signed)
Subjective:     Patient ID: Jimmy Black, male   DOB: 12/24/1960, 59 y.o.   MRN: [id number]  HPI Jimmy Black has past history of transient atrial fibrillation, hypertension, obstructive sleep apnea, significant osteoarthritis involving multiple joints, history of chronic neuropathy pains followed by pain management  He has been on Paxil for several years and would like to consider tapering off.  He took this predominantly for anxiety symptoms.  He has not had previous colonoscopy.  He tried [facility] last year but apparently for some reason they cannot read his result.  He would like to go ahead and get colonoscopy.  No history of hepatitis C screening.  Low risk.  No history of shingles vaccine.  His tetanus is up-to-date.  He has not gotten Covid vaccine and is not interested in pursuing at this time.  He has done an great job with weight loss through more activity with exercise and scaling back overall calories.  He has sleep apnea currently not treated but is getting his CPAP equipment soon  Past Medical History:  Diagnosis Date  . Alcohol abuse   . Anxiety    takes Paxil daily  . Atrial fibrillation ([place]) 11/16/2018  . Chicken pox   . Family history of anesthesia complication    brother got sick after anesthesia  . Hyperlipidemia    takes Fish Oil daily  . Hypertension 03/20/2018  . Joint pain   . Joint swelling   . Numbness    both leg and feet  . Obstructive sleep apnea 06/21/2015  . Osteoarthritis    "right knee; both hips" (12/03/2012)  . Palpitations 11/16/2018  . Peripheral neuropathy    Past Surgical History:  Procedure Laterality Date  . EXCISIONAL TOTAL KNEE ARTHROPLASTY Right 12/03/2012   Procedure: EXCISIONAL TOTAL KNEE ARTHROPLASTY (POLY SWAP), SCAR TISSUE REMOVAL, QUADRICEPSPLASTY;  Surgeon: Vickey Huger, MD;  Location: [place];  Service: Orthopedics;  Laterality: Right;  . INCISION AND DRAINAGE OF WOUND Right 2006   "w/jet lavage; had to do this twice after the  OR" (12/03/2012)  . [place]   "benign tumor removal" (12/03/2012)  . SHOULDER ARTHROSCOPY WITH OPEN ROTATOR CUFF REPAIR Right 2006  . SHOULDER ARTHROSCOPY WITH OPEN ROTATOR CUFF REPAIR Left 06/2012  . TOTAL KNEE ARTHROPLASTY  11/16/2011   Procedure: TOTAL KNEE ARTHROPLASTY;  Surgeon: Kerin Salen, MD;  Location: [place];  Service: Orthopedics;  Laterality: Right;  right total knee arthroplasty  . TOTAL KNEE REVISION WITH SCAR DEBRIDEMENT/PATELLA REVISION WITH POLY EXCHANGE Right 12/03/2012   "not sure what they did exactly" (10/'13/2014)    reports that he has never smoked. His smokeless tobacco use includes snuff and chew. He reports previous alcohol use. He reports that he does not use drugs. family history includes Alcohol abuse in his paternal aunt and paternal grandfather; Cancer in his father and maternal aunt; Cancer (age of onset: 61) in his mother; Heart disease in his maternal grandfather, mother, and paternal grandfather; Hyperlipidemia in his brother and mother. No Known Allergies  Wt Readings from Last 3 Encounters:  06/11/19 269 lb 12.8 oz (122.4 kg)  04/19/19 270 lb 9.6 oz (122.7 kg)  11/20/18 272 lb 6.4 oz (123.6 kg)     Review of Systems  Constitutional: Negative for activity change, appetite change, fatigue and fever.  HENT: Negative for congestion, ear pain and trouble swallowing.   Eyes: Negative for pain and visual disturbance.  Respiratory: Negative for cough, chest tightness, shortness of breath  and wheezing.   Cardiovascular: Negative for chest pain, palpitations and leg swelling.  Gastrointestinal: Negative for abdominal distention, abdominal pain, blood in stool, constipation, diarrhea, nausea, rectal pain and vomiting.  Endocrine: Negative for polydipsia and polyuria.  Genitourinary: Negative for dysuria, hematuria and testicular pain.  Musculoskeletal: Negative for arthralgias and joint swelling.  Skin: Negative for rash.   Neurological: Negative for dizziness, syncope, weakness, light-headedness and headaches.  Hematological: Negative for adenopathy.  Psychiatric/Behavioral: Negative for confusion and dysphoric mood.       Objective:   Physical Exam Constitutional:      General: He is not in acute distress.    Appearance: He is well-developed.  HENT:     Head: Normocephalic and atraumatic.     Right Ear: External ear normal.     Left Ear: External ear normal.  Eyes:     Conjunctiva/sclera: Conjunctivae normal.     Pupils: Pupils are equal, round, and reactive to light.  Neck:     Thyroid: No thyromegaly.  Cardiovascular:     Rate and Rhythm: Normal rate and regular rhythm.     Heart sounds: Normal heart sounds. No murmur.  Pulmonary:     Effort: No respiratory distress.     Breath sounds: No wheezing or rales.  Abdominal:     General: Bowel sounds are normal. There is no distension.     Palpations: Abdomen is soft. There is no mass.     Tenderness: There is no abdominal tenderness. There is no guarding or rebound.  Musculoskeletal:     Cervical back: Normal range of motion and neck supple.     Right lower leg: No edema.     Left lower leg: No edema.  Lymphadenopathy:     Cervical: No cervical adenopathy.  Skin:    Findings: No rash.  Neurological:     Mental Status: He is alert and oriented to person, place, and time.     Cranial Nerves: No cranial nerve deficit.     Deep Tendon Reflexes: Reflexes normal.  Psychiatric:        Mood and Affect: Mood normal.        Thought Content: Thought content normal.        Assessment:     Physical exam.  Patient has multiple chronic problems as above.  We discussed the following health maintenance issues    Plan:     -Obtain screening labs.  Will include hepatitis C antibody which he has not had previously  -The natural history of prostate cancer and ongoing controversy regarding screening and potential treatment outcomes of prostate cancer  has been discussed with the patient. The meaning of a false positive PSA and a false negative PSA has been discussed. He indicates understanding of the limitations of this screening test and wishes to proceed with screening PSA testing.  -Set up initial screening colonoscopy  -We discussed shingles vaccine and he will check on insurance coverage  -Continue weight loss efforts  Eulas Post MD [facility]

## 2019-06-12 ENCOUNTER — Encounter: Payer: Self-pay | Admitting: Gastroenterology

## 2019-06-12 LAB — HEPATITIS C ANTIBODY
Hepatitis C Ab: NONREACTIVE
SIGNAL TO CUT-OFF: 0.01 (ref <1.00)

## 2019-06-27 ENCOUNTER — Other Ambulatory Visit: Payer: Self-pay

## 2019-06-27 ENCOUNTER — Ambulatory Visit (AMBULATORY_SURGERY_CENTER): Payer: Self-pay | Admitting: *Deleted

## 2019-06-27 VITALS — Temp 97.1°F | Ht 78.0 in | Wt 274.0 lb

## 2019-06-27 DIAGNOSIS — Z1211 Encounter for screening for malignant neoplasm of colon: Secondary | ICD-10-CM

## 2019-06-27 DIAGNOSIS — Z01818 Encounter for other preprocedural examination: Secondary | ICD-10-CM

## 2019-06-27 NOTE — Progress Notes (Signed)
No egg or soy allergy known to patient  No issues with past sedation with any surgeries  or procedures, no intubation problems  No diet pills per patient No home 02 use per patient  No blood thinners per patient  Pt denies issues with constipation  No A fib or A flutter  EMMI video sent to pt's e mail    Pt instructed at [facility] tha he can not use chewing tobacco or snuff Thursday 5-20 at all until after Colon or he would be canceled- he verbalized understanding   Due to the COVID-19 pandemic we are asking patients to follow these guidelines. Please only bring one care partner. Please be aware that your care partner may wait in the car in the parking lot or if they feel like they will be too hot to wait in the car, they may wait in the lobby on the 4th floor. All care partners are required to wear a mask the entire time (we do not have any that we can provide them), they need to practice social distancing, and we will do a Covid check for all patient's and care partners when you arrive. Also we will check their temperature and your temperature. If the care partner waits in their car they need to stay in the parking lot the entire time and we will call them on their cell phone when the patient is ready for discharge so they can bring the car to the front of the building. Also all patient's will need to wear a mask into building.

## 2019-07-04 ENCOUNTER — Other Ambulatory Visit: Payer: Self-pay | Admitting: Family Medicine

## 2019-07-11 ENCOUNTER — Encounter: Payer: Medicare PPO | Admitting: Gastroenterology

## 2019-07-31 DIAGNOSIS — F1120 Opioid dependence, uncomplicated: Secondary | ICD-10-CM | POA: Diagnosis not present

## 2019-07-31 DIAGNOSIS — Z79899 Other long term (current) drug therapy: Secondary | ICD-10-CM | POA: Diagnosis not present

## 2019-07-31 DIAGNOSIS — Z79891 Long term (current) use of opiate analgesic: Secondary | ICD-10-CM | POA: Diagnosis not present

## 2019-08-05 ENCOUNTER — Encounter: Payer: Self-pay | Admitting: Family Medicine

## 2019-08-12 DIAGNOSIS — G4733 Obstructive sleep apnea (adult) (pediatric): Secondary | ICD-10-CM | POA: Diagnosis not present

## 2019-08-14 ENCOUNTER — Other Ambulatory Visit: Payer: Self-pay | Admitting: Family Medicine

## 2019-08-15 DIAGNOSIS — Z79891 Long term (current) use of opiate analgesic: Secondary | ICD-10-CM | POA: Diagnosis not present

## 2019-08-15 DIAGNOSIS — Z79899 Other long term (current) drug therapy: Secondary | ICD-10-CM | POA: Diagnosis not present

## 2019-08-15 DIAGNOSIS — F1120 Opioid dependence, uncomplicated: Secondary | ICD-10-CM | POA: Diagnosis not present

## 2019-08-27 MED ORDER — TELMISARTAN 20 MG PO TABS: 20.0000 mg | ORAL_TABLET | Freq: Every day | ORAL | 1 refills | Status: DC

## 2019-09-05 DIAGNOSIS — Z79899 Other long term (current) drug therapy: Secondary | ICD-10-CM | POA: Diagnosis not present

## 2019-09-05 DIAGNOSIS — Z79891 Long term (current) use of opiate analgesic: Secondary | ICD-10-CM | POA: Diagnosis not present

## 2019-09-05 DIAGNOSIS — F1120 Opioid dependence, uncomplicated: Secondary | ICD-10-CM | POA: Diagnosis not present

## 2019-09-10 ENCOUNTER — Other Ambulatory Visit: Payer: Self-pay | Admitting: Pain Medicine

## 2019-09-10 ENCOUNTER — Ambulatory Visit
Admission: RE | Admit: 2019-09-10 | Discharge: 2019-09-10 | Disposition: A | Discharge status: Home / Self Care | Payer: Medicare PPO | Source: Ambulatory Visit | Attending: Pain Medicine | Admitting: Pain Medicine

## 2019-09-10 ENCOUNTER — Other Ambulatory Visit: Payer: Self-pay

## 2019-09-10 DIAGNOSIS — M25561 Pain in right knee: Secondary | ICD-10-CM

## 2019-09-10 DIAGNOSIS — M25552 Pain in left hip: Secondary | ICD-10-CM

## 2019-09-10 DIAGNOSIS — G8929 Other chronic pain: Secondary | ICD-10-CM | POA: Diagnosis not present

## 2019-09-10 DIAGNOSIS — Z1211 Encounter for screening for malignant neoplasm of colon: Secondary | ICD-10-CM | POA: Diagnosis not present

## 2019-09-10 DIAGNOSIS — M47816 Spondylosis without myelopathy or radiculopathy, lumbar region: Secondary | ICD-10-CM | POA: Diagnosis not present

## 2019-09-10 DIAGNOSIS — M1612 Unilateral primary osteoarthritis, left hip: Secondary | ICD-10-CM | POA: Diagnosis not present

## 2019-09-10 LAB — COLOGUARD: Cologuard: NEGATIVE

## 2019-09-11 ENCOUNTER — Telehealth: Payer: Self-pay | Admitting: Family Medicine

## 2019-09-11 DIAGNOSIS — G4733 Obstructive sleep apnea (adult) (pediatric): Secondary | ICD-10-CM | POA: Diagnosis not present

## 2019-09-11 NOTE — Telephone Encounter (Signed)
Jimmy Black with [facility] is calling stating that the pt called in stating what his name is and [facility] would like to know which way we prefer to have the pts name the way we have it in our system or the way the orders was sent in without the "J".  You can give them a call back and reference # [id number]

## 2019-09-11 NOTE — Telephone Encounter (Signed)
Called [facility] they just needed to confirm middle name intial

## 2019-09-13 LAB — COLOGUARD: COLOGUARD: NEGATIVE

## 2019-09-25 DIAGNOSIS — F1120 Opioid dependence, uncomplicated: Secondary | ICD-10-CM | POA: Diagnosis not present

## 2019-09-25 DIAGNOSIS — Z79899 Other long term (current) drug therapy: Secondary | ICD-10-CM | POA: Diagnosis not present

## 2019-09-25 DIAGNOSIS — Z79891 Long term (current) use of opiate analgesic: Secondary | ICD-10-CM | POA: Diagnosis not present

## 2019-10-09 ENCOUNTER — Encounter: Payer: Self-pay | Admitting: Family Medicine

## 2019-10-12 DIAGNOSIS — G4733 Obstructive sleep apnea (adult) (pediatric): Secondary | ICD-10-CM | POA: Diagnosis not present

## 2019-10-16 DIAGNOSIS — F1120 Opioid dependence, uncomplicated: Secondary | ICD-10-CM | POA: Diagnosis not present

## 2019-10-16 DIAGNOSIS — Z79891 Long term (current) use of opiate analgesic: Secondary | ICD-10-CM | POA: Diagnosis not present

## 2019-10-16 DIAGNOSIS — Z79899 Other long term (current) drug therapy: Secondary | ICD-10-CM | POA: Diagnosis not present

## 2019-11-04 ENCOUNTER — Encounter: Payer: Self-pay | Admitting: Family Medicine

## 2019-11-04 NOTE — Telephone Encounter (Signed)
Jimmy Black from [employer] stated the pt reached out to them stating they have not received their cologuard results and neither has the PCP. The test was done on 7/23 and results were sent back to Epic care link. She is calling to verify we have them.   [employer] can be reached at [phone]

## 2019-11-04 NOTE — Telephone Encounter (Signed)
Spoke with [employer]. Pt's test was negative. Message sent to pt & chart updated.

## 2019-11-06 ENCOUNTER — Encounter: Payer: Self-pay | Admitting: Family Medicine

## 2019-11-12 ENCOUNTER — Other Ambulatory Visit: Payer: Self-pay | Admitting: Family Medicine

## 2019-11-12 DIAGNOSIS — G4733 Obstructive sleep apnea (adult) (pediatric): Secondary | ICD-10-CM | POA: Diagnosis not present

## 2019-11-13 DIAGNOSIS — F1120 Opioid dependence, uncomplicated: Secondary | ICD-10-CM | POA: Diagnosis not present

## 2019-11-13 DIAGNOSIS — Z79891 Long term (current) use of opiate analgesic: Secondary | ICD-10-CM | POA: Diagnosis not present

## 2019-11-13 DIAGNOSIS — Z79899 Other long term (current) drug therapy: Secondary | ICD-10-CM | POA: Diagnosis not present

## 2019-11-27 DIAGNOSIS — F1120 Opioid dependence, uncomplicated: Secondary | ICD-10-CM | POA: Diagnosis not present

## 2019-11-27 DIAGNOSIS — Z79899 Other long term (current) drug therapy: Secondary | ICD-10-CM | POA: Diagnosis not present

## 2019-11-27 DIAGNOSIS — Z79891 Long term (current) use of opiate analgesic: Secondary | ICD-10-CM | POA: Diagnosis not present

## 2019-12-12 ENCOUNTER — Ambulatory Visit: Payer: Medicare PPO | Admitting: Cardiology

## 2019-12-12 DIAGNOSIS — G4733 Obstructive sleep apnea (adult) (pediatric): Secondary | ICD-10-CM | POA: Diagnosis not present

## 2019-12-18 DIAGNOSIS — Z79891 Long term (current) use of opiate analgesic: Secondary | ICD-10-CM | POA: Diagnosis not present

## 2019-12-18 DIAGNOSIS — F1120 Opioid dependence, uncomplicated: Secondary | ICD-10-CM | POA: Diagnosis not present

## 2019-12-18 DIAGNOSIS — Z79899 Other long term (current) drug therapy: Secondary | ICD-10-CM | POA: Diagnosis not present

## 2019-12-30 ENCOUNTER — Ambulatory Visit: Payer: Medicare PPO | Admitting: Cardiology

## 2020-01-06 DIAGNOSIS — M1612 Unilateral primary osteoarthritis, left hip: Secondary | ICD-10-CM | POA: Diagnosis not present

## 2020-01-07 ENCOUNTER — Other Ambulatory Visit: Payer: Self-pay | Admitting: Family Medicine

## 2020-01-08 DIAGNOSIS — Z79891 Long term (current) use of opiate analgesic: Secondary | ICD-10-CM | POA: Diagnosis not present

## 2020-01-08 DIAGNOSIS — F1120 Opioid dependence, uncomplicated: Secondary | ICD-10-CM | POA: Diagnosis not present

## 2020-01-08 DIAGNOSIS — Z79899 Other long term (current) drug therapy: Secondary | ICD-10-CM | POA: Diagnosis not present

## 2020-01-10 DIAGNOSIS — R200 Anesthesia of skin: Secondary | ICD-10-CM | POA: Insufficient documentation

## 2020-01-10 DIAGNOSIS — M1990 Unspecified osteoarthritis, unspecified site: Secondary | ICD-10-CM | POA: Insufficient documentation

## 2020-01-10 DIAGNOSIS — M2540 Effusion, unspecified joint: Secondary | ICD-10-CM | POA: Insufficient documentation

## 2020-01-10 DIAGNOSIS — Z8489 Family history of other specified conditions: Secondary | ICD-10-CM | POA: Insufficient documentation

## 2020-01-10 DIAGNOSIS — B019 Varicella without complication: Secondary | ICD-10-CM | POA: Insufficient documentation

## 2020-01-10 DIAGNOSIS — G4730 Sleep apnea, unspecified: Secondary | ICD-10-CM | POA: Insufficient documentation

## 2020-01-10 DIAGNOSIS — G629 Polyneuropathy, unspecified: Secondary | ICD-10-CM | POA: Insufficient documentation

## 2020-01-10 DIAGNOSIS — M2550 Pain in unspecified joint: Secondary | ICD-10-CM | POA: Insufficient documentation

## 2020-01-10 DIAGNOSIS — F419 Anxiety disorder, unspecified: Secondary | ICD-10-CM | POA: Insufficient documentation

## 2020-01-10 DIAGNOSIS — F1010 Alcohol abuse, uncomplicated: Secondary | ICD-10-CM | POA: Insufficient documentation

## 2020-01-12 DIAGNOSIS — G4733 Obstructive sleep apnea (adult) (pediatric): Secondary | ICD-10-CM | POA: Diagnosis not present

## 2020-01-15 ENCOUNTER — Ambulatory Visit: Payer: Medicare PPO | Admitting: Cardiology

## 2020-01-19 NOTE — Progress Notes (Deleted)
Cardiology Office Note:    Date:  01/19/2020   ID:  Jimmy Black, DOB January 24, 1961, MRN [id number]  PCP:  Eulas Post, MD  Cardiologist:  Shirlee More, MD    Referring MD: Eulas Post, MD    ASSESSMENT:    No diagnosis found. PLAN:    In order of problems listed above:  1. ***   Next appointment: ***   Medication Adjustments/Labs and Tests Ordered: Current medicines are reviewed at length with the patient today.  Concerns regarding medicines are outlined above.  No orders of the defined types were placed in this encounter.  No orders of the defined types were placed in this encounter.   No chief complaint on file.   History of Present Illness:    Jimmy Black is a 59 y.o. male with a hx of paroxysmal atrial fibrillation obstructive sleep apnea hypertensive heart disease and hyper lipidemia.  He was last seen 04/19/2019 with a notation that I reviewed his CT of the chest [facility] 10/26/2018 where the thoracic aorta is described as normal and echocardiogram suggesting enlargement at 42 mm 04/01/2019.  I reviewed those images I do not think that there is a significant enlargement of the aorta it was measured quite high towards the arch and the measurement was not perpendicular to the coaxial. Compliance with diet, lifestyle and medications: *** Past Medical History:  Diagnosis Date  . Alcohol abuse   . Anxiety    takes Paxil daily  . Atrial fibrillation ([place]) 11/16/2018  . Chicken pox   . Family history of anesthesia complication    brother got sick after anesthesia  . Hyperlipidemia    takes Fish Oil daily  . Hypertension 03/20/2018  . Joint pain   . Joint swelling   . Numbness    both leg and feet  . Obstructive sleep apnea 06/21/2015  . Osteoarthritis    "right knee; both hips" (12/03/2012)  . Palpitations 11/16/2018  . Peripheral neuropathy   . Sleep apnea    not started cpap as of 5-6 -2021 pv     Past Surgical  History:  Procedure Laterality Date  . EXCISIONAL TOTAL KNEE ARTHROPLASTY Right 12/03/2012   Procedure: EXCISIONAL TOTAL KNEE ARTHROPLASTY (POLY SWAP), SCAR TISSUE REMOVAL, QUADRICEPSPLASTY;  Surgeon: Vickey Huger, MD;  Location: [place];  Service: Orthopedics;  Laterality: Right;  . INCISION AND DRAINAGE OF WOUND Right 2006   "w/jet lavage; had to do this twice after the OR" (12/03/2012)  . [place]   "benign tumor removal" (12/03/2012)  . SHOULDER ARTHROSCOPY WITH OPEN ROTATOR CUFF REPAIR Right 2006  . SHOULDER ARTHROSCOPY WITH OPEN ROTATOR CUFF REPAIR Left 06/2012  . SPERMATOCELECTOMY    . TOTAL KNEE ARTHROPLASTY  11/16/2011   Procedure: TOTAL KNEE ARTHROPLASTY;  Surgeon: Kerin Salen, MD;  Location: [place];  Service: Orthopedics;  Laterality: Right;  right total knee arthroplasty  . TOTAL KNEE REVISION WITH SCAR DEBRIDEMENT/PATELLA REVISION WITH POLY EXCHANGE Right 12/03/2012   "not sure what they did exactly" (10/'13/2014)    Current Medications: No outpatient medications have been marked as taking for the 01/20/20 encounter (Appointment) with Richardo Priest, MD.     Allergies:   Patient has no known allergies.   Social History   Socioeconomic History  . Marital status: Divorced    Spouse name: Not on file  . Number of children: Not on file  . Years of education: Not on file  . Highest  education level: Not on file  Occupational History  . Not on file  Tobacco Use  . Smoking status: Never Smoker  . Smokeless tobacco: Current User    Types: Snuff, Chew  Vaping Use  . Vaping Use: Never used  Substance and Sexual Activity  . Alcohol use: Not Currently    Comment: 12/03/2012 "quit all alcohol in 1998; family hx of problems w/it"  . Drug use: No  . Sexual activity: Not Currently  Other Topics Concern  . Not on file  Social History Narrative  . Not on file   Social Determinants of Health   Financial Resource Strain:   . Difficulty of Paying  Living Expenses: Not on file  Food Insecurity:   . Worried About Charity fundraiser in the Last Year: Not on file  . Ran Out of Food in the Last Year: Not on file  Transportation Needs:   . Lack of Transportation (Medical): Not on file  . Lack of Transportation (Non-Medical): Not on file  Physical Activity:   . Days of Exercise per Week: Not on file  . Minutes of Exercise per Session: Not on file  Stress:   . Feeling of Stress : Not on file  Social Connections:   . Frequency of Communication with Friends and Family: Not on file  . Frequency of Social Gatherings with Friends and Family: Not on file  . Attends Religious Services: Not on file  . Active Member of Clubs or Organizations: Not on file  . Attends Archivist Meetings: Not on file  . Marital Status: Not on file     Family History: The patient's ***family history includes Alcohol abuse in his paternal aunt and paternal grandfather; Breast cancer in his cousin, maternal aunt, and mother; Cancer in his father and maternal aunt; Cancer (age of onset: 81) in his mother; Heart disease in his maternal grandfather, mother, and paternal grandfather; Hyperlipidemia in his brother and mother; Prostate cancer in his father. There is no history of Colon cancer, Colon polyps, Esophageal cancer, Rectal cancer, or Stomach cancer. ROS:   Please see the history of present illness.    All other systems reviewed and are negative.  EKGs/Labs/Other Studies Reviewed:    The following studies were reviewed today:  EKG:  EKG ordered today and personally reviewed.  The ekg ordered today demonstrates ***  Recent Labs: 06/11/2019: ALT 33; BUN 20; Creatinine, Ser 0.75; Hemoglobin 14.1; Platelets 234.0; Potassium 5.0; Sodium 139; TSH 1.54  Recent Lipid Panel    Component Value Date/Time   CHOL 134 06/11/2019 0832   TRIG 103.0 06/11/2019 0832   HDL 44.10 06/11/2019 0832   CHOLHDL 3 06/11/2019 0832   VLDL 20.6 06/11/2019 0832   LDLCALC 69  06/11/2019 0832   LDLDIRECT 110.0 03/20/2018 1206    Physical Exam:    VS:  There were no vitals taken for this visit.    Wt Readings from Last 3 Encounters:  06/27/19 274 lb (124.3 kg)  06/11/19 269 lb 12.8 oz (122.4 kg)  04/19/19 270 lb 9.6 oz (122.7 kg)     GEN: *** Well nourished, well developed in no acute distress HEENT: Normal NECK: No JVD; No carotid bruits LYMPHATICS: No lymphadenopathy CARDIAC: ***RRR, no murmurs, rubs, gallops RESPIRATORY:  Clear to auscultation without rales, wheezing or rhonchi  ABDOMEN: Soft, non-tender, non-distended MUSCULOSKELETAL:  No edema; No deformity  SKIN: Warm and dry NEUROLOGIC:  Alert and oriented x 3 PSYCHIATRIC:  Normal affect  Signed, Shirlee More, MD  01/19/2020 10:53 AM    [place]

## 2020-01-20 ENCOUNTER — Ambulatory Visit: Payer: Medicare PPO | Admitting: Cardiology

## 2020-01-29 DIAGNOSIS — F1120 Opioid dependence, uncomplicated: Secondary | ICD-10-CM | POA: Diagnosis not present

## 2020-01-29 DIAGNOSIS — Z79899 Other long term (current) drug therapy: Secondary | ICD-10-CM | POA: Diagnosis not present

## 2020-01-29 DIAGNOSIS — Z79891 Long term (current) use of opiate analgesic: Secondary | ICD-10-CM | POA: Diagnosis not present

## 2020-02-06 ENCOUNTER — Other Ambulatory Visit: Payer: Self-pay | Admitting: Family Medicine

## 2020-02-11 DIAGNOSIS — G4733 Obstructive sleep apnea (adult) (pediatric): Secondary | ICD-10-CM | POA: Diagnosis not present

## 2020-02-12 DIAGNOSIS — Z79899 Other long term (current) drug therapy: Secondary | ICD-10-CM | POA: Diagnosis not present

## 2020-02-12 DIAGNOSIS — Z79891 Long term (current) use of opiate analgesic: Secondary | ICD-10-CM | POA: Diagnosis not present

## 2020-02-12 DIAGNOSIS — F1120 Opioid dependence, uncomplicated: Secondary | ICD-10-CM | POA: Diagnosis not present

## 2020-02-17 ENCOUNTER — Telehealth: Payer: Self-pay | Admitting: Family Medicine

## 2020-02-17 NOTE — Telephone Encounter (Signed)
Left message for patient to call back and schedule Medicare Annual Wellness Visit (AWV) either virtually or in office.   Last AWV no information please schedule at anytime with [facility] Nurse Health Advisor 1 or 2   This should be a 45 minute visit. 

## 2020-03-04 DIAGNOSIS — Z79899 Other long term (current) drug therapy: Secondary | ICD-10-CM | POA: Diagnosis not present

## 2020-03-04 DIAGNOSIS — Z79891 Long term (current) use of opiate analgesic: Secondary | ICD-10-CM | POA: Diagnosis not present

## 2020-03-04 DIAGNOSIS — F1121 Opioid dependence, in remission: Secondary | ICD-10-CM | POA: Diagnosis not present

## 2020-03-04 DIAGNOSIS — F1120 Opioid dependence, uncomplicated: Secondary | ICD-10-CM | POA: Diagnosis not present

## 2020-03-13 DIAGNOSIS — G4733 Obstructive sleep apnea (adult) (pediatric): Secondary | ICD-10-CM | POA: Diagnosis not present

## 2020-03-19 ENCOUNTER — Other Ambulatory Visit: Payer: Self-pay

## 2020-03-19 ENCOUNTER — Ambulatory Visit: Payer: Medicare PPO | Admitting: Cardiology

## 2020-03-19 ENCOUNTER — Encounter: Payer: Self-pay | Admitting: Cardiology

## 2020-03-19 VITALS — BP 124/88 | HR 71 | Ht 78.0 in | Wt 266.8 lb

## 2020-03-19 DIAGNOSIS — I480 Paroxysmal atrial fibrillation: Secondary | ICD-10-CM | POA: Diagnosis not present

## 2020-03-19 DIAGNOSIS — I10 Essential (primary) hypertension: Secondary | ICD-10-CM

## 2020-03-19 DIAGNOSIS — Z7189 Other specified counseling: Secondary | ICD-10-CM

## 2020-03-19 DIAGNOSIS — G4733 Obstructive sleep apnea (adult) (pediatric): Secondary | ICD-10-CM

## 2020-03-19 NOTE — Progress Notes (Signed)
Cardiology Office Note:    Date:  03/19/2020   ID:  Jimmy Black, DOB [birth year], MRN [id number]  PCP:  Eulas Post, MD  Cardiologist:  Shirlee More, MD    Referring MD: Eulas Post, MD    ASSESSMENT:    1. Paroxysmal atrial fibrillation (HCC)   2. Essential hypertension   3. Obstructive sleep apnea   4. Educated about COVID-19 virus infection    PLAN:    In order of problems listed above:  1. Stable no recurrence of atrial fibrillation is not anticoagulated and I would not put him on anticoagulant at this time with a score of 1 and relatively low stroke risk without sustained atrial arrhythmia.  And strongly encouraged to follow through with ENT for consideration of surgical or pacemaker modalities.  He will continue low-dose aspirin 2. Stable BP is at target continue current treatment ARB plus MRA diuretic 3. Stable continue with statin   Next appointment: 1 year   Medication Adjustments/Labs and Tests Ordered: Current medicines are reviewed at length with the patient today.  Concerns regarding medicines are outlined above.  Orders Placed This Encounter  Procedures  . EKG 12-Lead   No orders of the defined types were placed in this encounter.   No chief complaint on file.   History of Present Illness:    Jimmy Black is a 59 y.o. male with a hx of paroxysmal atrial fibrillation hypertension hyperlipidemia and sleep apnea.  He was last seen 04/19/2019 and was awaiting a CPAP titration for treatment.  He had an echocardiogram performed 04/01/2019 suggesting mild enlargement of the ascending aorta however CT scan at the hospital showed the aorta to be normal in caliber.  He also had mild LVH and mild left atrial enlargement.. Compliance with diet, lifestyle and medications: Yes  He is not vaccinated we had a discussion and strongly encouraged him to accept mRNA vaccine. He is intolerant of CPAP and is scheduled to see ENT and is  interested in other modalities. He is done intermittent fasting he is at a mild weight loss he feels better he is sleeping better and has had no recurrence of his atrial fibrillation. He has had no chest pain edema shortness of breath palpitation or syncope. Past Medical History:  Diagnosis Date  . Alcohol abuse   . Anxiety    takes Paxil daily  . Atrial fibrillation ([place]) 11/16/2018  . Chicken pox   . Family history of anesthesia complication    brother got sick after anesthesia  . Hyperlipidemia    takes Fish Oil daily  . Hypertension 03/20/2018  . Joint pain   . Joint swelling   . Numbness    both leg and feet  . Obstructive sleep apnea 06/21/2015  . Osteoarthritis    "right knee; both hips" (12/03/2012)  . Palpitations 11/16/2018  . Peripheral neuropathy   . Sleep apnea    not started cpap as of 5-6 -2021 pv     Past Surgical History:  Procedure Laterality Date  . EXCISIONAL TOTAL KNEE ARTHROPLASTY Right 12/03/2012   Procedure: EXCISIONAL TOTAL KNEE ARTHROPLASTY (POLY SWAP), SCAR TISSUE REMOVAL, QUADRICEPSPLASTY;  Surgeon: Vickey Huger, MD;  Location: [place];  Service: Orthopedics;  Laterality: Right;  . INCISION AND DRAINAGE OF WOUND Right 2006   "w/jet lavage; had to do this twice after the OR" (12/03/2012)  . [place]   "benign tumor removal" (12/03/2012)  . SHOULDER ARTHROSCOPY WITH OPEN ROTATOR CUFF  REPAIR Right 2006  . SHOULDER ARTHROSCOPY WITH OPEN ROTATOR CUFF REPAIR Left 06/2012  . SPERMATOCELECTOMY    . TOTAL KNEE ARTHROPLASTY  11/16/2011   Procedure: TOTAL KNEE ARTHROPLASTY;  Surgeon: Kerin Salen, MD;  Location: [place];  Service: Orthopedics;  Laterality: Right;  right total knee arthroplasty  . TOTAL KNEE REVISION WITH SCAR DEBRIDEMENT/PATELLA REVISION WITH POLY EXCHANGE Right 12/03/2012   "not sure what they did exactly" (10/'13/2014)    Current Medications: Current Meds  Medication Sig  . aspirin EC 81 MG tablet Take 81 mg by mouth  daily.  . buprenorphine (SUBUTEX) 8 MG SUBL SL tablet   . eplerenone (INSPRA) 25 MG tablet Take 1 tablet (25 mg total) by mouth daily.  . Garlic [id number] MG TABS Take 1 tablet by mouth 3 (three) times daily.   Marland Kitchen ibuprofen (ADVIL,MOTRIN) 200 MG tablet Take 600 mg by mouth daily as needed for pain.  Marland Kitchen MAGNESIUM PO Take by mouth. 300-400 mg daily  . Multiple Vitamins-Minerals (MENS MULTI VITAMIN & MINERAL PO) Take 1 tablet by mouth daily.   . niacin 500 MG tablet Take 500 mg by mouth at bedtime.  . Omega-3 Fatty Acids (FISH OIL CONCENTRATE PO) Take 1 capsule by mouth 3 (three) times daily.   Marland Kitchen PARoxetine (PAXIL) 20 MG tablet TAKE 1 TABLET BY MOUTH EVERY DAY  . rosuvastatin (CRESTOR) 20 MG tablet TAKE 1 TABLET BY MOUTH EVERY DAY - CALL FOR LAB APPOINTMENT  . telmisartan (MICARDIS) 20 MG tablet Take 1 tablet (20 mg total) by mouth daily.     Allergies:   Patient has no known allergies.   Social History   Socioeconomic History  . Marital status: Divorced    Spouse name: Not on file  . Number of children: Not on file  . Years of education: Not on file  . Highest education level: Not on file  Occupational History  . Not on file  Tobacco Use  . Smoking status: Never Smoker  . Smokeless tobacco: Current User    Types: Snuff, Chew  Vaping Use  . Vaping Use: Never used  Substance and Sexual Activity  . Alcohol use: Not Currently    Comment: 12/03/2012 "quit all alcohol in 1998; family hx of problems w/it"  . Drug use: No  . Sexual activity: Not Currently  Other Topics Concern  . Not on file  Social History Narrative  . Not on file   Social Determinants of Health   Financial Resource Strain: Not on file  Food Insecurity: Not on file  Transportation Needs: Not on file  Physical Activity: Not on file  Stress: Not on file  Social Connections: Not on file     Family History: The patient's family history includes Alcohol abuse in his paternal aunt and paternal grandfather; Breast cancer  in his cousin, maternal aunt, and mother; Cancer in his father and maternal aunt; Cancer (age of onset: 44) in his mother; Heart disease in his maternal grandfather, mother, and paternal grandfather; Hyperlipidemia in his brother and mother; Prostate cancer in his father. There is no history of Colon cancer, Colon polyps, Esophageal cancer, Rectal cancer, or Stomach cancer. ROS:   Please see the history of present illness.    All other systems reviewed and are negative.  EKGs/Labs/Other Studies Reviewed:    The following studies were reviewed today:  EKG:  EKG ordered today and personally reviewed.  The ekg ordered today demonstrates sinus rhythm and is normal  Recent Labs: 06/11/2019: ALT  33; BUN 20; Creatinine, Ser 0.75; Hemoglobin 14.1; Platelets 234.0; Potassium 5.0; Sodium 139; TSH 1.54  Recent Lipid Panel    Component Value Date/Time   CHOL 134 06/11/2019 0832   TRIG 103.0 06/11/2019 0832   HDL 44.10 06/11/2019 0832   CHOLHDL 3 06/11/2019 0832   VLDL 20.6 06/11/2019 0832   LDLCALC 69 06/11/2019 0832   LDLDIRECT 110.0 03/20/2018 1206    Physical Exam:    VS:  BP 124/88   Pulse 71   Ht 6\' 6"  (1.981 m)   Wt 266 lb 12.8 oz (121 kg)   SpO2 97%   BMI 30.83 kg/m     Wt Readings from Last 3 Encounters:  03/19/20 266 lb 12.8 oz (121 kg)  06/27/19 274 lb (124.3 kg)  06/11/19 269 lb 12.8 oz (122.4 kg)     GEN:  Well nourished, well developed in no acute distress HEENT: Normal NECK: No JVD; No carotid bruits LYMPHATICS: No lymphadenopathy CARDIAC: RRR, no murmurs, rubs, gallops RESPIRATORY:  Clear to auscultation without rales, wheezing or rhonchi  ABDOMEN: Soft, non-tender, non-distended MUSCULOSKELETAL:  No edema; No deformity  SKIN: Warm and dry NEUROLOGIC:  Alert and oriented x 3 PSYCHIATRIC:  Normal affect    Signed, Shirlee More, MD  03/19/2020 8:50 AM    [place]

## 2020-03-19 NOTE — Patient Instructions (Signed)

## 2020-03-25 DIAGNOSIS — Z79891 Long term (current) use of opiate analgesic: Secondary | ICD-10-CM | POA: Diagnosis not present

## 2020-03-25 DIAGNOSIS — F1121 Opioid dependence, in remission: Secondary | ICD-10-CM | POA: Diagnosis not present

## 2020-03-25 DIAGNOSIS — F1120 Opioid dependence, uncomplicated: Secondary | ICD-10-CM | POA: Diagnosis not present

## 2020-03-25 DIAGNOSIS — Z79899 Other long term (current) drug therapy: Secondary | ICD-10-CM | POA: Diagnosis not present

## 2020-04-13 DIAGNOSIS — G4733 Obstructive sleep apnea (adult) (pediatric): Secondary | ICD-10-CM | POA: Diagnosis not present

## 2020-04-15 DIAGNOSIS — Z79899 Other long term (current) drug therapy: Secondary | ICD-10-CM | POA: Diagnosis not present

## 2020-04-15 DIAGNOSIS — Z79891 Long term (current) use of opiate analgesic: Secondary | ICD-10-CM | POA: Diagnosis not present

## 2020-04-15 DIAGNOSIS — F1120 Opioid dependence, uncomplicated: Secondary | ICD-10-CM | POA: Diagnosis not present

## 2020-04-15 DIAGNOSIS — F1121 Opioid dependence, in remission: Secondary | ICD-10-CM | POA: Diagnosis not present

## 2020-04-17 ENCOUNTER — Telehealth: Payer: Self-pay | Admitting: Family Medicine

## 2020-04-17 NOTE — Telephone Encounter (Signed)
Left message for patient to call back and schedule Medicare Annual Wellness Visit (AWV) either virtually or in office. No detailed message   AWVI  please schedule at anytime with [facility] Nurse Health Advisor 1 or 2   This should be a 45 minute visit. 

## 2020-05-06 ENCOUNTER — Other Ambulatory Visit: Payer: Self-pay | Admitting: Family Medicine

## 2020-05-11 ENCOUNTER — Other Ambulatory Visit: Payer: Self-pay | Admitting: Cardiology

## 2020-05-11 DIAGNOSIS — G4733 Obstructive sleep apnea (adult) (pediatric): Secondary | ICD-10-CM | POA: Diagnosis not present

## 2020-05-12 ENCOUNTER — Ambulatory Visit: Payer: Medicare PPO

## 2020-05-13 DIAGNOSIS — Z79899 Other long term (current) drug therapy: Secondary | ICD-10-CM | POA: Diagnosis not present

## 2020-05-13 DIAGNOSIS — F1120 Opioid dependence, uncomplicated: Secondary | ICD-10-CM | POA: Diagnosis not present

## 2020-05-13 DIAGNOSIS — Z79891 Long term (current) use of opiate analgesic: Secondary | ICD-10-CM | POA: Diagnosis not present

## 2020-05-13 DIAGNOSIS — F1121 Opioid dependence, in remission: Secondary | ICD-10-CM | POA: Diagnosis not present

## 2020-06-03 DIAGNOSIS — F1121 Opioid dependence, in remission: Secondary | ICD-10-CM | POA: Diagnosis not present

## 2020-06-03 DIAGNOSIS — Z79891 Long term (current) use of opiate analgesic: Secondary | ICD-10-CM | POA: Diagnosis not present

## 2020-06-03 DIAGNOSIS — Z79899 Other long term (current) drug therapy: Secondary | ICD-10-CM | POA: Diagnosis not present

## 2020-06-11 DIAGNOSIS — G4733 Obstructive sleep apnea (adult) (pediatric): Secondary | ICD-10-CM | POA: Diagnosis not present

## 2020-06-24 DIAGNOSIS — F1120 Opioid dependence, uncomplicated: Secondary | ICD-10-CM | POA: Diagnosis not present

## 2020-06-24 DIAGNOSIS — F1121 Opioid dependence, in remission: Secondary | ICD-10-CM | POA: Diagnosis not present

## 2020-06-24 DIAGNOSIS — Z79899 Other long term (current) drug therapy: Secondary | ICD-10-CM | POA: Diagnosis not present

## 2020-06-24 DIAGNOSIS — Z79891 Long term (current) use of opiate analgesic: Secondary | ICD-10-CM | POA: Diagnosis not present

## 2020-07-08 DIAGNOSIS — Z79899 Other long term (current) drug therapy: Secondary | ICD-10-CM | POA: Diagnosis not present

## 2020-07-08 DIAGNOSIS — F1121 Opioid dependence, in remission: Secondary | ICD-10-CM | POA: Diagnosis not present

## 2020-07-08 DIAGNOSIS — Z79891 Long term (current) use of opiate analgesic: Secondary | ICD-10-CM | POA: Diagnosis not present

## 2020-07-11 DIAGNOSIS — G4733 Obstructive sleep apnea (adult) (pediatric): Secondary | ICD-10-CM | POA: Diagnosis not present

## 2020-07-16 ENCOUNTER — Telehealth: Payer: Self-pay | Admitting: Family Medicine

## 2020-07-16 NOTE — Telephone Encounter (Signed)
Left message for patient to call back and schedule Medicare Annual Wellness Visit (AWV) either virtually or in office.   AWV-I per [place] 08/21/12  please schedule at anytime with [facility] Nurse Health Advisor 1 or 2   This should be a 45 minute visit.

## 2020-07-29 DIAGNOSIS — Z79891 Long term (current) use of opiate analgesic: Secondary | ICD-10-CM | POA: Diagnosis not present

## 2020-07-31 ENCOUNTER — Other Ambulatory Visit: Payer: Self-pay | Admitting: Family Medicine

## 2020-08-03 MED ORDER — ROSUVASTATIN CALCIUM 20 MG PO TABS: ORAL_TABLET | ORAL | 0 refills | Status: DC

## 2020-08-03 NOTE — Telephone Encounter (Signed)
ATC, unable to leave a message. Rx has been sent in along with a note saying for the patient to contact our office.

## 2020-08-14 ENCOUNTER — Encounter: Payer: Self-pay | Admitting: Family Medicine

## 2020-08-17 ENCOUNTER — Encounter: Payer: Medicare PPO | Admitting: Family Medicine

## 2020-08-25 DIAGNOSIS — M25552 Pain in left hip: Secondary | ICD-10-CM | POA: Diagnosis not present

## 2020-08-25 DIAGNOSIS — G629 Polyneuropathy, unspecified: Secondary | ICD-10-CM | POA: Diagnosis not present

## 2020-08-25 DIAGNOSIS — M79673 Pain in unspecified foot: Secondary | ICD-10-CM | POA: Diagnosis not present

## 2020-08-25 DIAGNOSIS — G894 Chronic pain syndrome: Secondary | ICD-10-CM | POA: Diagnosis not present

## 2020-08-26 ENCOUNTER — Other Ambulatory Visit: Payer: Self-pay | Admitting: Family Medicine

## 2020-08-26 ENCOUNTER — Encounter: Payer: Self-pay | Admitting: Family Medicine

## 2020-08-26 MED ORDER — PAROXETINE HCL 20 MG PO TABS: 20.0000 mg | ORAL_TABLET | Freq: Every day | ORAL | 0 refills | Status: DC

## 2020-08-28 ENCOUNTER — Telehealth: Payer: Self-pay | Admitting: Family Medicine

## 2020-08-28 ENCOUNTER — Encounter: Payer: Self-pay | Admitting: Family Medicine

## 2020-08-28 MED ORDER — ROSUVASTATIN CALCIUM 20 MG PO TABS: ORAL_TABLET | ORAL | 0 refills | Status: DC

## 2020-08-28 NOTE — Telephone Encounter (Signed)
Left message for patient to call back and schedule Medicare Annual Wellness Visit (AWV) either virtually or in office.    AWV-I per [place] 08/21/12 please schedule at anytime with [facility] Nurse Health Advisor 1 or 2   This should be a 45 minute visit.

## 2020-08-28 NOTE — Telephone Encounter (Signed)
Pt call and stated he don't want a appt at this time.

## 2020-08-31 NOTE — Telephone Encounter (Signed)
Documented on spreadsheet 

## 2020-09-18 ENCOUNTER — Other Ambulatory Visit: Payer: Self-pay | Admitting: Family Medicine

## 2020-09-22 ENCOUNTER — Encounter: Payer: Medicare PPO | Admitting: Family Medicine

## 2020-09-23 DIAGNOSIS — M79673 Pain in unspecified foot: Secondary | ICD-10-CM | POA: Diagnosis not present

## 2020-09-23 DIAGNOSIS — G629 Polyneuropathy, unspecified: Secondary | ICD-10-CM | POA: Diagnosis not present

## 2020-09-23 DIAGNOSIS — M79604 Pain in right leg: Secondary | ICD-10-CM | POA: Diagnosis not present

## 2020-09-23 DIAGNOSIS — G894 Chronic pain syndrome: Secondary | ICD-10-CM | POA: Diagnosis not present

## 2020-09-28 ENCOUNTER — Encounter: Payer: Self-pay | Admitting: Family Medicine

## 2020-09-28 ENCOUNTER — Ambulatory Visit (INDEPENDENT_AMBULATORY_CARE_PROVIDER_SITE_OTHER): Payer: Medicare PPO | Admitting: Family Medicine

## 2020-09-28 ENCOUNTER — Other Ambulatory Visit: Payer: Self-pay

## 2020-09-28 ENCOUNTER — Other Ambulatory Visit: Payer: Self-pay | Admitting: Family Medicine

## 2020-09-28 VITALS — BP 132/70 | HR 70 | Temp 98.4°F | Ht 78.0 in | Wt 257.3 lb

## 2020-09-28 DIAGNOSIS — Z1211 Encounter for screening for malignant neoplasm of colon: Secondary | ICD-10-CM | POA: Diagnosis not present

## 2020-09-28 DIAGNOSIS — D229 Melanocytic nevi, unspecified: Secondary | ICD-10-CM

## 2020-09-28 DIAGNOSIS — Z0000 Encounter for general adult medical examination without abnormal findings: Secondary | ICD-10-CM | POA: Diagnosis not present

## 2020-09-28 LAB — CBC WITH DIFFERENTIAL/PLATELET
Basophils Absolute: 0 10*3/uL (ref 0.0–0.1)
Basophils Relative: 0.8 % (ref 0.0–3.0)
Eosinophils Absolute: 0.1 10*3/uL (ref 0.0–0.7)
Eosinophils Relative: 2.3 % (ref 0.0–5.0)
HCT: 40.3 % (ref 39.0–52.0)
Hemoglobin: 13.7 g/dL (ref 13.0–17.0)
Lymphocytes Relative: 29 % (ref 12.0–46.0)
Lymphs Abs: 1.7 10*3/uL (ref 0.7–4.0)
MCHC: 34 g/dL (ref 30.0–36.0)
MCV: 87.9 fl (ref 78.0–100.0)
Monocytes Absolute: 0.5 10*3/uL (ref 0.1–1.0)
Monocytes Relative: 9.2 % (ref 3.0–12.0)
Neutro Abs: 3.3 10*3/uL (ref 1.4–7.7)
Neutrophils Relative %: 58.7 % (ref 43.0–77.0)
Platelets: 234 10*3/uL (ref 150.0–400.0)
RBC: 4.59 Mil/uL (ref 4.22–5.81)
RDW: 13.1 % (ref 11.5–15.5)
WBC: 5.7 10*3/uL (ref 4.0–10.5)

## 2020-09-28 LAB — BASIC METABOLIC PANEL
BUN: 19 mg/dL (ref 6–23)
CO2: 25 mEq/L (ref 19–32)
Calcium: 9.6 mg/dL (ref 8.4–10.5)
Chloride: 103 mEq/L (ref 96–112)
Creatinine, Ser: 0.77 mg/dL (ref 0.40–1.50)
GFR: 99.06 mL/min (ref >60.00)
Glucose, Bld: 88 mg/dL (ref 70–99)
Potassium: 4.2 mEq/L (ref 3.5–5.1)
Sodium: 137 mEq/L (ref 135–145)

## 2020-09-28 LAB — HEPATIC FUNCTION PANEL
ALT: 24 U/L (ref 0–53)
AST: 24 U/L (ref 0–37)
Albumin: 4.6 g/dL (ref 3.5–5.2)
Alkaline Phosphatase: 70 U/L (ref 39–117)
Bilirubin, Direct: 0.2 mg/dL (ref 0.0–0.3)
Total Bilirubin: 0.8 mg/dL (ref 0.2–1.2)
Total Protein: 6.9 g/dL (ref 6.0–8.3)

## 2020-09-28 LAB — LIPID PANEL
Cholesterol: 124 mg/dL (ref 0–200)
HDL: 39.3 mg/dL (ref >39.00)
LDL Cholesterol: 60 mg/dL (ref 0–99)
NonHDL: 84.95
Total CHOL/HDL Ratio: 3
Triglycerides: 123 mg/dL (ref 0.0–149.0)
VLDL: 24.6 mg/dL (ref 0.0–40.0)

## 2020-09-28 LAB — TSH: TSH: 1.45 u[IU]/mL (ref 0.35–5.50)

## 2020-09-28 LAB — PSA: PSA: 0.56 ng/mL (ref 0.10–4.00)

## 2020-09-28 MED ORDER — PAROXETINE HCL 20 MG PO TABS: 20.0000 mg | ORAL_TABLET | Freq: Every day | ORAL | 0 refills | Status: DC

## 2020-09-28 NOTE — Addendum Note (Signed)
Addended by: Elmer Picker on: 09/28/2020 08:36 AM   Modules accepted: Orders

## 2020-09-28 NOTE — Progress Notes (Signed)
Established Patient Office Visit  Subjective:  Patient ID: Jimmy Black, male    DOB: [birth year]  Age: 60 y.o. MRN: [id number]  CC:  Chief Complaint  Patient presents with   Annual Exam    No new concerns    HPI CALTON ARCIDIACONO presents for physical exam.  He has medical problems including history of hypertension, atrial fibrillation, obstructive sleep apnea, peripheral neuropathy, osteoarthritis with multiple prior orthopedic surgeries, remote history of alcohol abuse, hyperlipidemia.  He is followed by cardiology.  Also followed by pain management and takes regular buprenorphine which has made a huge difference in controlling his pain and giving him better quality of life.  Health maintenance reviewed  -Previous hepatitis C screen negative -No history of Shingrix vaccine -Never had colonoscopy but is willing to schedule -Tetanus up-to-date -He declined COVID vaccines  Family history-mother had breast cancer.  Father had prostate cancer and died of complications of CAD at age 48.  He has 2 brothers.  One with hyperlipidemia and hypertension.  Social history-married.  He has one 61 year old daughter.  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  Past remote history of alcohol abuse.  Non-smoker.  Exercises regularly several days per week with combination of resistance training and aerobic  Past Medical History:  Diagnosis Date   Alcohol abuse    Anxiety    takes Paxil daily   Atrial fibrillation ([facility]) 11/16/2018   Chicken pox    Family history of anesthesia complication    brother got sick after anesthesia   Hyperlipidemia    takes Fish Oil daily   Hypertension 03/20/2018   Joint pain    Joint swelling    Numbness    both leg and feet   Obstructive sleep apnea 06/21/2015   Osteoarthritis    "right knee; both hips" (12/03/2012)   Palpitations 11/16/2018   Peripheral neuropathy    Sleep apnea     not started cpap as of 5-6 -2021 pv     Past Surgical History:  Procedure Laterality Date   EXCISIONAL TOTAL KNEE ARTHROPLASTY Right 12/03/2012   Procedure: EXCISIONAL TOTAL KNEE ARTHROPLASTY (POLY SWAP), SCAR TISSUE REMOVAL, QUADRICEPSPLASTY;  Surgeon: Vickey Huger, MD;  Location: [facility];  Service: Orthopedics;  Laterality: Right;   INCISION AND DRAINAGE OF WOUND Right 2006   "w/jet lavage; had to do this twice after the OR" (12/03/2012)   [place]   "benign tumor removal" (12/03/2012)   SHOULDER ARTHROSCOPY WITH OPEN ROTATOR CUFF REPAIR Right 2006   SHOULDER ARTHROSCOPY WITH OPEN ROTATOR CUFF REPAIR Left 06/2012   SPERMATOCELECTOMY     TOTAL KNEE ARTHROPLASTY  11/16/2011   Procedure: TOTAL KNEE ARTHROPLASTY;  Surgeon: Kerin Salen, MD;  Location: [facility];  Service: Orthopedics;  Laterality: Right;  right total knee arthroplasty   TOTAL KNEE REVISION WITH SCAR DEBRIDEMENT/PATELLA REVISION WITH POLY EXCHANGE Right 12/03/2012   "not sure what they did exactly" (10/'13/2014)    Family History  Problem Relation Age of Onset   Cancer Mother 63       breast   Hyperlipidemia Mother    Breast cancer Mother    Heart disease Father    Cancer Father        prostate   Prostate cancer Father    Hyperlipidemia Brother    Cancer Maternal Aunt        breast   Breast cancer Maternal Aunt  Alcohol abuse Paternal Aunt    Heart disease Maternal Grandfather    Alcohol abuse Paternal Grandfather    Heart disease Paternal Grandfather    Breast cancer Cousin    Colon cancer Neg Hx    Colon polyps Neg Hx    Esophageal cancer Neg Hx    Rectal cancer Neg Hx    Stomach cancer Neg Hx     Social History   Socioeconomic History   Marital status: Divorced    Spouse name: Not on file   Number of children: Not on file   Years of education: Not on file   Highest education level: Not on file  Occupational History   Not on file  Tobacco Use   Smoking status: Never    Smokeless tobacco: Current    Types: Snuff, Chew  Vaping Use   Vaping Use: Never used  Substance and Sexual Activity   Alcohol use: Not Currently    Comment: 12/03/2012 "quit all alcohol in 1998; family hx of problems w/it"   Drug use: No   Sexual activity: Not Currently  Other Topics Concern   Not on file  Social History Narrative   Not on file   Social Determinants of Health   Financial Resource Strain: Not on file  Food Insecurity: Not on file  Transportation Needs: Not on file  Physical Activity: Not on file  Stress: Not on file  Social Connections: Not on file  Intimate Partner Violence: Not on file    Outpatient Medications Prior to Visit  Medication Sig Dispense Refill   aspirin EC 81 MG tablet Take 81 mg by mouth daily.     buprenorphine (SUBUTEX) 8 MG SUBL SL tablet      Garlic 123XX123 MG TABS Take 1 tablet by mouth 3 (three) times daily.      ibuprofen (ADVIL,MOTRIN) 200 MG tablet Take 600 mg by mouth daily as needed for pain.     MAGNESIUM PO Take by mouth. 300-400 mg daily     Multiple Vitamins-Minerals (MENS MULTI VITAMIN & MINERAL PO) Take 1 tablet by mouth daily.      niacin 500 MG tablet Take 500 mg by mouth at bedtime.     Omega-3 Fatty Acids (FISH OIL CONCENTRATE PO) Take 1 capsule by mouth 3 (three) times daily.      PARoxetine (PAXIL) 20 MG tablet TAKE 1 TABLET BY MOUTH EVERY DAY 30 tablet 0   rosuvastatin (CRESTOR) 20 MG tablet Take one tablet by mouth once a day. PHYSICAL NEEDED FOR FURTHER REFILLS. 90 tablet 0   telmisartan (MICARDIS) 20 MG tablet Take 1 tablet (20 mg total) by mouth daily. 180 tablet 1   eplerenone (INSPRA) 25 MG tablet TAKE 1 TABLET BY MOUTH EVERY DAY 90 tablet 3   No facility-administered medications prior to visit.    No Known Allergies  ROS Review of Systems  Constitutional:  Negative for activity change, appetite change, fatigue and fever.  HENT:  Negative for congestion, ear pain and trouble swallowing.   Eyes:  Negative for  pain and visual disturbance.  Respiratory:  Negative for cough, shortness of breath and wheezing.   Cardiovascular:  Negative for chest pain and palpitations.  Gastrointestinal:  Negative for abdominal distention, abdominal pain, blood in stool, constipation, diarrhea, nausea, rectal pain and vomiting.  Genitourinary:  Negative for dysuria, hematuria and testicular pain.  Musculoskeletal:  Positive for arthralgias. Negative for joint swelling.  Skin:  Negative for rash.  Neurological:  Negative for dizziness,  syncope and headaches.  Hematological:  Negative for adenopathy.  Psychiatric/Behavioral:  Negative for confusion and dysphoric mood.      Objective:    Physical Exam Constitutional:      General: He is not in acute distress.    Appearance: He is well-developed.  HENT:     Head: Normocephalic and atraumatic.     Right Ear: External ear normal.     Left Ear: External ear normal.  Eyes:     Conjunctiva/sclera: Conjunctivae normal.     Pupils: Pupils are equal, round, and reactive to light.  Neck:     Thyroid: No thyromegaly.  Cardiovascular:     Rate and Rhythm: Normal rate and regular rhythm.     Heart sounds: Normal heart sounds. No murmur heard. Pulmonary:     Effort: No respiratory distress.     Breath sounds: No wheezing or rales.  Abdominal:     General: Bowel sounds are normal. There is no distension.     Palpations: Abdomen is soft. There is no mass.     Tenderness: There is no abdominal tenderness. There is no guarding or rebound.  Musculoskeletal:     Cervical back: Normal range of motion and neck supple.  Lymphadenopathy:     Cervical: No cervical adenopathy.  Skin:    Findings: No rash.     Comments: Multiple nevi on his trunk  Neurological:     Mental Status: He is alert and oriented to person, place, and time.     Cranial Nerves: No cranial nerve deficit.     Deep Tendon Reflexes: Reflexes normal.    BP 132/70 (BP Location: Left Arm, Patient  Position: Sitting, Cuff Size: Normal)   Pulse 70   Temp 98.4 F (36.9 C) (Oral)   Ht '6\' 6"'$  (1.981 m)   Wt 257 lb 4.8 oz (116.7 kg)   SpO2 98%   BMI 29.73 kg/m  Wt Readings from Last 3 Encounters:  09/28/20 257 lb 4.8 oz (116.7 kg)  03/19/20 266 lb 12.8 oz (121 kg)  06/27/19 274 lb (124.3 kg)     Health Maintenance Due  Topic Date Due   COLONOSCOPY (Pts 45-44yr Insurance coverage will need to be confirmed)  Never done   Zoster Vaccines- Shingrix (1 of 2) Never done   COLON CANCER SCREENING ANNUAL FOBT  07/27/2017   INFLUENZA VACCINE  09/21/2020    There are no preventive care reminders to display for this patient.  Lab Results  Component Value Date   TSH 1.54 06/11/2019   Lab Results  Component Value Date   WBC 7.8 06/11/2019   HGB 14.1 06/11/2019   HCT 41.0 06/11/2019   MCV 89.5 06/11/2019   PLT 234.0 06/11/2019   Lab Results  Component Value Date   NA 139 06/11/2019   K 5.0 06/11/2019   CO2 29 06/11/2019   GLUCOSE 92 06/11/2019   BUN 20 06/11/2019   CREATININE 0.75 06/11/2019   BILITOT 0.8 06/11/2019   ALKPHOS 60 06/11/2019   AST 31 06/11/2019   ALT 33 06/11/2019   PROT 7.2 06/11/2019   ALBUMIN 4.8 06/11/2019   CALCIUM 10.1 06/11/2019   ANIONGAP 11 12/23/2016   GFR 107.47 06/11/2019   Lab Results  Component Value Date   CHOL 134 06/11/2019   Lab Results  Component Value Date   HDL 44.10 06/11/2019   Lab Results  Component Value Date   LDLCALC 69 06/11/2019   Lab Results  Component Value Date  TRIG 103.0 06/11/2019   Lab Results  Component Value Date   CHOLHDL 3 06/11/2019   Lab Results  Component Value Date   HGBA1C 5.5 09/18/2015      Assessment & Plan:   Problem List Items Addressed This Visit   None Visit Diagnoses     Colon cancer screening    -  Primary   Relevant Orders   Ambulatory referral to Gastroenterology   Physical exam       Relevant Orders   Basic metabolic panel   Lipid panel   CBC with  Differential/Platelet   TSH   Hepatic function panel   PSA   Multiple nevi       Relevant Orders   Ambulatory referral to Dermatology     He has done an excellent job this past year with intentional weight loss and is exercising regularly.  Feels well overall.  We discussed several health maintenance issues as follows  -Obtain screening lab work as above -Discussed COVID-vaccine and he declines -We discussed coronary calcium scan to further risk stratify with his family history of CAD and he is interested.  He is already on statin and lipids have been well controlled in the past. -Set up screening colonoscopy -Discussed Shingrix vaccine and he will check on insurance coverage -Set up dermatology referral for good head to toe skin cancer screen. -Continue regular exercise habits  No orders of the defined types were placed in this encounter.   Follow-up: No follow-ups on file.    Carolann Littler, MD

## 2020-09-28 NOTE — Patient Instructions (Signed)
Consider Shingrix vaccine and check with insurance on coverage.

## 2020-09-30 ENCOUNTER — Other Ambulatory Visit: Payer: Self-pay | Admitting: Cardiology

## 2020-10-12 DIAGNOSIS — D225 Melanocytic nevi of trunk: Secondary | ICD-10-CM | POA: Diagnosis not present

## 2020-10-12 DIAGNOSIS — L821 Other seborrheic keratosis: Secondary | ICD-10-CM | POA: Diagnosis not present

## 2020-10-12 DIAGNOSIS — L905 Scar conditions and fibrosis of skin: Secondary | ICD-10-CM | POA: Diagnosis not present

## 2020-10-12 DIAGNOSIS — L814 Other melanin hyperpigmentation: Secondary | ICD-10-CM | POA: Diagnosis not present

## 2020-10-16 ENCOUNTER — Inpatient Hospital Stay: Admission: RE | Admit: 2020-10-16 | Payer: Self-pay | Source: Ambulatory Visit

## 2020-10-20 ENCOUNTER — Encounter: Payer: Self-pay | Admitting: Family Medicine

## 2020-10-21 DIAGNOSIS — G629 Polyneuropathy, unspecified: Secondary | ICD-10-CM | POA: Diagnosis not present

## 2020-10-21 DIAGNOSIS — M1612 Unilateral primary osteoarthritis, left hip: Secondary | ICD-10-CM | POA: Diagnosis not present

## 2020-10-21 DIAGNOSIS — M79604 Pain in right leg: Secondary | ICD-10-CM | POA: Diagnosis not present

## 2020-10-21 DIAGNOSIS — G894 Chronic pain syndrome: Secondary | ICD-10-CM | POA: Diagnosis not present

## 2020-11-02 ENCOUNTER — Telehealth: Payer: Self-pay | Admitting: Family Medicine

## 2020-11-02 NOTE — Telephone Encounter (Signed)
Left message for patient to call back and schedule Medicare Annual Wellness Visit (AWV) either virtually or in office. Left  my jabber number [phone]   AWV-I per [place] 08/21/12  please schedule at anytime with [facility] Nurse Health Advisor 1 or 2   This should be a 45 minute visit.  

## 2020-11-10 ENCOUNTER — Telehealth: Payer: Self-pay | Admitting: *Deleted

## 2020-11-10 NOTE — Telephone Encounter (Signed)
Pt cancelled AWv in march and is not interested in rescheduling, had CPE already

## 2020-11-20 ENCOUNTER — Other Ambulatory Visit: Payer: Self-pay | Admitting: Family Medicine

## 2020-12-16 DIAGNOSIS — M79604 Pain in right leg: Secondary | ICD-10-CM | POA: Diagnosis not present

## 2020-12-16 DIAGNOSIS — Z79891 Long term (current) use of opiate analgesic: Secondary | ICD-10-CM | POA: Diagnosis not present

## 2020-12-16 DIAGNOSIS — M1612 Unilateral primary osteoarthritis, left hip: Secondary | ICD-10-CM | POA: Diagnosis not present

## 2020-12-16 DIAGNOSIS — G894 Chronic pain syndrome: Secondary | ICD-10-CM | POA: Diagnosis not present

## 2020-12-16 DIAGNOSIS — M79673 Pain in unspecified foot: Secondary | ICD-10-CM | POA: Diagnosis not present

## 2020-12-25 ENCOUNTER — Other Ambulatory Visit: Payer: Self-pay | Admitting: Cardiology

## 2021-01-13 DIAGNOSIS — G629 Polyneuropathy, unspecified: Secondary | ICD-10-CM | POA: Diagnosis not present

## 2021-01-13 DIAGNOSIS — M79673 Pain in unspecified foot: Secondary | ICD-10-CM | POA: Diagnosis not present

## 2021-01-13 DIAGNOSIS — G894 Chronic pain syndrome: Secondary | ICD-10-CM | POA: Diagnosis not present

## 2021-01-13 DIAGNOSIS — M79604 Pain in right leg: Secondary | ICD-10-CM | POA: Diagnosis not present

## 2021-01-15 IMAGING — CR DG KNEE COMPLETE 4+V*R*
4 series · 4 of 4 positions shown · non-contrast
Comparison: None.

CLINICAL DATA: Right knee pain, chronic for 1 year with limited
range of motion. History of right knee arthroplasty in 3633. No
reported injury.

EXAM:
RIGHT KNEE - COMPLETE 4+ VIEW

[w knee ap right]
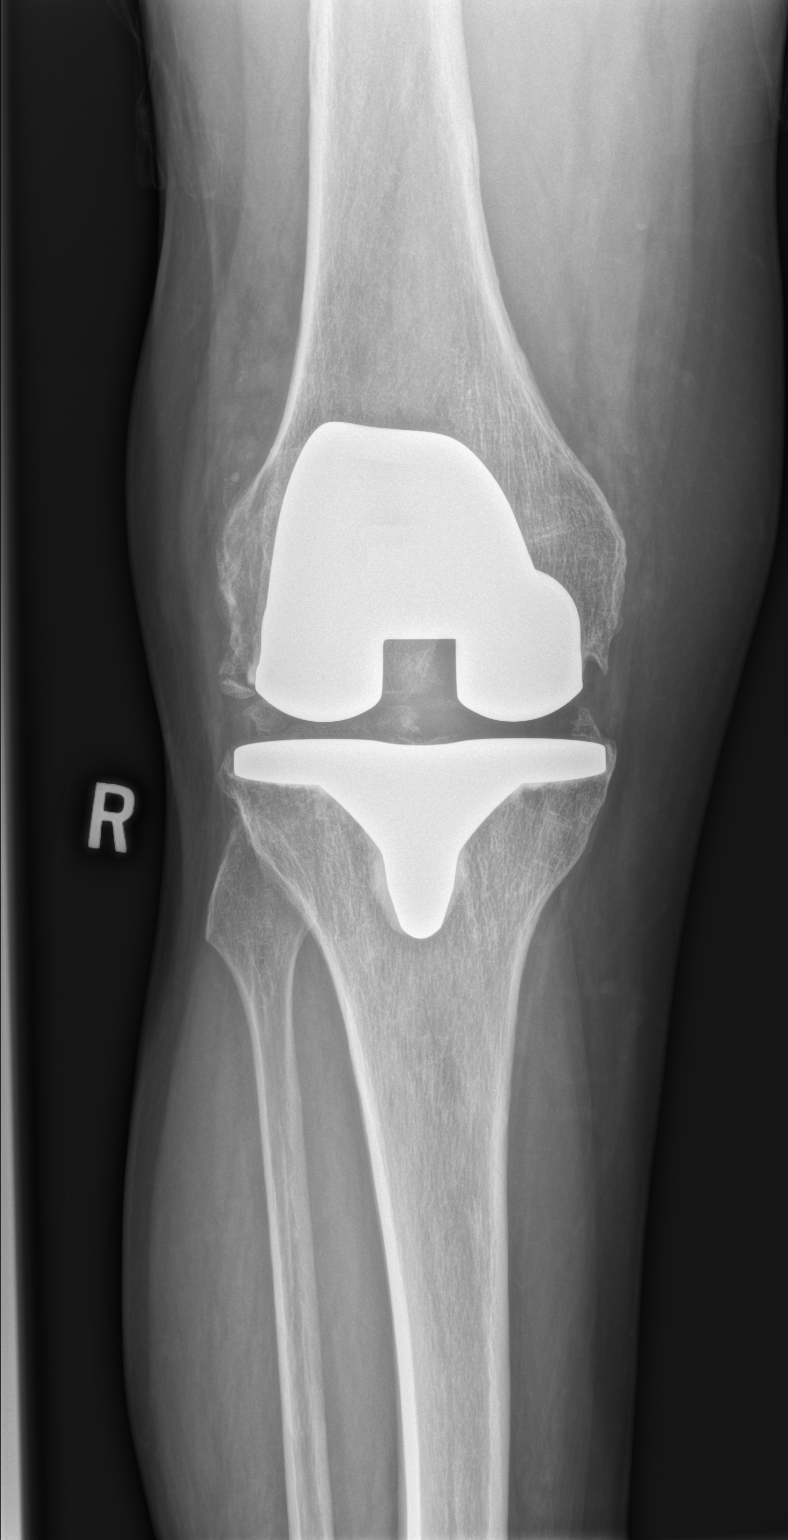

[w knee lat right]
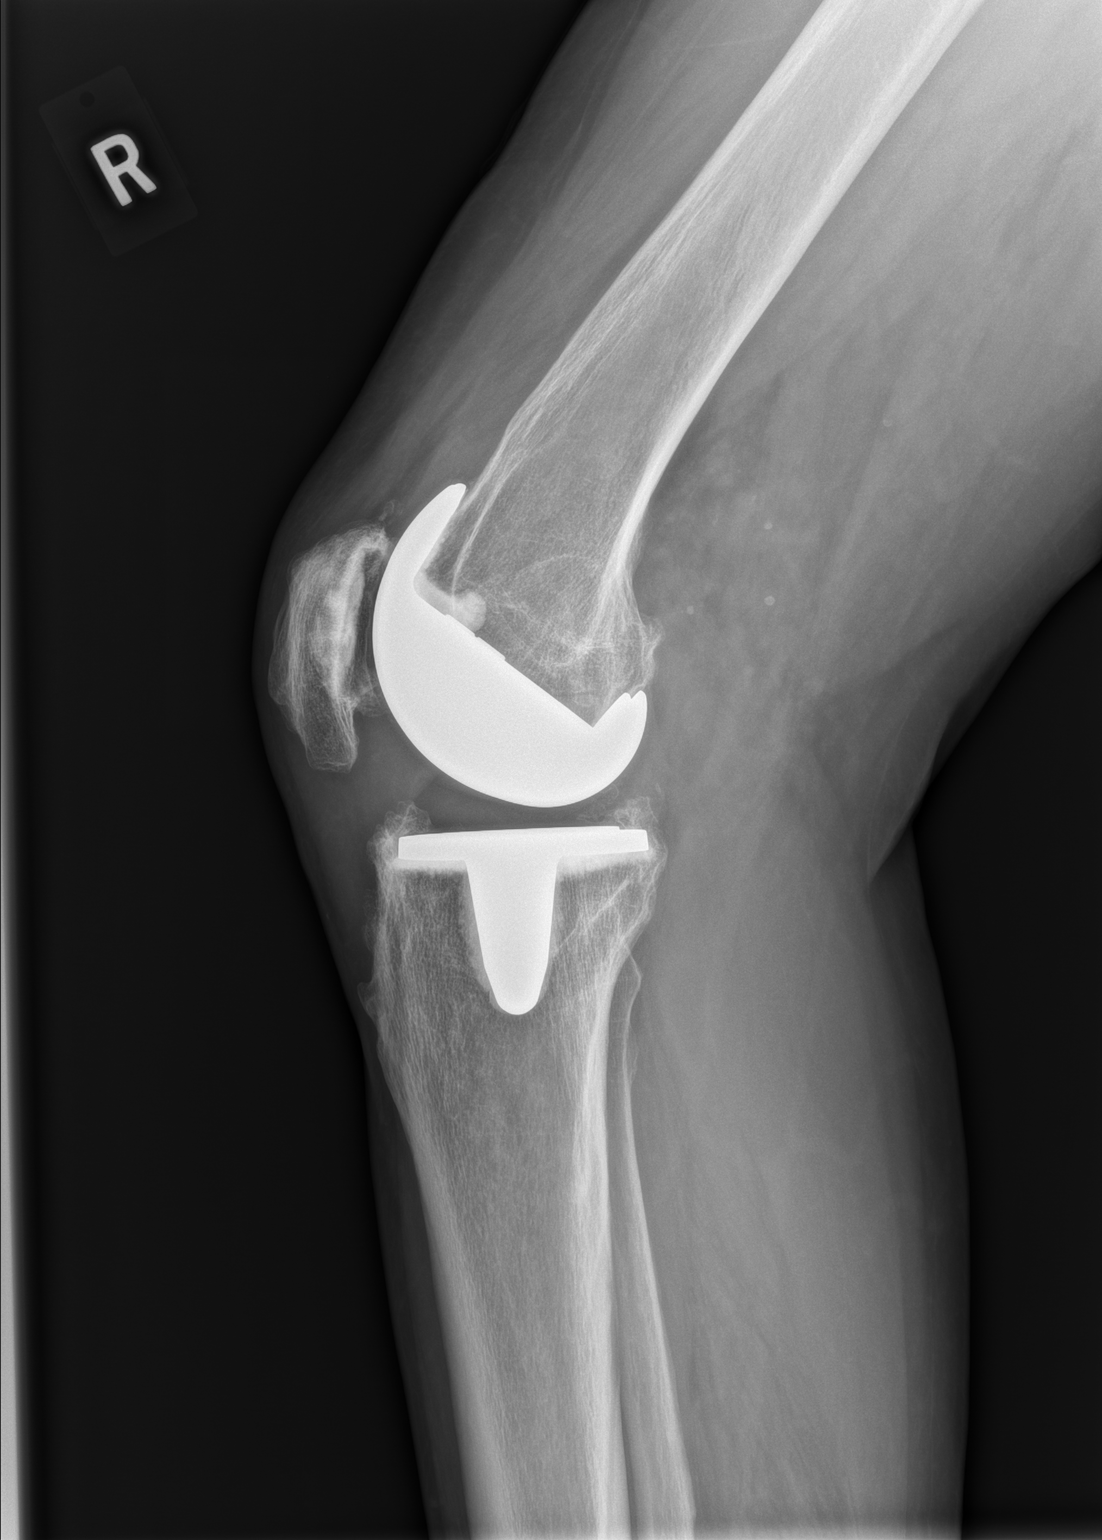

[w knee tunnel pa right]
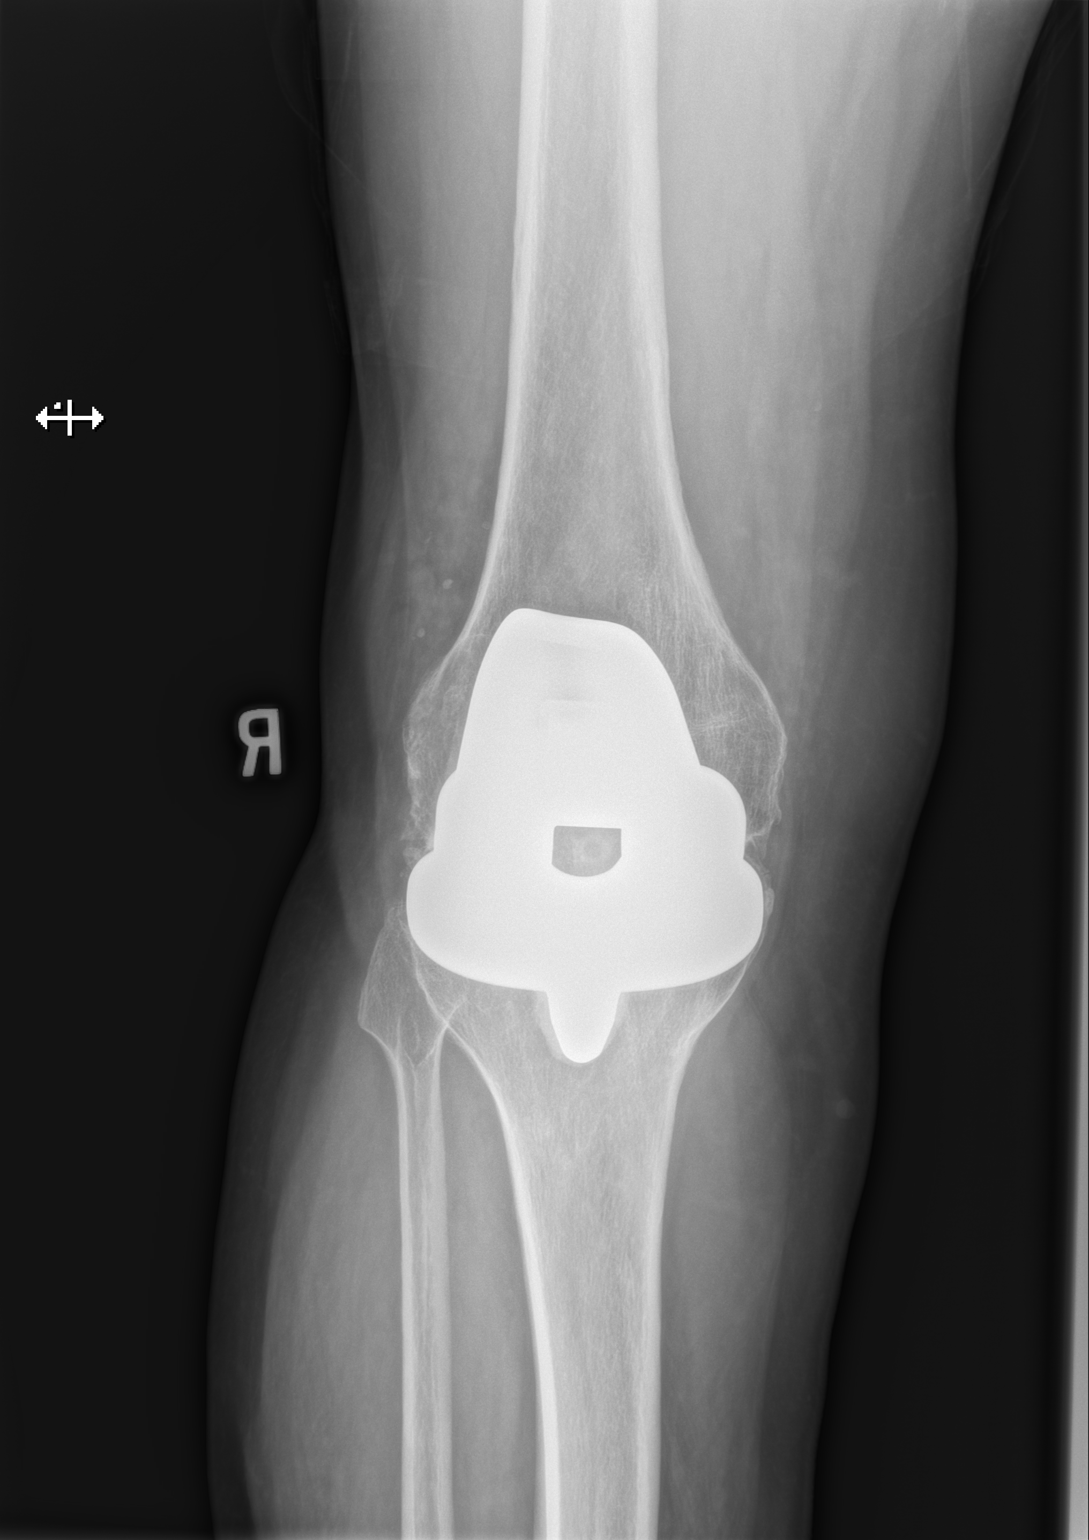

[x knee sunrise right]
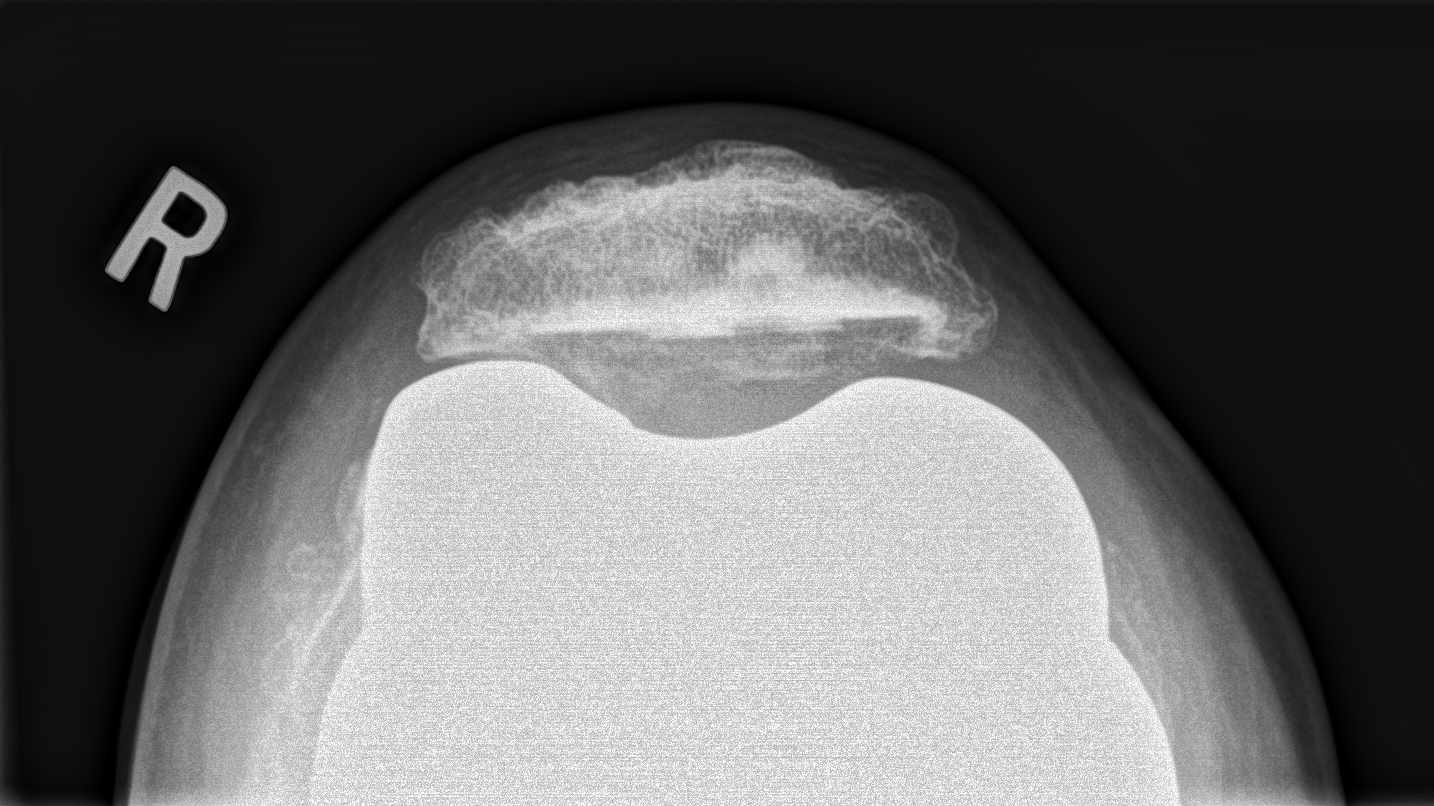

[4 of 4 positions shown; findings below may reference images not displayed]

FINDINGS: Status post right total knee arthroplasty with no evidence of
hardware fracture or loosening. No osseous fracture, dislocation,
significant joint effusion or suspicious focal osseous lesion.
IMPRESSION: Right total knee arthroplasty, with no evidence of hardware
complication. No acute osseous abnormality.

## 2021-01-25 ENCOUNTER — Telehealth: Payer: Self-pay | Admitting: Family Medicine

## 2021-01-25 NOTE — Telephone Encounter (Signed)
Left message for patient to call back and schedule Medicare Annual Wellness Visit (AWV) either virtually or in office. Left  my jabber number [phone]   AWV-I per [place] 08/21/12  please schedule at anytime with [facility] Nurse Health Advisor 1 or 2   This should be a 45 minute visit.  

## 2021-02-10 DIAGNOSIS — M1612 Unilateral primary osteoarthritis, left hip: Secondary | ICD-10-CM | POA: Diagnosis not present

## 2021-02-10 DIAGNOSIS — G629 Polyneuropathy, unspecified: Secondary | ICD-10-CM | POA: Diagnosis not present

## 2021-02-10 DIAGNOSIS — G894 Chronic pain syndrome: Secondary | ICD-10-CM | POA: Diagnosis not present

## 2021-02-10 DIAGNOSIS — M25561 Pain in right knee: Secondary | ICD-10-CM | POA: Diagnosis not present

## 2021-02-20 ENCOUNTER — Other Ambulatory Visit: Payer: Self-pay | Admitting: Family Medicine

## 2021-02-26 ENCOUNTER — Other Ambulatory Visit: Payer: Self-pay | Admitting: Family Medicine

## 2021-03-16 DIAGNOSIS — G629 Polyneuropathy, unspecified: Secondary | ICD-10-CM | POA: Diagnosis not present

## 2021-03-16 DIAGNOSIS — M1612 Unilateral primary osteoarthritis, left hip: Secondary | ICD-10-CM | POA: Diagnosis not present

## 2021-03-16 DIAGNOSIS — G894 Chronic pain syndrome: Secondary | ICD-10-CM | POA: Diagnosis not present

## 2021-03-16 DIAGNOSIS — M79604 Pain in right leg: Secondary | ICD-10-CM | POA: Diagnosis not present

## 2021-03-23 ENCOUNTER — Other Ambulatory Visit: Payer: Self-pay | Admitting: Cardiology

## 2021-03-26 ENCOUNTER — Ambulatory Visit: Payer: Medicare PPO | Admitting: Cardiology

## 2021-03-29 ENCOUNTER — Ambulatory Visit: Payer: Medicare PPO | Admitting: Cardiology

## 2021-04-07 ENCOUNTER — Telehealth: Payer: Self-pay | Admitting: Family Medicine

## 2021-04-07 NOTE — Telephone Encounter (Signed)
Left message for patient to call back and schedule Medicare Annual Wellness Visit (AWV) either virtually or in office. Left  my jabber number [phone]   AWV-I per [place] 08/21/12  please schedule at anytime with [facility] Nurse Health Advisor 1 or 2   This should be a 45 minute visit.  

## 2021-05-12 DIAGNOSIS — M1612 Unilateral primary osteoarthritis, left hip: Secondary | ICD-10-CM | POA: Diagnosis not present

## 2021-05-12 DIAGNOSIS — Z79891 Long term (current) use of opiate analgesic: Secondary | ICD-10-CM | POA: Diagnosis not present

## 2021-05-12 DIAGNOSIS — G629 Polyneuropathy, unspecified: Secondary | ICD-10-CM | POA: Diagnosis not present

## 2021-05-12 DIAGNOSIS — G894 Chronic pain syndrome: Secondary | ICD-10-CM | POA: Diagnosis not present

## 2021-05-19 ENCOUNTER — Telehealth: Payer: Self-pay | Admitting: Family Medicine

## 2021-05-19 NOTE — Telephone Encounter (Signed)
Left message for patient to call back and schedule Medicare Annual Wellness Visit (AWV) either virtually or in office. Left  my jabber number [phone]   AWV-I per [place] 08/21/12  please schedule at anytime with [facility] Nurse Health Advisor 1 or 2   This should be a 45 minute visit.  

## 2021-05-21 ENCOUNTER — Other Ambulatory Visit: Payer: Self-pay | Admitting: Family Medicine

## 2021-05-30 ENCOUNTER — Other Ambulatory Visit: Payer: Self-pay | Admitting: Family Medicine

## 2021-06-07 ENCOUNTER — Ambulatory Visit: Payer: Medicare PPO | Admitting: Cardiology

## 2021-06-09 DIAGNOSIS — M1612 Unilateral primary osteoarthritis, left hip: Secondary | ICD-10-CM | POA: Diagnosis not present

## 2021-06-09 DIAGNOSIS — G894 Chronic pain syndrome: Secondary | ICD-10-CM | POA: Diagnosis not present

## 2021-06-09 DIAGNOSIS — Z79891 Long term (current) use of opiate analgesic: Secondary | ICD-10-CM | POA: Diagnosis not present

## 2021-06-09 DIAGNOSIS — G629 Polyneuropathy, unspecified: Secondary | ICD-10-CM | POA: Diagnosis not present

## 2021-06-21 ENCOUNTER — Telehealth: Payer: Self-pay | Admitting: Family Medicine

## 2021-06-21 NOTE — Telephone Encounter (Signed)
Left message for patient to call back and schedule Medicare Annual Wellness Visit (AWV) either virtually or in office. Left  my jabber number [phone]   AWV-I per [place] 08/21/12  please schedule at anytime with [facility] Nurse Health Advisor 1 or 2   This should be a 45 minute visit.  

## 2021-07-07 DIAGNOSIS — G894 Chronic pain syndrome: Secondary | ICD-10-CM | POA: Diagnosis not present

## 2021-07-07 DIAGNOSIS — Z79891 Long term (current) use of opiate analgesic: Secondary | ICD-10-CM | POA: Diagnosis not present

## 2021-07-07 DIAGNOSIS — M1612 Unilateral primary osteoarthritis, left hip: Secondary | ICD-10-CM | POA: Diagnosis not present

## 2021-07-07 DIAGNOSIS — G629 Polyneuropathy, unspecified: Secondary | ICD-10-CM | POA: Diagnosis not present

## 2021-07-10 ENCOUNTER — Other Ambulatory Visit: Payer: Self-pay | Admitting: Cardiology

## 2021-07-30 ENCOUNTER — Other Ambulatory Visit: Payer: Self-pay | Admitting: Cardiology

## 2021-07-30 ENCOUNTER — Telehealth: Payer: Self-pay | Admitting: Family Medicine

## 2021-07-30 NOTE — Telephone Encounter (Signed)
Left message for patient to call back and schedule Medicare Annual Wellness Visit (AWV) either virtually or in office. Left  my jabber number [phone]   AWV-I per [place] 08/21/12  please schedule at anytime with [facility] Nurse Health Advisor 1 or 2   This should be a 45 minute visit.  

## 2021-07-30 NOTE — Telephone Encounter (Signed)
Telmisartan 20 mg # 30 x 2 refills sent to [id number]- [place], [place] - [address]

## 2021-08-04 DIAGNOSIS — Z79891 Long term (current) use of opiate analgesic: Secondary | ICD-10-CM | POA: Diagnosis not present

## 2021-08-04 DIAGNOSIS — G629 Polyneuropathy, unspecified: Secondary | ICD-10-CM | POA: Diagnosis not present

## 2021-08-04 DIAGNOSIS — M1612 Unilateral primary osteoarthritis, left hip: Secondary | ICD-10-CM | POA: Diagnosis not present

## 2021-08-04 DIAGNOSIS — G894 Chronic pain syndrome: Secondary | ICD-10-CM | POA: Diagnosis not present

## 2021-08-31 ENCOUNTER — Ambulatory Visit: Payer: Medicare PPO | Admitting: Cardiology

## 2021-09-02 ENCOUNTER — Telehealth: Payer: Self-pay | Admitting: Family Medicine

## 2021-09-02 DIAGNOSIS — Z79891 Long term (current) use of opiate analgesic: Secondary | ICD-10-CM | POA: Diagnosis not present

## 2021-09-02 DIAGNOSIS — G894 Chronic pain syndrome: Secondary | ICD-10-CM | POA: Diagnosis not present

## 2021-09-02 DIAGNOSIS — G629 Polyneuropathy, unspecified: Secondary | ICD-10-CM | POA: Diagnosis not present

## 2021-09-02 DIAGNOSIS — M1612 Unilateral primary osteoarthritis, left hip: Secondary | ICD-10-CM | POA: Diagnosis not present

## 2021-09-02 NOTE — Telephone Encounter (Signed)
Left message for patient to call back and schedule Medicare Annual Wellness Visit (AWV) either virtually or in office. Left  my Herbie Drape number [phone]    AWV-I per [place] 08/21/12;  please schedule at anytime with [facility] Nurse Health Advisor 1 or 2   This should be a 45 minute visit.

## 2021-09-06 ENCOUNTER — Other Ambulatory Visit: Payer: Self-pay | Admitting: Family Medicine

## 2021-09-09 DIAGNOSIS — N4340 Spermatocele of epididymis, unspecified: Secondary | ICD-10-CM

## 2021-09-09 HISTORY — DX: Spermatocele of epididymis, unspecified: N43.40

## 2021-09-15 NOTE — Progress Notes (Unsigned)
Cardiology Office Note:    Date:  09/16/2021   ID:  Jimmy Black, DOB Jul 10, 1960, MRN [id number]  PCP:  Eulas Post, MD  Cardiologist:  Shirlee More, MD    Referring MD: Eulas Post, MD    ASSESSMENT:    1. Paroxysmal atrial fibrillation (HCC)   2. Essential hypertension   3. Obstructive sleep apnea    PLAN:    In order of problems listed above:  He is markedly improved with lifestyle change including weight loss and activity has resolved signs and symptoms of obstructive sleep apnea no recurrence of atrial fibrillation blood pressure is well controlled office ARB and no longer takes a statin.  I encouraged him to purchase a smart watch for self-management Improved no longer requires antihypertensive agents Continue his nonprescription niacin and fish oil.  I told him not to combine a statin and niacin   Next appointment: 1 year   Medication Adjustments/Labs and Tests Ordered: Current medicines are reviewed at length with the patient today.  Concerns regarding medicines are outlined above.  Orders Placed This Encounter  Procedures   EKG 12-Lead   No orders of the defined types were placed in this encounter.   Chief Complaint  Patient presents with   Follow-up   Atrial Fibrillation    History of Present Illness:    Jimmy Black is a 61 y.o. male with a hx of paroxysmal atrial fibrillation sleep apnea hypertension hyperlipidemia last seen 03/19/2020. He had an echocardiogram performed 04/01/2019 suggesting mild enlargement of the ascending aorta however CT scan at the hospital showed the aorta to be normal in caliber.  He also had mild LVH and mild left atrial enlargement..   Compliance with diet, lifestyle and medications: Yes  He has lost weight and feels markedly improved no longer requires CPAP and is not having symptoms of obstructive sleep apnea He has had no recurrence of palpitation or rapid heart rhythm with exercise and  weight loss No edema shortness of breath chest pain palpitation or syncope His lipids are well controlled with an LDL of 60 09/28/2020 he tells me he no longer takes a statin He stopped his blood pressure medication his home blood pressure runs in the range of 120 or less over 80. Past Medical History:  Diagnosis Date   Alcohol abuse    Allergic rhinitis 06/21/2015   Anxiety    takes Paxil daily   Anxiety state 03/07/2009   Qualifier: Diagnosis of  By: [place]  MD, Adlih     Arthritis of right knee 11/16/2011   Atrial fibrillation ([place]) 11/16/2018   Chicken pox    Chronic neuropathic pain 06/11/2019   Family history of anesthesia complication    brother got sick after anesthesia   History of alcohol abuse 08/17/2011   Hyperlipidemia    takes Fish Oil daily   Hypertension 03/20/2018   Joint pain    Joint swelling    KNEE PAIN, RIGHT 03/07/2009   Qualifier: Diagnosis of  By: [place]  MD, Adlih     Numbness    both leg and feet   Obstructive sleep apnea 06/21/2015   Osteoarthritis    "right knee; both hips" (12/03/2012)   Palpitations 11/16/2018   Peripheral neuropathy    Sleep apnea    not started cpap as of 5-6 -2021 pv    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 Bilateral   Splenic laceration 12/21/2016    Past  Surgical History:  Procedure Laterality Date   EXCISIONAL TOTAL KNEE ARTHROPLASTY Right 12/03/2012   Procedure: EXCISIONAL TOTAL KNEE ARTHROPLASTY (POLY SWAP), SCAR TISSUE REMOVAL, QUADRICEPSPLASTY;  Surgeon: Vickey Huger, MD;  Location: [facility];  Service: Orthopedics;  Laterality: Right;   INCISION AND DRAINAGE OF WOUND Right 2006   "w/jet lavage; had to do this twice after the OR" (12/03/2012)   [place]   "benign tumor removal" (12/03/2012)   SHOULDER ARTHROSCOPY WITH OPEN ROTATOR CUFF REPAIR Right 2006   SHOULDER ARTHROSCOPY WITH OPEN ROTATOR CUFF REPAIR Left 06/2012   SPERMATOCELECTOMY      TOTAL KNEE ARTHROPLASTY  11/16/2011   Procedure: TOTAL KNEE ARTHROPLASTY;  Surgeon: Kerin Salen, MD;  Location: [facility];  Service: Orthopedics;  Laterality: Right;  right total knee arthroplasty   TOTAL KNEE REVISION WITH SCAR DEBRIDEMENT/PATELLA REVISION WITH POLY EXCHANGE Right 12/03/2012   "not sure what they did exactly" (10/'13/2014)    Current Medications: Current Meds  Medication Sig   aspirin EC 81 MG tablet Take 81 mg by mouth daily.   buprenorphine (SUBUTEX) 8 MG SUBL SL tablet    Garlic 412 MG TABS Take 1 tablet by mouth 3 (three) times daily.    ibuprofen (ADVIL,MOTRIN) 200 MG tablet Take 600 mg by mouth daily as needed for pain.   MAGNESIUM PO Take by mouth. 300-400 mg daily   Multiple Vitamins-Minerals (MENS MULTI VITAMIN & MINERAL PO) Take 1 tablet by mouth daily.    niacin 500 MG tablet Take 500 mg by mouth at bedtime.   Omega-3 Fatty Acids (FISH OIL CONCENTRATE PO) Take 1 capsule by mouth 3 (three) times daily.    PARoxetine (PAXIL) 20 MG tablet TAKE 1 TABLET BY MOUTH EVERY DAY   rosuvastatin (CRESTOR) 20 MG tablet TAKE 1 TABLET BY MOUTH ONCE A DAY.PHYSICAL NEEDED FOR FURTHER REFILLS.     Allergies:   Patient has no known allergies.   Social History   Socioeconomic History   Marital status: Divorced    Spouse name: Not on file   Number of children: Not on file   Years of education: Not on file   Highest education level: Not on file  Occupational History   Not on file  Tobacco Use   Smoking status: Never   Smokeless tobacco: Current    Types: Snuff, Chew  Vaping Use   Vaping Use: Never used  Substance and Sexual Activity   Alcohol use: Not Currently    Comment: 12/03/2012 "quit all alcohol in 1998; family hx of problems w/it"   Drug use: No   Sexual activity: Not Currently  Other Topics Concern   Not on file  Social History Narrative   Not on file   Social Determinants of Health   Financial Resource Strain: Not on file  Food Insecurity: Not on  file  Transportation Needs: Not on file  Physical Activity: Not on file  Stress: Not on file  Social Connections: Not on file     Family History: The patient's family history includes Alcohol abuse in his paternal aunt and paternal grandfather; Breast cancer in his cousin, maternal aunt, and mother; Cancer in his father and maternal aunt; Cancer (age of onset: 69) in his mother; Heart disease in his father, maternal grandfather, and paternal grandfather; Hyperlipidemia in his brother and mother; Prostate cancer in his father. There is no history of Colon cancer, Colon polyps, Esophageal cancer, Rectal cancer, or Stomach cancer. ROS:   Please see the  history of present illness.    All other systems reviewed and are negative.  EKGs/Labs/Other Studies Reviewed:    The following studies were reviewed today:  EKG:  EKG ordered today and personally reviewed.  The ekg ordered today demonstrates sinus rhythm minor T wave abnormality otherwise normal EKG  Recent Labs: 09/28/2020: ALT 24; BUN 19; Creatinine, Ser 0.77; Hemoglobin 13.7; Platelets 234.0; Potassium 4.2; Sodium 137; TSH 1.45  Recent Lipid Panel    Component Value Date/Time   CHOL 124 09/28/2020 0836   TRIG 123.0 09/28/2020 0836   HDL 39.30 09/28/2020 0836   CHOLHDL 3 09/28/2020 0836   VLDL 24.6 09/28/2020 0836   LDLCALC 60 09/28/2020 0836   LDLDIRECT 110.0 03/20/2018 1206    Physical Exam:    VS:  BP 122/78   Pulse 70   Ht '6\' 6"'$  (1.981 m)   Wt 267 lb 0.6 oz (121.1 kg)   SpO2 96%   BMI 30.86 kg/m     Wt Readings from Last 3 Encounters:  09/16/21 267 lb 0.6 oz (121.1 kg)  09/28/20 257 lb 4.8 oz (116.7 kg)  03/19/20 266 lb 12.8 oz (121 kg)     GEN:  Well nourished, well developed in no acute distress HEENT: Normal NECK: No JVD; No carotid bruits LYMPHATICS: No lymphadenopathy CARDIAC: RRR, no murmurs, rubs, gallops RESPIRATORY:  Clear to auscultation without rales, wheezing or rhonchi  ABDOMEN: Soft, non-tender,  non-distended MUSCULOSKELETAL:  No edema; No deformity  SKIN: Warm and dry NEUROLOGIC:  Alert and oriented x 3 PSYCHIATRIC:  Normal affect    Signed, Shirlee More, MD  09/16/2021 4:00 PM    [facility]

## 2021-09-16 ENCOUNTER — Encounter: Payer: Self-pay | Admitting: Cardiology

## 2021-09-16 ENCOUNTER — Ambulatory Visit: Payer: Medicare PPO | Admitting: Cardiology

## 2021-09-16 VITALS — BP 122/78 | HR 70 | Ht 78.0 in | Wt 267.0 lb

## 2021-09-16 DIAGNOSIS — I480 Paroxysmal atrial fibrillation: Secondary | ICD-10-CM | POA: Diagnosis not present

## 2021-09-16 DIAGNOSIS — I10 Essential (primary) hypertension: Secondary | ICD-10-CM | POA: Diagnosis not present

## 2021-09-16 DIAGNOSIS — G4733 Obstructive sleep apnea (adult) (pediatric): Secondary | ICD-10-CM | POA: Diagnosis not present

## 2021-09-16 NOTE — Patient Instructions (Signed)
Medication Instructions:  Your physician recommends that you continue on your current medications as directed. Please refer to the Current Medication list given to you today.  *If you need a refill on your cardiac medications before your next appointment, please call your pharmacy*   Lab Work: None If you have labs (blood work) drawn today and your tests are completely normal, you will receive your results only by: [place] (if you have MyChart) OR A paper copy in the mail If you have any lab test that is abnormal or we need to change your treatment, we will call you to review the results.   Testing/Procedures: None   Follow-Up: At [facility], you and your health needs are our priority.  As part of our continuing mission to provide you with exceptional heart care, we have created designated Provider Care Teams.  These Care Teams include your primary Cardiologist (physician) and Advanced Practice Providers (APPs -  Physician Assistants and Nurse Practitioners) who all work together to provide you with the care you need, when you need it.  We recommend signing up for the patient portal called "MyChart".  Sign up information is provided on this After Visit Summary.  MyChart is used to connect with patients for Virtual Visits (Telemedicine).  Patients are able to view lab/test results, encounter notes, upcoming appointments, etc.  Non-urgent messages can be sent to your provider as well.   To learn more about what you can do with MyChart, go to [email or web address].    Your next appointment:   1 year(s)  The format for your next appointment:   In Person  Provider:   Shirlee More, MD{   Other Instructions Purchase an Apple watch  Sign-up in [place]

## 2021-09-23 ENCOUNTER — Other Ambulatory Visit: Payer: Self-pay | Admitting: Family Medicine

## 2021-09-28 DIAGNOSIS — G894 Chronic pain syndrome: Secondary | ICD-10-CM | POA: Diagnosis not present

## 2021-09-28 DIAGNOSIS — G629 Polyneuropathy, unspecified: Secondary | ICD-10-CM | POA: Diagnosis not present

## 2021-09-28 DIAGNOSIS — Z79891 Long term (current) use of opiate analgesic: Secondary | ICD-10-CM | POA: Diagnosis not present

## 2021-09-28 DIAGNOSIS — M1612 Unilateral primary osteoarthritis, left hip: Secondary | ICD-10-CM | POA: Diagnosis not present

## 2021-10-04 ENCOUNTER — Encounter: Payer: Medicare PPO | Admitting: Family Medicine

## 2021-10-07 ENCOUNTER — Other Ambulatory Visit: Payer: Self-pay | Admitting: Family Medicine

## 2021-10-13 ENCOUNTER — Telehealth: Payer: Self-pay | Admitting: Family Medicine

## 2021-10-13 NOTE — Telephone Encounter (Signed)
Left message for patient to call back and schedule Medicare Annual Wellness Visit (AWV) either virtually or in office. Left  my Herbie Drape number [phone]   AWV-I per [place] 08/21/12  please schedule at anytime with [facility] Nurse Health Advisor 1 or 2   This should be a 45 minute visit.

## 2021-11-02 DIAGNOSIS — M1612 Unilateral primary osteoarthritis, left hip: Secondary | ICD-10-CM | POA: Diagnosis not present

## 2021-11-02 DIAGNOSIS — Z79891 Long term (current) use of opiate analgesic: Secondary | ICD-10-CM | POA: Diagnosis not present

## 2021-11-02 DIAGNOSIS — G894 Chronic pain syndrome: Secondary | ICD-10-CM | POA: Diagnosis not present

## 2021-11-02 DIAGNOSIS — R29898 Other symptoms and signs involving the musculoskeletal system: Secondary | ICD-10-CM | POA: Diagnosis not present

## 2021-11-02 DIAGNOSIS — M545 Low back pain, unspecified: Secondary | ICD-10-CM | POA: Diagnosis not present

## 2021-11-02 DIAGNOSIS — G629 Polyneuropathy, unspecified: Secondary | ICD-10-CM | POA: Diagnosis not present

## 2021-11-04 ENCOUNTER — Other Ambulatory Visit: Payer: Self-pay | Admitting: Family Medicine

## 2021-11-04 ENCOUNTER — Telehealth: Payer: Self-pay | Admitting: Family Medicine

## 2021-11-04 NOTE — Telephone Encounter (Signed)
Left message for patient to call back and schedule Medicare Annual Wellness Visit (AWV) either virtually or in office. Left  my Herbie Drape number [phone]   AWV-I per [place] 08/21/12  please schedule at anytime with [facility] Nurse Health Advisor 1 or 2   This should be a 45 minute visit.

## 2021-11-22 ENCOUNTER — Encounter: Payer: Medicare PPO | Admitting: Family Medicine

## 2021-11-30 DIAGNOSIS — M545 Low back pain, unspecified: Secondary | ICD-10-CM | POA: Diagnosis not present

## 2021-11-30 DIAGNOSIS — R29898 Other symptoms and signs involving the musculoskeletal system: Secondary | ICD-10-CM | POA: Diagnosis not present

## 2021-11-30 DIAGNOSIS — M1612 Unilateral primary osteoarthritis, left hip: Secondary | ICD-10-CM | POA: Diagnosis not present

## 2021-11-30 DIAGNOSIS — G894 Chronic pain syndrome: Secondary | ICD-10-CM | POA: Diagnosis not present

## 2021-11-30 DIAGNOSIS — G629 Polyneuropathy, unspecified: Secondary | ICD-10-CM | POA: Diagnosis not present

## 2021-11-30 DIAGNOSIS — Z79891 Long term (current) use of opiate analgesic: Secondary | ICD-10-CM | POA: Diagnosis not present

## 2021-12-02 ENCOUNTER — Other Ambulatory Visit: Payer: Self-pay | Admitting: Family Medicine

## 2021-12-16 ENCOUNTER — Telehealth: Payer: Self-pay | Admitting: Family Medicine

## 2021-12-16 NOTE — Telephone Encounter (Signed)
Left message for patient to call back and schedule Medicare Annual Wellness Visit (AWV) either virtually or in office. Left  my Herbie Drape number [phone]   [place] L[facility] 05/22/12 per [place]  please schedule with Nurse Health Adviser   45 min for awv-i and in office appointments 30 min for awv-s  phone/virtual appointments

## 2021-12-21 ENCOUNTER — Encounter: Payer: Medicare PPO | Admitting: Family Medicine

## 2021-12-28 DIAGNOSIS — Z79891 Long term (current) use of opiate analgesic: Secondary | ICD-10-CM | POA: Diagnosis not present

## 2021-12-28 DIAGNOSIS — R29898 Other symptoms and signs involving the musculoskeletal system: Secondary | ICD-10-CM | POA: Diagnosis not present

## 2021-12-28 DIAGNOSIS — G629 Polyneuropathy, unspecified: Secondary | ICD-10-CM | POA: Diagnosis not present

## 2021-12-28 DIAGNOSIS — G894 Chronic pain syndrome: Secondary | ICD-10-CM | POA: Diagnosis not present

## 2021-12-28 DIAGNOSIS — M545 Low back pain, unspecified: Secondary | ICD-10-CM | POA: Diagnosis not present

## 2021-12-28 DIAGNOSIS — M1612 Unilateral primary osteoarthritis, left hip: Secondary | ICD-10-CM | POA: Diagnosis not present

## 2021-12-30 ENCOUNTER — Other Ambulatory Visit: Payer: Self-pay | Admitting: Family Medicine

## 2022-01-08 ENCOUNTER — Other Ambulatory Visit: Payer: Self-pay | Admitting: Family Medicine

## 2022-01-18 ENCOUNTER — Encounter: Payer: Medicare PPO | Admitting: Family Medicine

## 2022-01-25 ENCOUNTER — Other Ambulatory Visit: Payer: Self-pay | Admitting: Family Medicine

## 2022-01-25 DIAGNOSIS — R29898 Other symptoms and signs involving the musculoskeletal system: Secondary | ICD-10-CM | POA: Diagnosis not present

## 2022-01-25 DIAGNOSIS — M1612 Unilateral primary osteoarthritis, left hip: Secondary | ICD-10-CM | POA: Diagnosis not present

## 2022-01-25 DIAGNOSIS — Z79891 Long term (current) use of opiate analgesic: Secondary | ICD-10-CM | POA: Diagnosis not present

## 2022-01-25 DIAGNOSIS — G894 Chronic pain syndrome: Secondary | ICD-10-CM | POA: Diagnosis not present

## 2022-01-25 DIAGNOSIS — G629 Polyneuropathy, unspecified: Secondary | ICD-10-CM | POA: Diagnosis not present

## 2022-01-25 DIAGNOSIS — M545 Low back pain, unspecified: Secondary | ICD-10-CM | POA: Diagnosis not present

## 2022-01-26 ENCOUNTER — Telehealth: Payer: Self-pay | Admitting: Family Medicine

## 2022-01-26 ENCOUNTER — Other Ambulatory Visit: Payer: Self-pay | Admitting: Family Medicine

## 2022-01-26 NOTE — Telephone Encounter (Signed)
Left message for patient to call back and schedule Medicare Annual Wellness Visit (AWV) either virtually or in office. Left  my Herbie Drape number [phone]  [facility] 08/21/12 per [place]  please schedule with Nurse Health Adviser   45 min for awv-i and in office appointments 30 min for awv-s  phone/virtual appointments

## 2022-02-07 ENCOUNTER — Encounter: Payer: Self-pay | Admitting: Cardiology

## 2022-02-08 ENCOUNTER — Other Ambulatory Visit: Payer: Self-pay

## 2022-02-08 MED ORDER — TELMISARTAN 20 MG PO TABS: 20.0000 mg | ORAL_TABLET | Freq: Every day | ORAL | 3 refills | Status: DC

## 2022-02-08 NOTE — Telephone Encounter (Signed)
Temisartan '20mg'$  1 tablet daily- per Dr. Bettina Gavia

## 2022-02-24 DIAGNOSIS — G629 Polyneuropathy, unspecified: Secondary | ICD-10-CM | POA: Diagnosis not present

## 2022-02-24 DIAGNOSIS — G894 Chronic pain syndrome: Secondary | ICD-10-CM | POA: Diagnosis not present

## 2022-02-24 DIAGNOSIS — Z79891 Long term (current) use of opiate analgesic: Secondary | ICD-10-CM | POA: Diagnosis not present

## 2022-02-24 DIAGNOSIS — M1612 Unilateral primary osteoarthritis, left hip: Secondary | ICD-10-CM | POA: Diagnosis not present

## 2022-02-24 DIAGNOSIS — M545 Low back pain, unspecified: Secondary | ICD-10-CM | POA: Diagnosis not present

## 2022-02-24 DIAGNOSIS — R29898 Other symptoms and signs involving the musculoskeletal system: Secondary | ICD-10-CM | POA: Diagnosis not present

## 2022-02-25 ENCOUNTER — Encounter: Payer: Medicare PPO | Admitting: Family Medicine

## 2022-02-26 ENCOUNTER — Other Ambulatory Visit: Payer: Self-pay | Admitting: Family Medicine

## 2022-03-02 ENCOUNTER — Other Ambulatory Visit: Payer: Self-pay | Admitting: Family Medicine

## 2022-03-04 ENCOUNTER — Encounter: Payer: Self-pay | Admitting: Family Medicine

## 2022-03-07 ENCOUNTER — Encounter: Payer: Self-pay | Admitting: Family Medicine

## 2022-03-07 MED ORDER — PAROXETINE HCL 20 MG PO TABS: 20.0000 mg | ORAL_TABLET | Freq: Every day | ORAL | 0 refills | Status: DC

## 2022-03-07 NOTE — Telephone Encounter (Signed)
Noted  

## 2022-03-11 ENCOUNTER — Encounter: Payer: Medicare PPO | Admitting: Family Medicine

## 2022-03-16 DIAGNOSIS — R29898 Other symptoms and signs involving the musculoskeletal system: Secondary | ICD-10-CM | POA: Diagnosis not present

## 2022-03-16 DIAGNOSIS — Z79891 Long term (current) use of opiate analgesic: Secondary | ICD-10-CM | POA: Diagnosis not present

## 2022-03-16 DIAGNOSIS — G629 Polyneuropathy, unspecified: Secondary | ICD-10-CM | POA: Diagnosis not present

## 2022-03-16 DIAGNOSIS — M1612 Unilateral primary osteoarthritis, left hip: Secondary | ICD-10-CM | POA: Diagnosis not present

## 2022-03-16 DIAGNOSIS — M545 Low back pain, unspecified: Secondary | ICD-10-CM | POA: Diagnosis not present

## 2022-03-16 DIAGNOSIS — M25561 Pain in right knee: Secondary | ICD-10-CM | POA: Diagnosis not present

## 2022-03-16 DIAGNOSIS — G894 Chronic pain syndrome: Secondary | ICD-10-CM | POA: Diagnosis not present

## 2022-03-18 ENCOUNTER — Encounter: Payer: Self-pay | Admitting: Family Medicine

## 2022-03-18 ENCOUNTER — Ambulatory Visit (INDEPENDENT_AMBULATORY_CARE_PROVIDER_SITE_OTHER): Payer: Medicare PPO | Admitting: Family Medicine

## 2022-03-18 VITALS — BP 146/76 | HR 55 | Temp 98.5°F | Ht 77.56 in | Wt 234.8 lb

## 2022-03-18 DIAGNOSIS — R252 Cramp and spasm: Secondary | ICD-10-CM

## 2022-03-18 DIAGNOSIS — Z0001 Encounter for general adult medical examination with abnormal findings: Secondary | ICD-10-CM

## 2022-03-18 DIAGNOSIS — Z125 Encounter for screening for malignant neoplasm of prostate: Secondary | ICD-10-CM | POA: Diagnosis not present

## 2022-03-18 DIAGNOSIS — Z0000 Encounter for general adult medical examination without abnormal findings: Secondary | ICD-10-CM | POA: Diagnosis not present

## 2022-03-18 DIAGNOSIS — E782 Mixed hyperlipidemia: Secondary | ICD-10-CM | POA: Diagnosis not present

## 2022-03-18 DIAGNOSIS — Z23 Encounter for immunization: Secondary | ICD-10-CM

## 2022-03-18 DIAGNOSIS — I10 Essential (primary) hypertension: Secondary | ICD-10-CM | POA: Diagnosis not present

## 2022-03-18 DIAGNOSIS — D649 Anemia, unspecified: Secondary | ICD-10-CM

## 2022-03-18 LAB — BASIC METABOLIC PANEL
BUN: 21 mg/dL (ref 6–23)
CO2: 27 mEq/L (ref 19–32)
Calcium: 9.8 mg/dL (ref 8.4–10.5)
Chloride: 104 mEq/L (ref 96–112)
Creatinine, Ser: 0.74 mg/dL (ref 0.40–1.50)
GFR: 99.23 mL/min (ref >60.00)
Glucose, Bld: 85 mg/dL (ref 70–99)
Potassium: 4.9 mEq/L (ref 3.5–5.1)
Sodium: 138 mEq/L (ref 135–145)

## 2022-03-18 LAB — CBC WITH DIFFERENTIAL/PLATELET
Basophils Absolute: 0.1 10*3/uL (ref 0.0–0.1)
Basophils Relative: 0.8 % (ref 0.0–3.0)
Eosinophils Absolute: 0.2 10*3/uL (ref 0.0–0.7)
Eosinophils Relative: 1.8 % (ref 0.0–5.0)
HCT: 35.4 % — ABNORMAL LOW (ref 39.0–52.0)
Hemoglobin: 11.9 g/dL — ABNORMAL LOW (ref 13.0–17.0)
Lymphocytes Relative: 22 % (ref 12.0–46.0)
Lymphs Abs: 1.9 10*3/uL (ref 0.7–4.0)
MCHC: 33.6 g/dL (ref 30.0–36.0)
MCV: 85 fl (ref 78.0–100.0)
Monocytes Absolute: 0.6 10*3/uL (ref 0.1–1.0)
Monocytes Relative: 7.3 % (ref 3.0–12.0)
Neutro Abs: 5.9 10*3/uL (ref 1.4–7.7)
Neutrophils Relative %: 68.1 % (ref 43.0–77.0)
Platelets: 419 10*3/uL — ABNORMAL HIGH (ref 150.0–400.0)
RBC: 4.17 Mil/uL — ABNORMAL LOW (ref 4.22–5.81)
RDW: 14.1 % (ref 11.5–15.5)
WBC: 8.6 10*3/uL (ref 4.0–10.5)

## 2022-03-18 LAB — HEPATIC FUNCTION PANEL
ALT: 23 U/L (ref 0–53)
AST: 25 U/L (ref 0–37)
Albumin: 4.6 g/dL (ref 3.5–5.2)
Alkaline Phosphatase: 58 U/L (ref 39–117)
Bilirubin, Direct: 0.1 mg/dL (ref 0.0–0.3)
Total Bilirubin: 0.6 mg/dL (ref 0.2–1.2)
Total Protein: 7.1 g/dL (ref 6.0–8.3)

## 2022-03-18 LAB — LIPID PANEL
Cholesterol: 143 mg/dL (ref 0–200)
HDL: 49.9 mg/dL (ref >39.00)
LDL Cholesterol: 75 mg/dL (ref 0–99)
NonHDL: 93.02
Total CHOL/HDL Ratio: 3
Triglycerides: 91 mg/dL (ref 0.0–149.0)
VLDL: 18.2 mg/dL (ref 0.0–40.0)

## 2022-03-18 LAB — PSA: PSA: 0.52 ng/mL (ref 0.10–4.00)

## 2022-03-18 LAB — MAGNESIUM: Magnesium: 2.1 mg/dL (ref 1.5–2.5)

## 2022-03-18 NOTE — Patient Instructions (Signed)
Consider Shingrix vaccine at some point this year  Consider repeat Cologuard by this July.

## 2022-03-18 NOTE — Progress Notes (Signed)
Established Patient Office Visit  Subjective   Patient ID: Jimmy Black, male    DOB: [birth year]  Age: 61 y.o. MRN: [id number]  Chief Complaint  Patient presents with   Annual Exam    HPI   Jimmy Black is seen for annual physical exam.  His current problems include history of hypertension, atrial fibrillation, obstructive sleep apnea, chronic peripheral neuropathy, osteoarthritis, history of alcohol abuse, hyperlipidemia, chronic anxiety and depression, and chronic neuropathic pain.  He remains on buprenorphine per another physician.  He still has occasional transient episodes of atrial fibrillation but infrequently.  He has intentionally lost some weight from 269 pounds back in April 2021 to a current weight of 234 pounds.  Swimming a mile every other day.  Has had some recent muscle cramps frequently involving hands and lower extremities.  Easily consumes over 64 ounces of water per day.  Health maintenance reviewed  -Cologuard was done 7/21 and negative -Needs flu vaccine and he would like to get this today -No history of Shingrix vaccine. -Tetanus due 2028 -Prior hepatitis C screen negative  Social history-he has been divorced for 14 years.  He has 56 year old daughter who currently lives with him.  Non-smoker.  Past history of alcohol abuse but not for several years.  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  She had history of hyperlipidemia.  Father with history of prostate cancer and heart disease.  He has 2 brothers.  They apparently have hyperlipidemia.  Past Medical History:  Diagnosis Date   Alcohol abuse    Allergic rhinitis 06/21/2015   Anxiety    takes Paxil daily   Anxiety state 03/07/2009   Qualifier: Diagnosis of  By: Moreno-Coll  MD, Adlih     Arthritis of right knee 11/16/2011   Atrial fibrillation ([place]) 11/16/2018   Chicken pox    Chronic neuropathic pain 06/11/2019   Family history of anesthesia complication     brother got sick after anesthesia   History of alcohol abuse 08/17/2011   Hyperlipidemia    takes Fish Oil daily   Hypertension 03/20/2018   Joint pain    Joint swelling    KNEE PAIN, RIGHT 03/07/2009   Qualifier: Diagnosis of  By: Moreno-Coll  MD, Adlih     Numbness    both leg and feet   Obstructive sleep apnea 06/21/2015   Osteoarthritis    "right knee; both hips" (12/03/2012)   Palpitations 11/16/2018   Peripheral neuropathy    Sleep apnea    not started cpap as of 5-6 -2021 pv    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 Bilateral   Splenic laceration 12/21/2016   Past Surgical History:  Procedure Laterality Date   EXCISIONAL TOTAL KNEE ARTHROPLASTY Right 12/03/2012   Procedure: EXCISIONAL TOTAL KNEE ARTHROPLASTY (POLY SWAP), SCAR TISSUE REMOVAL, QUADRICEPSPLASTY;  Surgeon: Vickey Huger, MD;  Location: [place];  Service: Orthopedics;  Laterality: Right;   INCISION AND DRAINAGE OF WOUND Right 2006   "w/jet lavage; had to do this twice after the OR" (12/03/2012)   [place]   "benign tumor removal" (12/03/2012)   SHOULDER ARTHROSCOPY WITH OPEN ROTATOR CUFF REPAIR Right 2006   SHOULDER ARTHROSCOPY WITH OPEN ROTATOR CUFF REPAIR Left 06/2012   SPERMATOCELECTOMY     TOTAL KNEE ARTHROPLASTY  11/16/2011   Procedure: TOTAL KNEE ARTHROPLASTY;  Surgeon: Kerin Salen, MD;  Location: [place];  Service: Orthopedics;  Laterality: Right;  right total knee arthroplasty   TOTAL KNEE REVISION WITH SCAR DEBRIDEMENT/PATELLA REVISION WITH POLY EXCHANGE Right 12/03/2012   "not sure what they did exactly" (10/'13/2014)    reports that he has never smoked. His smokeless tobacco use includes snuff and chew. He reports that he does not currently use alcohol. He reports that he does not use drugs. family history includes Alcohol abuse in his paternal aunt and paternal grandfather; Breast cancer in his cousin, maternal aunt, and mother;  Cancer in his father and maternal aunt; Cancer (age of onset: 36) in his mother; Heart disease in his father, maternal grandfather, and paternal grandfather; Hyperlipidemia in his brother and mother; Prostate cancer in his father. No Known Allergies   Review of Systems  Constitutional:  Negative for chills, fever, malaise/fatigue and weight loss.  HENT:  Negative for hearing loss.   Eyes:  Negative for blurred vision and double vision.  Respiratory:  Negative for cough and shortness of breath.   Cardiovascular:  Negative for chest pain, palpitations and leg swelling.  Gastrointestinal:  Negative for abdominal pain, blood in stool, constipation and diarrhea.  Genitourinary:  Negative for dysuria.  Skin:  Negative for rash.  Neurological:  Negative for dizziness, speech change, seizures, loss of consciousness and headaches.  Psychiatric/Behavioral:  Negative for depression.       Objective:     BP (!) 146/76 (BP Location: Left Arm, Patient Position: Sitting, Cuff Size: Large)   Pulse (!) 55   Temp 98.5 F (36.9 C) (Oral)   Ht 6' 5.56" (1.97 m)   Wt 234 lb 12.8 oz (106.5 kg)   SpO2 99%   BMI 27.44 kg/m  BP Readings from Last 3 Encounters:  03/18/22 (!) 146/76  09/16/21 122/78  09/28/20 132/70   Wt Readings from Last 3 Encounters:  03/18/22 234 lb 12.8 oz (106.5 kg)  09/16/21 267 lb 0.6 oz (121.1 kg)  09/28/20 257 lb 4.8 oz (116.7 kg)      Physical Exam   No results found for any visits on 03/18/22.    The ASCVD Risk score (Arnett DK, et al., 2019) failed to calculate for the following reasons:   The valid total cholesterol range is 130 to 320 mg/dL    Assessment & Plan:   Problem List Items Addressed This Visit       Unprioritized   Hypertension   Relevant Orders   Basic metabolic panel   Hyperlipidemia   Relevant Orders   Hepatic function panel   Lipid panel   Other Visit Diagnoses     Physical exam    -  Primary   Relevant Orders   CBC with  Differential/Platelet   PSA   Muscle cramps       Relevant Orders   Magnesium   Need for immunization against influenza       Relevant Orders   Flu Vaccine QUAD 6+ mos PF IM (Fluarix Quad PF) (Completed)     -Flu vaccine given -Discussed Shingrix and he will look into coverage -Check labs as above.  Include PSA specially with positive family history of prostate cancer  -Will check magnesium in addition to electrolytes with basic metabolic panel with his frequent muscle cramps.  Sounds like he is getting adequate fluid consumption.  -Discussed ongoing colon cancer screening.  He would like to consider Cologuard later this year  -Congratulated with his weight loss and exercise and hopefully he can sustain this.  No follow-ups on file.  Carolann Littler, MD

## 2022-03-19 ENCOUNTER — Encounter: Payer: Self-pay | Admitting: Family Medicine

## 2022-03-19 DIAGNOSIS — E782 Mixed hyperlipidemia: Secondary | ICD-10-CM

## 2022-03-20 ENCOUNTER — Other Ambulatory Visit: Payer: Self-pay | Admitting: Family Medicine

## 2022-03-22 ENCOUNTER — Other Ambulatory Visit: Payer: Medicare PPO

## 2022-03-22 ENCOUNTER — Encounter: Payer: Self-pay | Admitting: Family Medicine

## 2022-03-22 DIAGNOSIS — E782 Mixed hyperlipidemia: Secondary | ICD-10-CM

## 2022-03-22 MED ORDER — EZETIMIBE 10 MG PO TABS: 10.0000 mg | ORAL_TABLET | Freq: Every day | ORAL | 0 refills | Status: DC

## 2022-03-24 ENCOUNTER — Encounter: Payer: Self-pay | Admitting: Family Medicine

## 2022-03-24 ENCOUNTER — Telehealth: Payer: Self-pay | Admitting: Family Medicine

## 2022-03-24 LAB — LIPOPROTEIN A (LPA): Lipoprotein (a): 132 nmol/L — ABNORMAL HIGH (ref <75)

## 2022-03-24 NOTE — Addendum Note (Signed)
Addended by: Nilda Riggs on: 03/24/2022 09:29 AM   Modules accepted: Orders

## 2022-03-24 NOTE — Telephone Encounter (Signed)
Please see result note 

## 2022-03-24 NOTE — Telephone Encounter (Signed)
Pt is returning mykal call concerning blood work results

## 2022-03-29 ENCOUNTER — Encounter: Payer: Self-pay | Admitting: Family Medicine

## 2022-03-29 NOTE — Telephone Encounter (Signed)
Noted  

## 2022-04-06 ENCOUNTER — Encounter: Payer: Self-pay | Admitting: Family Medicine

## 2022-04-08 ENCOUNTER — Other Ambulatory Visit: Payer: Self-pay | Admitting: Family Medicine

## 2022-04-27 DIAGNOSIS — G629 Polyneuropathy, unspecified: Secondary | ICD-10-CM | POA: Diagnosis not present

## 2022-04-27 DIAGNOSIS — Z79891 Long term (current) use of opiate analgesic: Secondary | ICD-10-CM | POA: Diagnosis not present

## 2022-04-27 DIAGNOSIS — M1612 Unilateral primary osteoarthritis, left hip: Secondary | ICD-10-CM | POA: Diagnosis not present

## 2022-04-27 DIAGNOSIS — G894 Chronic pain syndrome: Secondary | ICD-10-CM | POA: Diagnosis not present

## 2022-04-27 DIAGNOSIS — M545 Low back pain, unspecified: Secondary | ICD-10-CM | POA: Diagnosis not present

## 2022-05-24 DIAGNOSIS — F1193 Opioid use, unspecified with withdrawal: Secondary | ICD-10-CM | POA: Diagnosis not present

## 2022-05-24 DIAGNOSIS — F1123 Opioid dependence with withdrawal: Secondary | ICD-10-CM | POA: Diagnosis not present

## 2022-05-24 DIAGNOSIS — Z760 Encounter for issue of repeat prescription: Secondary | ICD-10-CM | POA: Diagnosis not present

## 2022-05-26 DIAGNOSIS — G629 Polyneuropathy, unspecified: Secondary | ICD-10-CM | POA: Diagnosis not present

## 2022-05-26 DIAGNOSIS — Z79891 Long term (current) use of opiate analgesic: Secondary | ICD-10-CM | POA: Diagnosis not present

## 2022-05-26 DIAGNOSIS — G894 Chronic pain syndrome: Secondary | ICD-10-CM | POA: Diagnosis not present

## 2022-05-26 DIAGNOSIS — M545 Low back pain, unspecified: Secondary | ICD-10-CM | POA: Diagnosis not present

## 2022-05-26 DIAGNOSIS — M1612 Unilateral primary osteoarthritis, left hip: Secondary | ICD-10-CM | POA: Diagnosis not present

## 2022-05-30 ENCOUNTER — Telehealth: Payer: Self-pay

## 2022-05-30 NOTE — Telephone Encounter (Signed)
        Patient  visited [facility] on 05/24/2022  for treatment.   Telephone encounter attempt :  1st  A HIPAA compliant voice message was left requesting a return call.  Instructed patient to call back at [phone].   Katalena Malveaux [facility]  [facility] Population Health Community Resource Care Guide   [email or web address]  ?? [phone]   Website: [email or web address]  [email or web address]

## 2022-05-30 NOTE — Telephone Encounter (Signed)
        Patient  visited [facility] on 05/24/2022  for treatment.   Telephone encounter attempt :  2nd  A HIPAA compliant voice message was left requesting a return call.  Instructed patient to call back at [phone].   Jimmy Black [facility]  [facility] Population Health Community Resource Care Guide   [email or web address]  ?? [phone]   Website: [email or web address]  [email or web address]

## 2022-06-01 ENCOUNTER — Telehealth: Payer: Self-pay | Admitting: Family Medicine

## 2022-06-01 NOTE — Telephone Encounter (Signed)
Called patient to schedule Medicare Annual Wellness Visit (AWV). Unable to reach patient.  Last date of AWV:  AWV-I per PA*LMETTO 08/21/12  Please schedule an appointment at any time with [facility] or [facility].  If any questions, please contact me at [phone].  Thank you ,  [name] AWV direct phone # [phone]   Several message has been left *LM 06/01/22  01/26/22  cpe 03/18/22  12/16/21    11/04/21  10/13/21  09/02/21 07/30/21  06/21/21  05/19/21  04/07/21  01/25/21   11/02/20  new declined per phone *NOte can call later 08/31/20  *LM 08/28/20  07/16/20   04/17/20, 02/17/20; AWV-I per Franne Forts 08/21/12

## 2022-06-02 NOTE — Telephone Encounter (Signed)
Contacted Jimmy Black to schedule their annual wellness visit. Patient declined to schedule AWV at this time.  [name] AWV direct phone # [phone]  Patient returned my call  he left message stating he was not interested in scheduling AWV appt

## 2022-06-23 DIAGNOSIS — Z79891 Long term (current) use of opiate analgesic: Secondary | ICD-10-CM | POA: Diagnosis not present

## 2022-06-23 DIAGNOSIS — M1612 Unilateral primary osteoarthritis, left hip: Secondary | ICD-10-CM | POA: Diagnosis not present

## 2022-06-23 DIAGNOSIS — M545 Low back pain, unspecified: Secondary | ICD-10-CM | POA: Diagnosis not present

## 2022-06-23 DIAGNOSIS — G894 Chronic pain syndrome: Secondary | ICD-10-CM | POA: Diagnosis not present

## 2022-06-23 DIAGNOSIS — G629 Polyneuropathy, unspecified: Secondary | ICD-10-CM | POA: Diagnosis not present

## 2022-07-02 ENCOUNTER — Other Ambulatory Visit: Payer: Self-pay | Admitting: Family Medicine

## 2022-07-08 ENCOUNTER — Telehealth: Payer: Self-pay | Admitting: Family Medicine

## 2022-07-08 DIAGNOSIS — M1612 Unilateral primary osteoarthritis, left hip: Secondary | ICD-10-CM | POA: Diagnosis not present

## 2022-07-08 DIAGNOSIS — M25552 Pain in left hip: Secondary | ICD-10-CM | POA: Diagnosis not present

## 2022-07-08 NOTE — Telephone Encounter (Signed)
Patient dropped off document Surgical Clearance, to be filled out by provider. Patient requested to send it via Fax within 5-days. Document is located in providers tray at front office.Please advise at Mobile [phone] (mobile)

## 2022-07-11 ENCOUNTER — Telehealth: Payer: Self-pay

## 2022-07-11 NOTE — Telephone Encounter (Signed)
   Pre-operative Risk Assessment    Patient Name: Jimmy Black [facility]  DOB: [birth year] MRN: [id number]      Request for Surgical Clearance    Procedure:   Left Total Hip Arthroplasty  Date of Surgery:  Clearance TBD                                 Surgeon:  Dr Samson Frederic Surgeon's Group or Practice Name:  [facility] Phone number:  [phone]   [facility] Fax number:  [phone]   Type of Clearance Requested:   - Pharmacy:  Hold Aspirin Aspirin   Type of Anesthesia:  Spinal   Additional requests/questions:    [name]   07/11/2022, 3:08 PM

## 2022-07-11 NOTE — Telephone Encounter (Signed)
I have attempted to contact this patient by phone for pre-op clearance but neither numbers are working.

## 2022-07-11 NOTE — Telephone Encounter (Signed)
Forms placed in Dr. Claris Che folder

## 2022-07-11 NOTE — Telephone Encounter (Signed)
   Name: Jimmy Black  DOB: 18-Oct-1960  MRN: [id number]  Primary Cardiologist: None   Preoperative team, please contact this patient and set up a phone call appointment for further preoperative risk assessment. Please obtain consent and complete medication review. Thank you for your help.  I confirm that guidance regarding antiplatelet and oral anticoagulation therapy has been completed and, if necessary, noted below.  Per office protocol, if patient is without any new symptoms or concerns at the time of their virtual visit, he may hold Aspirin for 5-7 days prior to procedure. Please resume Aspirin as soon as possible postprocedure, at the discretion of the surgeon.     Joylene Grapes, NP 07/11/2022, 4:46 PM  [facility]

## 2022-07-14 ENCOUNTER — Telehealth: Payer: Self-pay | Admitting: *Deleted

## 2022-07-14 NOTE — Telephone Encounter (Signed)
Pt has been scheduled for tele pre op appt 08/19/22 @ 9 am. Med rec and consent done.    Patient Consent for Virtual Visit        Jimmy Black [id number] has provided verbal consent on 07/14/2022 for a virtual visit (video or telephone).   CONSENT FOR VIRTUAL VISIT FOR:  Jimmy Black  By participating in this virtual visit I agree to the following:  I hereby voluntarily request, consent and authorize [facility] and its employed or contracted physicians, physician assistants, nurse practitioners or other licensed health care professionals (the Practitioner), to provide me with telemedicine health care services (the "Services") as deemed necessary by the treating Practitioner. I acknowledge and consent to receive the Services by the Practitioner via telemedicine. I understand that the telemedicine visit will involve communicating with the Practitioner through live audiovisual communication technology and the disclosure of certain medical information by electronic transmission. I acknowledge that I have been given the opportunity to request an in-person assessment or other available alternative prior to the telemedicine visit and am voluntarily participating in the telemedicine visit.  I understand that I have the right to withhold or withdraw my consent to the use of telemedicine in the course of my care at any time, without affecting my right to future care or treatment, and that the Practitioner or I may terminate the telemedicine visit at any time. I understand that I have the right to inspect all information obtained and/or recorded in the course of the telemedicine visit and may receive copies of available information for a reasonable fee.  I understand that some of the potential risks of receiving the Services via telemedicine include:  Delay or interruption in medical evaluation due to technological equipment failure or disruption; Information transmitted may not be  sufficient (e.g. poor resolution of images) to allow for appropriate medical decision making by the Practitioner; and/or  In rare instances, security protocols could fail, causing a breach of personal health information.  Furthermore, I acknowledge that it is my responsibility to provide information about my medical history, conditions and care that is complete and accurate to the best of my ability. I acknowledge that Practitioner's advice, recommendations, and/or decision may be based on factors not within their control, such as incomplete or inaccurate data provided by me or distortions of diagnostic images or specimens that may result from electronic transmissions. I understand that the practice of medicine is not an exact science and that Practitioner makes no warranties or guarantees regarding treatment outcomes. I acknowledge that a copy of this consent can be made available to me via my patient portal [facility] MyChart), or I can request a printed copy by calling the office of [facility].    I understand that my insurance will be billed for this visit.   I have read or had this consent read to me. I understand the contents of this consent, which adequately explains the benefits and risks of the Services being provided via telemedicine.  I have been provided ample opportunity to ask questions regarding this consent and the Services and have had my questions answered to my satisfaction. I give my informed consent for the services to be provided through the use of telemedicine in my medical care

## 2022-07-14 NOTE — Telephone Encounter (Signed)
Patient is returning call. Requesting return call.  

## 2022-07-14 NOTE — Telephone Encounter (Signed)
Pt has been scheduled for tele pre op appt 08/19/22 @ 9 am. Med rec and consent are done. Pt did say the [phone] is no longer I use and to remove from his chart. I will update all parties involved.

## 2022-07-14 NOTE — Telephone Encounter (Signed)
I tried to reach the pt on [phone] though not valid. Called [phone] and was able to leave a message to call back to pre op team to schedule a tele pre op appt.

## 2022-07-26 DIAGNOSIS — M545 Low back pain, unspecified: Secondary | ICD-10-CM | POA: Diagnosis not present

## 2022-07-26 DIAGNOSIS — Z79891 Long term (current) use of opiate analgesic: Secondary | ICD-10-CM | POA: Diagnosis not present

## 2022-07-26 DIAGNOSIS — M1612 Unilateral primary osteoarthritis, left hip: Secondary | ICD-10-CM | POA: Diagnosis not present

## 2022-07-26 DIAGNOSIS — G629 Polyneuropathy, unspecified: Secondary | ICD-10-CM | POA: Diagnosis not present

## 2022-07-26 DIAGNOSIS — G894 Chronic pain syndrome: Secondary | ICD-10-CM | POA: Diagnosis not present

## 2022-07-27 NOTE — Telephone Encounter (Signed)
Pt states he needed to move his pre op tele appt up sooner. Pt has now been scheduled for tele 08/05/22 @ 2:40. I will update all parties involved.

## 2022-08-01 ENCOUNTER — Ambulatory Visit: Payer: Medicare PPO | Admitting: Family Medicine

## 2022-08-04 NOTE — Progress Notes (Signed)
Virtual Visit via Telephone Note   Because of Jimmy Black's co-morbid illnesses, he is at least at moderate risk for complications without adequate follow up.  This format is felt to be most appropriate for this patient at this time.  The patient did not have access to video technology/had technical difficulties with video requiring transitioning to audio format only (telephone).  All issues noted in this document were discussed and addressed.  No physical exam could be performed with this format.  Please refer to the patient's chart for his consent to telehealth for [facility].  Evaluation Performed:  Preoperative cardiovascular risk assessment _____________   Date:  08/04/2022   Patient ID:  Jimmy Black, DOB [birth year], MRN [id number] Patient Location:  Home Provider location:   Office  Primary Care Provider:  Kristian Covey, MD Primary Cardiologist:  Norman Herrlich, MD  Chief Complaint / Patient Profile   62 y.o. y/o male with a h/o PAF, CHADsVASc score of 1 (HTN), sleep apnea, HTN, HLD, mild ascending aortic dilatation who is pending left total hip arthroplasty and presents today for telephonic preoperative cardiovascular risk assessment.  History of Present Illness    Jimmy Black is a 62 y.o. male who presents via audio/video conferencing for a telehealth visit today.  Pt was last seen in cardiology clinic on 09/16/21 by Dr. Dulce Sellar.  At that time AMEET GEDEON was doing well.  The patient is now pending procedure as outlined above. Since his last visit, he denies chest pain, shortness of breath, lower extremity edema, fatigue, palpitations, melena, hematuria, hemoptysis, diaphoresis, weakness, presyncope, syncope, orthopnea, and PND. He is very active with swimming, yard and house work and has had no concerning cardiac symptoms.   Past Medical History    Past Medical History:  Diagnosis Date   Alcohol abuse    Allergic  rhinitis 06/21/2015   Anxiety    takes Paxil daily   Anxiety state 03/07/2009   Qualifier: Diagnosis of  By: Moreno-Coll  MD, Adlih     Arthritis of right knee 11/16/2011   Atrial fibrillation ([facility]) 11/16/2018   Chicken pox    Chronic neuropathic pain 06/11/2019   Family history of anesthesia complication    brother got sick after anesthesia   History of alcohol abuse 08/17/2011   Hyperlipidemia    takes Fish Oil daily   Hypertension 03/20/2018   Joint pain    Joint swelling    KNEE PAIN, RIGHT 03/07/2009   Qualifier: Diagnosis of  By: Moreno-Coll  MD, Adlih     Numbness    both leg and feet   Obstructive sleep apnea 06/21/2015   Osteoarthritis    "right knee; both hips" (12/03/2012)   Palpitations 11/16/2018   Peripheral neuropathy    Sleep apnea    not started cpap as of 5-6 -2021 pv    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 Bilateral   Splenic laceration 12/21/2016   Past Surgical History:  Procedure Laterality Date   EXCISIONAL TOTAL KNEE ARTHROPLASTY Right 12/03/2012   Procedure: EXCISIONAL TOTAL KNEE ARTHROPLASTY (POLY SWAP), SCAR TISSUE REMOVAL, QUADRICEPSPLASTY;  Surgeon: Dannielle Huh, MD;  Location[facility];  Service: Orthopedics;  Laterality: Right;   INCISION AND DRAINAGE OF WOUND Right 2006   "w/jet lavage; had to do this twice after the OR" (12/03/2012)   KNEE SURGERY Right 1967, 1973, 1980   "benign tumor removal" (12/03/2012)   SHOULDER ARTHROSCOPY WITH OPEN  ROTATOR CUFF REPAIR Right 2006   SHOULDER ARTHROSCOPY WITH OPEN ROTATOR CUFF REPAIR Left 06/2012   SPERMATOCELECTOMY     TOTAL KNEE ARTHROPLASTY  11/16/2011   Procedure: TOTAL KNEE ARTHROPLASTY;  Surgeon: Nestor Lewandowsky, MD;  Location: [facility];  Service: Orthopedics;  Laterality: Right;  right total knee arthroplasty   TOTAL KNEE REVISION WITH SCAR DEBRIDEMENT/PATELLA REVISION WITH POLY EXCHANGE Right 12/03/2012   "not sure what they did exactly" (10/'13/2014)     Allergies  No Known Allergies  Home Medications    Prior to Admission medications   Medication Sig Start Date End Date Taking? Authorizing Provider  aspirin EC 81 MG tablet Take 81 mg by mouth daily.    [provider]  buprenorphine (SUBUTEX) 8 MG SUBL SL tablet  06/20/19   [provider]  ezetimibe (ZETIA) 10 MG tablet TAKE 1 TABLET BY MOUTH EVERY DAY 07/04/22   Burchette, Elberta Fortis, MD  Garlic 100 MG TABS Take 1 tablet by mouth 3 (three) times daily.     [provider]  ibuprofen (ADVIL,MOTRIN) 200 MG tablet Take 600 mg by mouth daily as needed for pain.    [provider]  MAGNESIUM PO Take by mouth. 300-400 mg daily    [provider]  Multiple Vitamins-Minerals (MENS MULTI VITAMIN & MINERAL PO) Take 1 tablet by mouth daily.     [provider]  niacin 500 MG tablet Take 500 mg by mouth at bedtime.    [provider]  Omega-3 Fatty Acids (FISH OIL CONCENTRATE PO) Take 1 capsule by mouth 3 (three) times daily.     [provider]  PARoxetine (PAXIL) 20 MG tablet TAKE 1 TABLET BY MOUTH EVERY DAY 03/21/22   Burchette, Elberta Fortis, MD  rosuvastatin (CRESTOR) 20 MG tablet TAKE 1 TABLET BY MOUTH EVERY DAY 04/08/22   Burchette, Elberta Fortis, MD  telmisartan (MICARDIS) 20 MG tablet Take 1 tablet (20 mg total) by mouth daily. 02/08/22   Georgeanna Lea, MD    Physical Exam    Vital Signs:  Vikram Stauffacher Black does not have vital signs available for review today.  Given telephonic nature of communication, physical exam is limited. AAOx3. NAD. Normal affect.  Speech and respirations are unlabored.  Accessory Clinical Findings    None  Assessment & Plan    1.  Preoperative Cardiovascular Risk Assessment: According to the Revised Cardiac Risk Index (RCRI), his Perioperative Risk of Major Cardiac Event is (%): 0.9. His Functional Capacity in METs is: 7.59 according to the Duke Activity Status Index (DASI). The patient  is doing well from a cardiac perspective. Therefore, based on ACC/AHA guidelines, the patient would be at acceptable risk for the planned procedure without further cardiovascular testing.   The patient was advised that if he develops new symptoms prior to surgery to contact our office to arrange for a follow-up visit, and he verbalized understanding.  Per office protocol, if patient is without any new symptoms or concerns at the time of their virtual visit, he may hold aspirin for 5-7 days prior to procedure. Please resume Aspirin as soon as possible postprocedure, at the discretion of the surgeon.   A copy of this note will be routed to requesting surgeon.  Time:   Today, I have spent 10 minutes with the patient with telehealth technology discussing medical history, symptoms, and management plan.     Levi Aland, NP-C  08/05/2022, 2:34 PM [address], [address] Office (  [phone] Fax [phone]

## 2022-08-05 ENCOUNTER — Ambulatory Visit: Payer: Medicare PPO | Attending: Cardiology | Admitting: Nurse Practitioner

## 2022-08-05 ENCOUNTER — Encounter: Payer: Self-pay | Admitting: Nurse Practitioner

## 2022-08-05 DIAGNOSIS — Z01810 Encounter for preprocedural cardiovascular examination: Secondary | ICD-10-CM

## 2022-08-08 ENCOUNTER — Encounter: Payer: Self-pay | Admitting: Family Medicine

## 2022-08-08 ENCOUNTER — Ambulatory Visit: Payer: Medicare PPO | Admitting: Family Medicine

## 2022-08-19 ENCOUNTER — Ambulatory Visit: Payer: Medicare PPO | Admitting: Family Medicine

## 2022-08-19 ENCOUNTER — Telehealth: Payer: Medicare PPO

## 2022-08-23 DIAGNOSIS — M545 Low back pain, unspecified: Secondary | ICD-10-CM | POA: Diagnosis not present

## 2022-08-23 DIAGNOSIS — G629 Polyneuropathy, unspecified: Secondary | ICD-10-CM | POA: Diagnosis not present

## 2022-08-23 DIAGNOSIS — G894 Chronic pain syndrome: Secondary | ICD-10-CM | POA: Diagnosis not present

## 2022-08-23 DIAGNOSIS — M1612 Unilateral primary osteoarthritis, left hip: Secondary | ICD-10-CM | POA: Diagnosis not present

## 2022-08-23 DIAGNOSIS — Z79891 Long term (current) use of opiate analgesic: Secondary | ICD-10-CM | POA: Diagnosis not present

## 2022-09-03 ENCOUNTER — Encounter: Payer: Self-pay | Admitting: Cardiology

## 2022-09-04 ENCOUNTER — Encounter: Payer: Self-pay | Admitting: Cardiology

## 2022-09-06 ENCOUNTER — Encounter: Payer: Self-pay | Admitting: Family Medicine

## 2022-09-06 ENCOUNTER — Ambulatory Visit: Payer: Medicare PPO | Admitting: Family Medicine

## 2022-09-06 ENCOUNTER — Telehealth: Payer: Self-pay | Admitting: Cardiology

## 2022-09-06 VITALS — BP 162/80 | HR 60 | Temp 98.7°F | Ht 77.56 in | Wt 251.9 lb

## 2022-09-06 DIAGNOSIS — R600 Localized edema: Secondary | ICD-10-CM | POA: Diagnosis not present

## 2022-09-06 DIAGNOSIS — G609 Hereditary and idiopathic neuropathy, unspecified: Secondary | ICD-10-CM | POA: Diagnosis not present

## 2022-09-06 DIAGNOSIS — L97519 Non-pressure chronic ulcer of other part of right foot with unspecified severity: Secondary | ICD-10-CM | POA: Diagnosis not present

## 2022-09-06 MED ORDER — DOXYCYCLINE HYCLATE 100 MG PO CAPS: 100.0000 mg | ORAL_CAPSULE | Freq: Two times a day (BID) | ORAL | 0 refills | Status: DC

## 2022-09-06 NOTE — Telephone Encounter (Signed)
 Patient states that he was returning call. Please advise

## 2022-09-06 NOTE — Patient Instructions (Addendum)
Start the antibiotic tonight.  I have ordered the venous doppler for the right leg  I am placing referral to podiatrist.   Get back on your BP medication.

## 2022-09-06 NOTE — Progress Notes (Signed)
Established Patient Office Visit  Subjective   Patient ID: Jimmy Black, male    DOB: 31-May-1960  Age: 62 y.o. MRN: [id number]  Chief Complaint  Patient presents with   Leg Swelling    HPI   Jimmy Black has history of atrial fibrillation, hypertension, obstructive sleep apnea, peripheral neuropathy who is seen today with 2-day history of right lower extremity swelling.  He noticed some blood draining from his right great toe for past 4 to 5 days and has been applying some topical antibiotic.  No history of diabetes.  Does have chronic neuropathy and does not feel much in the way of any toe pain.  He was concerned because a little bit of warmth in the toe and foot as well.  No history of DVT.  No dyspnea.  No chest pains.  He is supposed to be getting hip arthroplasty surgery in about a month. Multiple prior surgeries right knee but none recently  Past Medical History:  Diagnosis Date   Alcohol abuse    Allergic rhinitis 06/21/2015   Anxiety    takes Paxil daily   Anxiety state 03/07/2009   Qualifier: Diagnosis of  By: Moreno-Coll  MD, Adlih     Arthritis of right knee 11/16/2011   Atrial fibrillation (HCC) 11/16/2018   Chicken pox    Chronic neuropathic pain 06/11/2019   Family history of anesthesia complication    brother got sick after anesthesia   History of alcohol abuse 08/17/2011   Hyperlipidemia    takes Fish Oil daily   Hypertension 03/20/2018   Joint pain    Joint swelling    KNEE PAIN, RIGHT 03/07/2009   Qualifier: Diagnosis of  By: Moreno-Coll  MD, Adlih     Numbness    both leg and feet   Obstructive sleep apnea 06/21/2015   Osteoarthritis    "right knee; both hips" (12/03/2012)   Palpitations 11/16/2018   Peripheral neuropathy    Sleep apnea    not started cpap as of 5-6 -2021 pv    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 Bilateral   Splenic laceration 12/21/2016   Past Surgical History:  Procedure  Laterality Date   EXCISIONAL TOTAL KNEE ARTHROPLASTY Right 12/03/2012   Procedure: EXCISIONAL TOTAL KNEE ARTHROPLASTY (POLY SWAP), SCAR TISSUE REMOVAL, QUADRICEPSPLASTY;  Surgeon: Dannielle Huh, MD;  Location: [facility] OR;  Service: Orthopedics;  Laterality: Right;   INCISION AND DRAINAGE OF WOUND Right 2006   "w/jet lavage; had to do this twice after the OR" (12/03/2012)   KNEE SURGERY Right 1967, 1973, 1980   "benign tumor removal" (12/03/2012)   SHOULDER ARTHROSCOPY WITH OPEN ROTATOR CUFF REPAIR Right 2006   SHOULDER ARTHROSCOPY WITH OPEN ROTATOR CUFF REPAIR Left 06/2012   SPERMATOCELECTOMY     TOTAL KNEE ARTHROPLASTY  11/16/2011   Procedure: TOTAL KNEE ARTHROPLASTY;  Surgeon: Nestor Lewandowsky, MD;  Location: [facility] OR;  Service: Orthopedics;  Laterality: Right;  right total knee arthroplasty   TOTAL KNEE REVISION WITH SCAR DEBRIDEMENT/PATELLA REVISION WITH POLY EXCHANGE Right 12/03/2012   "not sure what they did exactly" (10/'13/2014)    reports that he has never smoked. His smokeless tobacco use includes snuff and chew. He reports that he does not currently use alcohol. He reports that he does not use drugs. family history includes Alcohol abuse in his paternal aunt and paternal grandfather; Breast cancer in his cousin, maternal aunt, and mother; Cancer in his father and maternal aunt; Cancer (  age of onset: 33) in his mother; Heart disease in his father, maternal grandfather, and paternal grandfather; Hyperlipidemia in his brother and mother; Prostate cancer in his father. No Known Allergies  Review of Systems  Constitutional:  Negative for chills and fever.  Respiratory:  Negative for hemoptysis and shortness of breath.   Cardiovascular:  Negative for chest pain.      Objective:     BP (!) 162/80 (BP Location: Left Arm, Cuff Size: Normal)   Pulse 60   Temp 98.7 F (37.1 C) (Oral)   Ht 6' 5.56" (1.97 m)   Wt 251 lb 14.4 oz (114.3 kg)   SpO2 99%   BMI 29.44 kg/m    Physical Exam Vitals  reviewed.  Constitutional:      General: He is not in acute distress.    Appearance: Normal appearance.  Cardiovascular:     Rate and Rhythm: Normal rate and regular rhythm.  Pulmonary:     Effort: Pulmonary effort is normal.     Breath sounds: Normal breath sounds. No wheezing or rales.  Musculoskeletal:     Comments: He has some asymmetric edema right lower extremity compared to the left.  No calf tenderness.  Skin:    Comments: 1 x 1 cm superficial ulcer involving the distal volar aspect of the right great toe.  No odor.  No purulent drainage.  No necrotic tissue.  Relatively dry. Right great toe is slightly warm to touch and mild erythema and mild edema  Neurological:     Mental Status: He is alert.     Comments: He has significant impairment with sensation to touch right foot      No results found for any visits on 09/06/22.    The 10-year ASCVD risk score (Arnett DK, et al., 2019) is: 10.1%    Assessment & Plan:   Problem List Items Addressed This Visit   None Visit Diagnoses     Leg edema, right    -  Primary   Relevant Orders   VAS [place] LOWER EXTREMITY VENOUS (DVT)   Skin ulcer of right great toe, unspecified ulcer stage (HCC)       Relevant Orders   Ambulatory referral to Podiatry     Patient presents with about 4 to 5-day history of right great toe ulceration and 2-day history of some asymmetric swelling right leg compared to the left.  No history of DVT.  May have some mild and early cellulitis changes of the right great toe and foot.  No fever.  -Start doxycycline 100 mg twice daily for 10 days -Set up podiatry referral.  May have some challenges with getting this healed secondary to his neuropathy.  Need podiatry input.  Fortunately, he has no history of diabetes. -Set up venous Doppler right lower extremity -Follow-up immediately for any shortness of breath, chest pain, fever, or other concerns  No follow-ups on file.    Evelena Peat, MD

## 2022-09-07 ENCOUNTER — Encounter: Payer: Self-pay | Admitting: Family Medicine

## 2022-09-08 ENCOUNTER — Encounter: Payer: Self-pay | Admitting: Family Medicine

## 2022-09-09 ENCOUNTER — Encounter: Payer: Self-pay | Admitting: Family Medicine

## 2022-09-09 DIAGNOSIS — R600 Localized edema: Secondary | ICD-10-CM

## 2022-09-09 NOTE — Telephone Encounter (Signed)
Stat doppler order placed and patient was informed via Mychart message in previous encounter

## 2022-09-12 NOTE — Telephone Encounter (Signed)
Called patient and he reported that he had some swelling in his right leg and he went to his PCP and they ordered a venous doppler study. Patient was appreciative for the call and had no further questions at this time.

## 2022-09-12 NOTE — Telephone Encounter (Signed)
Urgent referral was placed on 09/09/2022 and referral coordinator was notified. Referral coordinator has attempted to reach out to patient for scheduling

## 2022-09-13 ENCOUNTER — Encounter: Payer: Self-pay | Admitting: Family Medicine

## 2022-09-13 ENCOUNTER — Ambulatory Visit (HOSPITAL_COMMUNITY)
Admission: RE | Admit: 2022-09-13 | Discharge: 2022-09-13 | Disposition: A | Discharge status: Home / Self Care | Payer: Medicare PPO | Source: Ambulatory Visit | Attending: Family Medicine | Admitting: Family Medicine

## 2022-09-13 DIAGNOSIS — R600 Localized edema: Secondary | ICD-10-CM | POA: Diagnosis not present

## 2022-09-14 ENCOUNTER — Encounter: Payer: Self-pay | Admitting: Podiatry

## 2022-09-15 DIAGNOSIS — Z79891 Long term (current) use of opiate analgesic: Secondary | ICD-10-CM | POA: Diagnosis not present

## 2022-09-15 DIAGNOSIS — G894 Chronic pain syndrome: Secondary | ICD-10-CM | POA: Diagnosis not present

## 2022-09-15 DIAGNOSIS — M545 Low back pain, unspecified: Secondary | ICD-10-CM | POA: Diagnosis not present

## 2022-09-15 DIAGNOSIS — M1612 Unilateral primary osteoarthritis, left hip: Secondary | ICD-10-CM | POA: Diagnosis not present

## 2022-09-15 DIAGNOSIS — G629 Polyneuropathy, unspecified: Secondary | ICD-10-CM | POA: Diagnosis not present

## 2022-09-17 NOTE — Progress Notes (Signed)
Patient was a no-show for today's scheduled appointment.

## 2022-09-27 NOTE — Progress Notes (Signed)
Sent message, via epic in basket, requesting orders in epic from surgeon.  

## 2022-09-30 NOTE — Progress Notes (Signed)
Please send preop orders for PST appointment 10/04/22

## 2022-09-30 NOTE — Progress Notes (Signed)
 Second request for pre op orders in [facility]: Left a voicemail for [employer] at [facility].

## 2022-09-30 NOTE — Progress Notes (Signed)
COVID Vaccine received:  [x]  No []  Yes Date of any COVID positive Test in last 90 days: No PCP - Evelena Peat MD Cardiologist - Norman Herrlich MD  Chest x-ray -  EKG -   Stress Test -  ECHO - 04/01/19 Epic Cardiac Cath -   Cardiac clearance 08/05/22 Eligha Bridegroom NP-C Medical clearance 08/08/22 Dr. Genelle Bal Burchette  Bowel Prep - [x]  No  []   Yes ______  Pacemaker / ICD device [x]  No []  Yes   Spinal Cord Stimulator:[x]  No []  Yes       History of Sleep Apnea? []  No [x]  Yes   CPAP used?- [x]  No []  Yes    Does the patient monitor blood sugar?          [x]  No []  Yes  []  N/A  Patient has: [x]  NO Hx DM   []  Pre-DM                 []  DM1  []   DM2 Does patient have a [employer] or [employer]? []  No []  Yes   Fasting Blood Sugar Ranges-  Checks Blood Sugar _____ times a day  GLP1 agonist / usual dose - No GLP1 instructions:  SGLT-2 inhibitors / usual dose - No SGLT-2 instructions:   Blood Thinner / Instructions: Aspirin Instructions:81 mg ASA Stop 1 week prior to surgery per MD  Comments:   Activity level: Patient is able to climb a flight of stairs without difficulty; [x]  No CP  [x]  No SOB,Patient can  perform ADLs without assistance.   Anesthesia review:   Patient denies shortness of breath, fever, cough and chest pain at PAT appointment.  Patient verbalized understanding and agreement to the Pre-Surgical Instructions that were given to them at this PAT appointment. Patient was also educated of the need to review these PAT instructions again prior to his/her surgery.I reviewed the appropriate phone numbers to call if they have any and questions or concerns.

## 2022-10-04 ENCOUNTER — Other Ambulatory Visit: Payer: Self-pay

## 2022-10-04 ENCOUNTER — Encounter (HOSPITAL_COMMUNITY): Payer: Self-pay

## 2022-10-04 ENCOUNTER — Encounter: Payer: Self-pay | Admitting: Family Medicine

## 2022-10-04 ENCOUNTER — Ambulatory Visit: Payer: Self-pay | Admitting: Student

## 2022-10-04 ENCOUNTER — Encounter: Payer: Self-pay | Admitting: Cardiology

## 2022-10-04 ENCOUNTER — Encounter (HOSPITAL_COMMUNITY)
Admission: RE | Admit: 2022-10-04 | Discharge: 2022-10-04 | Disposition: A | Discharge status: Home / Self Care | Payer: Medicare PPO | Source: Ambulatory Visit | Attending: Orthopedic Surgery | Admitting: Orthopedic Surgery

## 2022-10-04 VITALS — BP 160/100 | HR 58 | Temp 98.6°F | Resp 16 | Ht 78.0 in | Wt 226.0 lb

## 2022-10-04 DIAGNOSIS — I10 Essential (primary) hypertension: Secondary | ICD-10-CM

## 2022-10-04 DIAGNOSIS — Z01818 Encounter for other preprocedural examination: Secondary | ICD-10-CM | POA: Diagnosis not present

## 2022-10-04 HISTORY — DX: Cardiac arrhythmia, unspecified: I49.9

## 2022-10-04 LAB — TYPE AND SCREEN
ABO/RH(D): O NEG
Antibody Screen: NEGATIVE

## 2022-10-04 LAB — BASIC METABOLIC PANEL
Anion gap: 8 (ref 5–15)
BUN: 21 mg/dL — ABNORMAL HIGH (ref 6–20)
CO2: 23 mmol/L (ref 22–32)
Calcium: 9.1 mg/dL (ref 8.9–10.3)
Chloride: 104 mmol/L (ref 98–111)
Creatinine, Ser: 0.73 mg/dL (ref 0.61–1.24)
GFR, Estimated: >60 mL/min (ref >60)
Glucose, Bld: 93 mg/dL (ref 70–99)
Potassium: 3.9 mmol/L (ref 3.5–5.1)
Sodium: 135 mmol/L (ref 135–145)

## 2022-10-04 LAB — CBC
HCT: 35.5 % — ABNORMAL LOW (ref 39.0–52.0)
Hemoglobin: 11.2 g/dL — ABNORMAL LOW (ref 13.0–17.0)
MCH: 25.6 pg — ABNORMAL LOW (ref 26.0–34.0)
MCHC: 31.5 g/dL (ref 30.0–36.0)
MCV: 81.2 fL (ref 80.0–100.0)
Platelets: 280 10*3/uL (ref 150–400)
RBC: 4.37 MIL/uL (ref 4.22–5.81)
RDW: 16 % — ABNORMAL HIGH (ref 11.5–15.5)
WBC: 8.3 10*3/uL (ref 4.0–10.5)
nRBC: 0 % (ref 0.0–0.2)

## 2022-10-04 LAB — SURGICAL PCR SCREEN
MRSA, PCR: NEGATIVE
Staphylococcus aureus: NEGATIVE

## 2022-10-04 NOTE — H&P (Signed)
TOTAL HIP ADMISSION H&P  Patient is admitted for left total hip arthroplasty.  Subjective:  Chief Complaint: left hip pain  HPI: Jimmy Black, 62 y.o. male, has a history of pain and functional disability in the left hip(s) due to arthritis and patient has failed non-surgical conservative treatments for greater than 12 weeks to include NSAID's and/or analgesics, flexibility and strengthening excercises, and activity modification.  Onset of symptoms was gradual starting >10 years ago with rapidlly worsening course since that time.The patient noted no past surgery on the left hip(s).  Patient currently rates pain in the left hip at 10 out of 10 with activity. Patient has night pain, worsening of pain with activity and weight bearing, trendelenberg gait, pain that interfers with activities of daily living, and pain with passive range of motion. Patient has evidence of subchondral cysts, subchondral sclerosis, periarticular osteophytes, and joint space narrowing by imaging studies. This condition presents safety issues increasing the risk of falls.  There is no current active infection.  Patient Active Problem List   Diagnosis Date Noted   Spermatocele 09/09/2021   Sleep apnea    Peripheral neuropathy    Osteoarthritis    Numbness    Joint swelling    Joint pain    Family history of anesthesia complication    Chicken pox    Anxiety    Alcohol abuse    Chronic neuropathic pain 06/11/2019   Atrial fibrillation (HCC) 11/16/2018   Palpitations 11/16/2018   Hypertension 03/20/2018   Hyperlipidemia 06/09/2017   Splenic laceration 12/21/2016   Obstructive sleep apnea 06/21/2015   Allergic rhinitis 06/21/2015   Arthritis of right knee 11/16/2011   History of alcohol abuse 08/17/2011   Anxiety state 03/07/2009   KNEE PAIN, RIGHT 03/07/2009   Past Medical History:  Diagnosis Date   Alcohol abuse    Allergic rhinitis 06/21/2015   Anxiety    takes Paxil daily   Anxiety state  03/07/2009   Qualifier: Diagnosis of  By: Moreno-Coll  MD, Adlih     Arthritis of right knee 11/16/2011   Atrial fibrillation (HCC) 11/16/2018   Chicken pox    Chronic neuropathic pain 06/11/2019   Dysrhythmia    Family history of anesthesia complication    brother got sick after anesthesia   History of alcohol abuse 08/17/2011   Hyperlipidemia    takes Fish Oil daily   Hypertension 03/20/2018   Joint pain    Joint swelling    KNEE PAIN, RIGHT 03/07/2009   Qualifier: Diagnosis of  By: Moreno-Coll  MD, Adlih     Numbness    both leg and feet   Obstructive sleep apnea 06/21/2015   Osteoarthritis    "right knee; both hips" (12/03/2012)   Palpitations 11/16/2018   Peripheral neuropathy    Sleep apnea    not started cpap as of 5-6 -2021 pv    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 Bilateral   Splenic laceration 12/21/2016    Past Surgical History:  Procedure Laterality Date   EXCISIONAL TOTAL KNEE ARTHROPLASTY Right 12/03/2012   Procedure: EXCISIONAL TOTAL KNEE ARTHROPLASTY (POLY SWAP), SCAR TISSUE REMOVAL, QUADRICEPSPLASTY;  Surgeon: Dannielle Huh, MD;  Location: [facility] OR;  Service: Orthopedics;  Laterality: Right;   INCISION AND DRAINAGE OF WOUND Right 2006   "w/jet lavage; had to do this twice after the OR" (12/03/2012)   KNEE SURGERY Right 1967, 1973, 1980   "benign tumor removal" (12/03/2012)   SHOULDER  ARTHROSCOPY WITH OPEN ROTATOR CUFF REPAIR Right 2006   SHOULDER ARTHROSCOPY WITH OPEN ROTATOR CUFF REPAIR Left 06/2012   SPERMATOCELECTOMY     TOTAL KNEE ARTHROPLASTY  11/16/2011   Procedure: TOTAL KNEE ARTHROPLASTY;  Surgeon: Nestor Lewandowsky, MD;  Location: [facility] OR;  Service: Orthopedics;  Laterality: Right;  right total knee arthroplasty   TOTAL KNEE REVISION WITH SCAR DEBRIDEMENT/PATELLA REVISION WITH POLY EXCHANGE Right 12/03/2012   "not sure what they did exactly" (10/'13/2014)    Current Outpatient Medications  Medication Sig  Dispense Refill Last Dose   aspirin EC 81 MG tablet Take 81 mg by mouth daily.      buprenorphine (SUBUTEX) 8 MG SUBL SL tablet Place 8 mg under the tongue 4 (four) times daily.      docusate sodium (COLACE) 100 MG capsule Take 200 mg by mouth daily as needed for mild constipation.      ezetimibe (ZETIA) 10 MG tablet TAKE 1 TABLET BY MOUTH EVERY DAY 90 tablet 0    ibuprofen (ADVIL,MOTRIN) 200 MG tablet Take 600 mg by mouth 2 (two) times daily as needed for pain.      Multiple Vitamins-Minerals (MENS MULTI VITAMIN & MINERAL PO) Take 1 tablet by mouth daily.       neomycin-bacitracin-polymyxin (NEOSPORIN) OINT Apply 1 Application topically as needed for wound care.      Omega-3 Fatty Acids (FISH OIL) 500 MG CAPS Take 500 mg by mouth 2 (two) times daily.      PARoxetine (PAXIL) 20 MG tablet TAKE 1 TABLET BY MOUTH EVERY DAY 90 tablet 3    rosuvastatin (CRESTOR) 20 MG tablet TAKE 1 TABLET BY MOUTH EVERY DAY 90 tablet 1    telmisartan (MICARDIS) 20 MG tablet Take 1 tablet (20 mg total) by mouth daily. 90 tablet 3    No current facility-administered medications for this visit.   No Known Allergies  Social History   Tobacco Use   Smoking status: Never   Smokeless tobacco: Current    Types: Snuff, Chew  Substance Use Topics   Alcohol use: Not Currently    Comment: 12/03/2012 "quit all alcohol in 1998; family hx of problems w/it"    Family History  Problem Relation Age of Onset   Cancer Mother 68       breast   Hyperlipidemia Mother    Breast cancer Mother    Heart disease Father    Cancer Father        prostate   Prostate cancer Father    Hyperlipidemia Brother    Cancer Maternal Aunt        breast   Breast cancer Maternal Aunt    Alcohol abuse Paternal Aunt    Heart disease Maternal Grandfather    Alcohol abuse Paternal Grandfather    Heart disease Paternal Grandfather    Breast cancer Cousin    Colon cancer Neg Hx    Colon polyps Neg Hx    Esophageal cancer Neg Hx    Rectal  cancer Neg Hx    Stomach cancer Neg Hx      Review of Systems  Musculoskeletal:  Positive for arthralgias and gait problem.    Objective:  Physical Exam Constitutional:      Appearance: Normal appearance.  HENT:     Head: Normocephalic and atraumatic.     Nose: Nose normal.     Mouth/Throat:     Mouth: Mucous membranes are moist.     Pharynx: Oropharynx is clear.  Eyes:  Conjunctiva/sclera: Conjunctivae normal.  Cardiovascular:     Rate and Rhythm: Normal rate and regular rhythm.     Pulses: Normal pulses.     Heart sounds: Normal heart sounds.  Pulmonary:     Effort: Pulmonary effort is normal.     Breath sounds: Normal breath sounds.  Abdominal:     General: Abdomen is flat.     Palpations: Abdomen is soft.  Genitourinary:    Comments: Deferred. Musculoskeletal:     Cervical back: Normal range of motion and neck supple.     Comments: Examination of the left hip reveals no skin wounds or lesions. He has severe pain and stiffness with flexion and rotation of the hip. I can flex him to 90. He is in external rotation at baseline, no internal rotation. Trochanteric tenderness to palpation.   Sensory changes noted at baseline, otherwise motor function intact in LE bilaterally. Distal pedal pulses 2+ bilaterally.   No significant pedal edema. Calves soft and non-tender.   Skin:    General: Skin is warm and dry.     Capillary Refill: Capillary refill takes less than 2 seconds.  Neurological:     General: No focal deficit present.     Mental Status: He is alert and oriented to person, place, and time.  Psychiatric:        Mood and Affect: Mood normal.        Behavior: Behavior normal.        Thought Content: Thought content normal.        Judgment: Judgment normal.     Vital signs in last 24 hours: @VSRANGES @  Labs:   Estimated body mass index is 26.12 kg/m as calculated from the following:   Height as of an earlier encounter on 10/04/22: 6\' 6"  (1.981 m).    Weight as of an earlier encounter on 10/04/22: 102.5 kg.   Imaging Review Plain radiographs demonstrate severe degenerative joint disease of the left hip(s). The bone quality appears to be adequate for age and reported activity level.      Assessment/Plan:  End stage arthritis, left hip(s)  The patient history, physical examination, clinical judgement of the provider and imaging studies are consistent with end stage degenerative joint disease of the left hip(s) and total hip arthroplasty is deemed medically necessary. The treatment options including medical management, injection therapy, arthroscopy and arthroplasty were discussed at length. The risks and benefits of total hip arthroplasty were presented and reviewed. The risks due to aseptic loosening, infection, stiffness, dislocation/subluxation,  thromboembolic complications and other imponderables were discussed.  The patient acknowledged the explanation, agreed to proceed with the plan and consent was signed. Patient is being admitted for inpatient treatment for surgery, pain control, PT, OT, prophylactic antibiotics, VTE prophylaxis, progressive ambulation and ADL's and discharge planning.The patient is planning to be discharged home with HEP after an overnight stay.   Therapy Plans: HEP.  Disposition: Home with daughter and brother Planned DVT Prophylaxis: aspirin 81mg  BID DME needed: walker.  PCP: Cleared. Cardiology: Cleared.  TXA: IV Allergies: NDKA.  Anesthesia Concerns: None.  BMI: 25.9 Last HgbA1c: Not diabetic. Other: - A-fib, aspirin only.  - Neuropathy issues, pain management with buprenorphine. 8mg  subq 4x/day.  - History Rt TKA, outside facility.  - Ulcer on right great toe. Patient will let us know if not healed prior to, currently healing and being treated with abx.  - Pain medication?? dilaudid?, zofran, methocarbamol, has meloxicam.  - Labs pending.     Patient's  anticipated LOS is less than 2 midnights,  meeting these requirements: - Younger than 97 - Lives within 1 hour of care - Has a competent adult at home to recover with post-op recover - NO history of  - Chronic pain requiring opiods  - Diabetes  - Coronary Artery Disease  - Heart failure  - Heart attack  - Stroke  - DVT/VTE  - Cardiac arrhythmia  - Respiratory Failure/COPD  - Renal failure  - Anemia  - Advanced Liver disease

## 2022-10-04 NOTE — Patient Instructions (Addendum)
SURGICAL WAITING ROOM VISITATION  Patients having surgery or a procedure may have no more than 2 support people in the waiting area - these visitors may rotate.    Children under the age of 32 must have an adult with them who is not the patient.  Due to an increase in RSV and influenza rates and associated hospitalizations, children ages 27 and under may not visit patients in [facility] hospitals.  If the patient needs to stay at the hospital during part of their recovery, the visitor guidelines for inpatient rooms apply. Pre-op nurse will coordinate an appropriate time for 1 support person to accompany patient in pre-op.  This support person may not rotate.    Please refer to the [facility] website for the visitor guidelines for Inpatients (after your surgery is over and you are in a regular room).       Your procedure is scheduled on: 10/13/22   Report to  [facility] Main Entrance    Report to admitting at 5:15  AM   Call this number if you have problems the morning of surgery [phone]   Do not eat food or drink liquids :After Midnight.         Oral Hygiene is also important to reduce your risk of infection.                                    Remember - BRUSH YOUR TEETH THE MORNING OF SURGERY WITH YOUR REGULAR TOOTHPASTE   Stop all vitamins and herbal supplements 7 days before surgery.   Take these medicines the morning of surgery with A SIP OF WATER: Subutex, Ezetimibe, Paxil and Rosuvastatin             You may not have any metal on your body including hair pins, jewelry, and body piercing             Do not wear make-up, lotions, powders,cologne, or deodorant              Men may shave face and neck.   Do not bring valuables to the hospital. [place] IS NOT             RESPONSIBLE   FOR VALUABLES.   Contacts, glasses, dentures or bridgework may not be worn into surgery.   Bring small overnight bag day of surgery.   DO NOT BRING YOUR HOME  MEDICATIONS TO THE HOSPITAL. PHARMACY WILL DISPENSE MEDICATIONS LISTED ON YOUR MEDICATION LIST TO YOU DURING YOUR ADMISSION IN THE HOSPITAL!    Patients discharged on the day of surgery will not be allowed to drive home.  Someone NEEDS to stay with you for the first 24 hours after anesthesia.   Special Instructions: Bring a copy of your healthcare power of attorney and living will documents the day of surgery if you haven't scanned them before.              Please read over the following fact sheets you were given: IF YOU HAVE QUESTIONS ABOUT YOUR PRE-OP INSTRUCTIONS PLEASE CALL [phone]   If you received a COVID test during your pre-op visit  it is requested that you wear a mask when out in public, stay away from anyone that may not be feeling well and notify your surgeon if you develop symptoms. If you test positive for Covid or have been in contact with anyone that has tested positive  in the last 10 days please notify you surgeon.      Pre-operative 5 CHG Bath Instructions   You can play a key role in reducing the risk of infection after surgery. Your skin needs to be as free of germs as possible. You can reduce the number of germs on your skin by washing with CHG (chlorhexidine gluconate) soap before surgery. CHG is an antiseptic soap that kills germs and continues to kill germs even after washing.   DO NOT use if you have an allergy to chlorhexidine/CHG or antibacterial soaps. If your skin becomes reddened or irritated, stop using the CHG and notify one of our RNs at [phone].   Please shower with the CHG soap starting 4 days before surgery using the following schedule:     Please keep in mind the following:  DO NOT shave, including legs and underarms, starting the day of your first shower.   You may shave your face at any point before/day of surgery.  Place clean sheets on your bed the day you start using CHG soap. Use a clean washcloth (not used since being washed) for each  shower. DO NOT sleep with pets once you start using the CHG.   CHG Shower Instructions:  If you choose to wash your hair and private area, wash first with your normal shampoo/soap.  After you use shampoo/soap, rinse your hair and body thoroughly to remove shampoo/soap residue.  Turn the water OFF and apply about 3 tablespoons (45 ml) of CHG soap to a CLEAN washcloth.  Apply CHG soap ONLY FROM YOUR NECK DOWN TO YOUR TOES (washing for 3-5 minutes)  DO NOT use CHG soap on face, private areas, open wounds, or sores.  Pay special attention to the area where your surgery is being performed.  If you are having back surgery, having someone wash your back for you may be helpful. Wait 2 minutes after CHG soap is applied, then you may rinse off the CHG soap.  Pat dry with a clean towel  Put on clean clothes/pajamas   If you choose to wear lotion, please use ONLY the CHG-compatible lotions on the back of this paper.     Additional instructions for the day of surgery: DO NOT APPLY any lotions, deodorants, cologne, or perfumes.   Put on clean/comfortable clothes.  Brush your teeth.  Ask your nurse before applying any prescription medications to the skin.      CHG Compatible Lotions   Aveeno Moisturizing lotion  Cetaphil Moisturizing Cream  Cetaphil Moisturizing Lotion  Clairol Herbal Essence Moisturizing Lotion, Dry Skin  Clairol Herbal Essence Moisturizing Lotion, Extra Dry Skin  Clairol Herbal Essence Moisturizing Lotion, Normal Skin  Curel Age Defying Therapeutic Moisturizing Lotion with Alpha Hydroxy  Curel Extreme Care Body Lotion  Curel Soothing Hands Moisturizing Hand Lotion  Curel Therapeutic Moisturizing Cream, Fragrance-Free  Curel Therapeutic Moisturizing Lotion, Fragrance-Free  Curel Therapeutic Moisturizing Lotion, Original Formula  Eucerin Daily Replenishing Lotion  Eucerin Dry Skin Therapy Plus Alpha Hydroxy Crme  Eucerin Dry Skin Therapy Plus Alpha Hydroxy Lotion   Eucerin Original Crme  Eucerin Original Lotion  Eucerin Plus Crme Eucerin Plus Lotion  Eucerin TriLipid Replenishing Lotion  Keri Anti-Bacterial Hand Lotion  Keri Deep Conditioning Original Lotion Dry Skin Formula Softly Scented  Keri Deep Conditioning Original Lotion, Fragrance Free Sensitive Skin Formula  Keri Lotion Fast Absorbing Fragrance Free Sensitive Skin Formula  Keri Lotion Fast Absorbing Softly Scented Dry Skin Formula  Keri Original Lotion  Keri Skin  Renewal Lotion WellPoint Smooth Lotion  Keri Silky Smooth Sensitive Skin Lotion  Nivea Body Creamy Conditioning Patent examiner Moisturizing Lotion Nivea Crme  Nivea Skin Firming Lotion  NutraDerm 30 Skin Lotion  NutraDerm Skin Lotion  NutraDerm Therapeutic Skin Cream  NutraDerm Therapeutic Skin Lotion  ProShield Protective Hand Cream    WHAT IS A BLOOD TRANSFUSION? Blood Transfusion Information  A transfusion is the replacement of blood or some of its parts. Blood is made up of multiple cells which provide different functions. Red blood cells carry oxygen and are used for blood loss replacement. White blood cells fight against infection. Platelets control bleeding. Plasma helps clot blood. Other blood products are available for specialized needs, such as hemophilia or other clotting disorders. BEFORE THE TRANSFUSION  Who gives blood for transfusions?  Healthy volunteers who are fully evaluated to make sure their blood is safe. This is blood bank blood. Transfusion therapy is the safest it has ever been in the practice of medicine. Before blood is taken from a donor, a complete history is taken to make sure that person has no history of diseases nor engages in risky social behavior (examples are intravenous drug use or sexual activity with multiple partners). The donor's travel history is screened to minimize risk of transmitting infections, such as  malaria. The donated blood is tested for signs of infectious diseases, such as HIV and hepatitis. The blood is then tested to be sure it is compatible with you in order to minimize the chance of a transfusion reaction. If you or a relative donates blood, this is often done in anticipation of surgery and is not appropriate for emergency situations. It takes many days to process the donated blood. RISKS AND COMPLICATIONS Although transfusion therapy is very safe and saves many lives, the main dangers of transfusion include:  Getting an infectious disease. Developing a transfusion reaction. This is an allergic reaction to something in the blood you were given. Every precaution is taken to prevent this. The decision to have a blood transfusion has been considered carefully by your caregiver before blood is given. Blood is not given unless the benefits outweigh the risks. AFTER THE TRANSFUSION Right after receiving a blood transfusion, you will usually feel much better and more energetic. This is especially true if your red blood cells have gotten low (anemic). The transfusion raises the level of the red blood cells which carry oxygen, and this usually causes an energy increase. The nurse administering the transfusion will monitor you carefully for complications. HOME CARE INSTRUCTIONS  No special instructions are needed after a transfusion. You may find your energy is better. Speak with your caregiver about any limitations on activity for underlying diseases you may have. SEEK MEDICAL CARE IF:  Your condition is not improving after your transfusion. You develop redness or irritation at the intravenous (IV) site. SEEK IMMEDIATE MEDICAL CARE IF:  Any of the following symptoms occur over the next 12 hours: Shaking chills. You have a temperature by mouth above 102 F (38.9 C), not controlled by medicine. Chest, back, or muscle pain. People around you feel you are not acting correctly or are  confused. Shortness of breath or difficulty breathing. Dizziness and fainting. You get a rash or develop hives. You have a decrease in urine output. Your urine turns a dark color or changes to pink, red, or brown. Any of the following symptoms occur over the next  10 days: You have a temperature by mouth above 102 F (38.9 C), not controlled by medicine. Shortness of breath. Weakness after normal activity. The white part of the eye turns yellow (jaundice). You have a decrease in the amount of urine or are urinating less often. Your urine turns a dark color or changes to pink, red, or brown. Document Released: 02/05/2000 Document Revised: 05/02/2011 Document Reviewed: 09/24/2007 [facility] Patient Information 2014 [place], [place].

## 2022-10-04 NOTE — H&P (View-Only) (Signed)
 TOTAL HIP ADMISSION H&P  Patient is admitted for left total hip arthroplasty.  Subjective:  Chief Complaint: left hip pain  HPI: Jimmy Black, 62 y.o. male, has a history of pain and functional disability in the left hip(s) due to arthritis and patient has failed non-surgical conservative treatments for greater than 12 weeks to include NSAID's and/or analgesics, flexibility and strengthening excercises, and activity modification.  Onset of symptoms was gradual starting >10 years ago with rapidlly worsening course since that time.The patient noted no past surgery on the left hip(s).  Patient currently rates pain in the left hip at 10 out of 10 with activity. Patient has night pain, worsening of pain with activity and weight bearing, trendelenberg gait, pain that interfers with activities of daily living, and pain with passive range of motion. Patient has evidence of subchondral cysts, subchondral sclerosis, periarticular osteophytes, and joint space narrowing by imaging studies. This condition presents safety issues increasing the risk of falls.  There is no current active infection.  Patient Active Problem List   Diagnosis Date Noted   Spermatocele 09/09/2021   Sleep apnea    Peripheral neuropathy    Osteoarthritis    Numbness    Joint swelling    Joint pain    Family history of anesthesia complication    Chicken pox    Anxiety    Alcohol abuse    Chronic neuropathic pain 06/11/2019   Atrial fibrillation (HCC) 11/16/2018   Palpitations 11/16/2018   Hypertension 03/20/2018   Hyperlipidemia 06/09/2017   Splenic laceration 12/21/2016   Obstructive sleep apnea 06/21/2015   Allergic rhinitis 06/21/2015   Arthritis of right knee 11/16/2011   History of alcohol abuse 08/17/2011   Anxiety state 03/07/2009   KNEE PAIN, RIGHT 03/07/2009   Past Medical History:  Diagnosis Date   Alcohol abuse    Allergic rhinitis 06/21/2015   Anxiety    takes Paxil daily   Anxiety state  03/07/2009   Qualifier: Diagnosis of  By: Moreno-Coll  MD, Adlih     Arthritis of right knee 11/16/2011   Atrial fibrillation (HCC) 11/16/2018   Chicken pox    Chronic neuropathic pain 06/11/2019   Dysrhythmia    Family history of anesthesia complication    brother got sick after anesthesia   History of alcohol abuse 08/17/2011   Hyperlipidemia    takes Fish Oil daily   Hypertension 03/20/2018   Joint pain    Joint swelling    KNEE PAIN, RIGHT 03/07/2009   Qualifier: Diagnosis of  By: Moreno-Coll  MD, Adlih     Numbness    both leg and feet   Obstructive sleep apnea 06/21/2015   Osteoarthritis    "right knee; both hips" (12/03/2012)   Palpitations 11/16/2018   Peripheral neuropathy    Sleep apnea    not started cpap as of 5-6 -2021 pv    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 Bilateral   Splenic laceration 12/21/2016    Past Surgical History:  Procedure Laterality Date   EXCISIONAL TOTAL KNEE ARTHROPLASTY Right 12/03/2012   Procedure: EXCISIONAL TOTAL KNEE ARTHROPLASTY (POLY SWAP), SCAR TISSUE REMOVAL, QUADRICEPSPLASTY;  Surgeon: Dannielle Huh, MD;  Location: [facility] OR;  Service: Orthopedics;  Laterality: Right;   INCISION AND DRAINAGE OF WOUND Right 2006   "w/jet lavage; had to do this twice after the OR" (12/03/2012)   KNEE SURGERY Right 1967, 1973, 1980   "benign tumor removal" (12/03/2012)   SHOULDER  ARTHROSCOPY WITH OPEN ROTATOR CUFF REPAIR Right 2006   SHOULDER ARTHROSCOPY WITH OPEN ROTATOR CUFF REPAIR Left 06/2012   SPERMATOCELECTOMY     TOTAL KNEE ARTHROPLASTY  11/16/2011   Procedure: TOTAL KNEE ARTHROPLASTY;  Surgeon: Nestor Lewandowsky, MD;  Location: [facility] OR;  Service: Orthopedics;  Laterality: Right;  right total knee arthroplasty   TOTAL KNEE REVISION WITH SCAR DEBRIDEMENT/PATELLA REVISION WITH POLY EXCHANGE Right 12/03/2012   "not sure what they did exactly" (10/'13/2014)    Current Outpatient Medications  Medication Sig  Dispense Refill Last Dose   aspirin EC 81 MG tablet Take 81 mg by mouth daily.      buprenorphine (SUBUTEX) 8 MG SUBL SL tablet Place 8 mg under the tongue 4 (four) times daily.      docusate sodium (COLACE) 100 MG capsule Take 200 mg by mouth daily as needed for mild constipation.      ezetimibe (ZETIA) 10 MG tablet TAKE 1 TABLET BY MOUTH EVERY DAY 90 tablet 0    ibuprofen (ADVIL,MOTRIN) 200 MG tablet Take 600 mg by mouth 2 (two) times daily as needed for pain.      Multiple Vitamins-Minerals (MENS MULTI VITAMIN & MINERAL PO) Take 1 tablet by mouth daily.       neomycin-bacitracin-polymyxin (NEOSPORIN) OINT Apply 1 Application topically as needed for wound care.      Omega-3 Fatty Acids (FISH OIL) 500 MG CAPS Take 500 mg by mouth 2 (two) times daily.      PARoxetine (PAXIL) 20 MG tablet TAKE 1 TABLET BY MOUTH EVERY DAY 90 tablet 3    rosuvastatin (CRESTOR) 20 MG tablet TAKE 1 TABLET BY MOUTH EVERY DAY 90 tablet 1    telmisartan (MICARDIS) 20 MG tablet Take 1 tablet (20 mg total) by mouth daily. 90 tablet 3    No current facility-administered medications for this visit.   No Known Allergies  Social History   Tobacco Use   Smoking status: Never   Smokeless tobacco: Current    Types: Snuff, Chew  Substance Use Topics   Alcohol use: Not Currently    Comment: 12/03/2012 "quit all alcohol in 1998; family hx of problems w/it"    Family History  Problem Relation Age of Onset   Cancer Mother 68       breast   Hyperlipidemia Mother    Breast cancer Mother    Heart disease Father    Cancer Father        prostate   Prostate cancer Father    Hyperlipidemia Brother    Cancer Maternal Aunt        breast   Breast cancer Maternal Aunt    Alcohol abuse Paternal Aunt    Heart disease Maternal Grandfather    Alcohol abuse Paternal Grandfather    Heart disease Paternal Grandfather    Breast cancer Cousin    Colon cancer Neg Hx    Colon polyps Neg Hx    Esophageal cancer Neg Hx    Rectal  cancer Neg Hx    Stomach cancer Neg Hx      Review of Systems  Musculoskeletal:  Positive for arthralgias and gait problem.    Objective:  Physical Exam Constitutional:      Appearance: Normal appearance.  HENT:     Head: Normocephalic and atraumatic.     Nose: Nose normal.     Mouth/Throat:     Mouth: Mucous membranes are moist.     Pharynx: Oropharynx is clear.  Eyes:  Conjunctiva/sclera: Conjunctivae normal.  Cardiovascular:     Rate and Rhythm: Normal rate and regular rhythm.     Pulses: Normal pulses.     Heart sounds: Normal heart sounds.  Pulmonary:     Effort: Pulmonary effort is normal.     Breath sounds: Normal breath sounds.  Abdominal:     General: Abdomen is flat.     Palpations: Abdomen is soft.  Genitourinary:    Comments: Deferred. Musculoskeletal:     Cervical back: Normal range of motion and neck supple.     Comments: Examination of the left hip reveals no skin wounds or lesions. He has severe pain and stiffness with flexion and rotation of the hip. I can flex him to 90. He is in external rotation at baseline, no internal rotation. Trochanteric tenderness to palpation.   Sensory changes noted at baseline, otherwise motor function intact in LE bilaterally. Distal pedal pulses 2+ bilaterally.   No significant pedal edema. Calves soft and non-tender.   Skin:    General: Skin is warm and dry.     Capillary Refill: Capillary refill takes less than 2 seconds.  Neurological:     General: No focal deficit present.     Mental Status: He is alert and oriented to person, place, and time.  Psychiatric:        Mood and Affect: Mood normal.        Behavior: Behavior normal.        Thought Content: Thought content normal.        Judgment: Judgment normal.     Vital signs in last 24 hours: @VSRANGES @  Labs:   Estimated body mass index is 26.12 kg/m as calculated from the following:   Height as of an earlier encounter on 10/04/22: 6\' 6"  (1.981 m).    Weight as of an earlier encounter on 10/04/22: 102.5 kg.   Imaging Review Plain radiographs demonstrate severe degenerative joint disease of the left hip(s). The bone quality appears to be adequate for age and reported activity level.      Assessment/Plan:  End stage arthritis, left hip(s)  The patient history, physical examination, clinical judgement of the provider and imaging studies are consistent with end stage degenerative joint disease of the left hip(s) and total hip arthroplasty is deemed medically necessary. The treatment options including medical management, injection therapy, arthroscopy and arthroplasty were discussed at length. The risks and benefits of total hip arthroplasty were presented and reviewed. The risks due to aseptic loosening, infection, stiffness, dislocation/subluxation,  thromboembolic complications and other imponderables were discussed.  The patient acknowledged the explanation, agreed to proceed with the plan and consent was signed. Patient is being admitted for inpatient treatment for surgery, pain control, PT, OT, prophylactic antibiotics, VTE prophylaxis, progressive ambulation and ADL's and discharge planning.The patient is planning to be discharged home with HEP after an overnight stay.   Therapy Plans: HEP.  Disposition: Home with daughter and brother Planned DVT Prophylaxis: aspirin 81mg  BID DME needed: walker.  PCP: Cleared. Cardiology: Cleared.  TXA: IV Allergies: NDKA.  Anesthesia Concerns: None.  BMI: 25.9 Last HgbA1c: Not diabetic. Other: - A-fib, aspirin only.  - Neuropathy issues, pain management with buprenorphine. 8mg  subq 4x/day.  - History Rt TKA, outside facility.  - Ulcer on right great toe. Patient will let us know if not healed prior to, currently healing and being treated with abx.  - Pain medication?? dilaudid?, zofran, methocarbamol, has meloxicam.  - Labs pending.     Patient's  anticipated LOS is less than 2 midnights,  meeting these requirements: - Younger than 97 - Lives within 1 hour of care - Has a competent adult at home to recover with post-op recover - NO history of  - Chronic pain requiring opiods  - Diabetes  - Coronary Artery Disease  - Heart failure  - Heart attack  - Stroke  - DVT/VTE  - Cardiac arrhythmia  - Respiratory Failure/COPD  - Renal failure  - Anemia  - Advanced Liver disease

## 2022-10-05 ENCOUNTER — Encounter: Payer: Self-pay | Admitting: Family Medicine

## 2022-10-05 ENCOUNTER — Ambulatory Visit: Payer: Medicare PPO | Admitting: Podiatry

## 2022-10-05 ENCOUNTER — Ambulatory Visit: Payer: Medicare PPO | Admitting: Family Medicine

## 2022-10-05 VITALS — BP 148/80 | HR 60 | Temp 97.8°F | Ht 78.0 in | Wt 230.8 lb

## 2022-10-05 DIAGNOSIS — M2031 Hallux varus (acquired), right foot: Secondary | ICD-10-CM | POA: Diagnosis not present

## 2022-10-05 DIAGNOSIS — D649 Anemia, unspecified: Secondary | ICD-10-CM

## 2022-10-05 DIAGNOSIS — I10 Essential (primary) hypertension: Secondary | ICD-10-CM

## 2022-10-05 DIAGNOSIS — L97511 Non-pressure chronic ulcer of other part of right foot limited to breakdown of skin: Secondary | ICD-10-CM

## 2022-10-05 MED ORDER — DOXYCYCLINE HYCLATE 100 MG PO CAPS: 100.0000 mg | ORAL_CAPSULE | Freq: Two times a day (BID) | ORAL | 0 refills | Status: AC

## 2022-10-05 MED ORDER — AMLODIPINE BESYLATE 5 MG PO TABS: 5.0000 mg | ORAL_TABLET | Freq: Every day | ORAL | 2 refills | Status: DC

## 2022-10-05 NOTE — Progress Notes (Unsigned)
Established Patient Office Visit  Subjective   Patient ID: Jimmy Black, male    DOB: May 07, 1960  Age: 62 y.o. MRN: [id number]  No chief complaint on file.   HPI  {History (Optional):23778} [name] is seen to evaluate elevated blood pressure.  He has known history of hypertension and has been on Micardis 20 mg daily for quite some time.  He is compliant with therapy.  He had multiple readings up to 170s to 180s systolic and around 100 or so diastolic.  He had some intermittent mild dizziness and occasional ringing in the ears.  Occasional headaches.  Does take a lot of ibuprofen.  No alcohol.  Tries to watch sodium intake closely.  Other issue is anemia.  He is getting ready to have hip replacement surgery had preop labs with hemoglobin 11.2 with MCV of 81.  We had previously ordered iron studies but these were never done.  He has never had colonoscopy.  Denies any abdominal pain.  No melena.  No hematemesis.  Past Medical History:  Diagnosis Date   Alcohol abuse    Allergic rhinitis 06/21/2015   Anxiety    takes Paxil daily   Anxiety state 03/07/2009   Qualifier: Diagnosis of  By: Moreno-Coll  MD, Adlih     Arthritis of right knee 11/16/2011   Atrial fibrillation (HCC) 11/16/2018   Chicken pox    Chronic neuropathic pain 06/11/2019   Dysrhythmia    Family history of anesthesia complication    brother got sick after anesthesia   History of alcohol abuse 08/17/2011   Hyperlipidemia    takes Fish Oil daily   Hypertension 03/20/2018   Joint pain    Joint swelling    KNEE PAIN, RIGHT 03/07/2009   Qualifier: Diagnosis of  By: Moreno-Coll  MD, Adlih     Numbness    both leg and feet   Obstructive sleep apnea 06/21/2015   Osteoarthritis    "right knee; both hips" (12/03/2012)   Palpitations 11/16/2018   Peripheral neuropathy    Sleep apnea    not started cpap as of 5-6 -2021 pv    Spermatocele 09/09/2021   Formatting of this note might be different from the  original. Bilateral   Splenic laceration 12/21/2016   Past Surgical History:  Procedure Laterality Date   EXCISIONAL TOTAL KNEE ARTHROPLASTY Right 12/03/2012   Procedure: EXCISIONAL TOTAL KNEE ARTHROPLASTY (POLY SWAP), SCAR TISSUE REMOVAL, QUADRICEPSPLASTY;  Surgeon: Dannielle Huh, MD;  Location: [facility] OR;  Service: Orthopedics;  Laterality: Right;   INCISION AND DRAINAGE OF WOUND Right 2006   "w/jet lavage; had to do this twice after the OR" (12/03/2012)   KNEE SURGERY Right 1967, 1973, 1980   "benign tumor removal" (12/03/2012)   SHOULDER ARTHROSCOPY WITH OPEN ROTATOR CUFF REPAIR Right 2006   SHOULDER ARTHROSCOPY WITH OPEN ROTATOR CUFF REPAIR Left 06/2012   SPERMATOCELECTOMY     TOTAL KNEE ARTHROPLASTY  11/16/2011   Procedure: TOTAL KNEE ARTHROPLASTY;  Surgeon: Nestor Lewandowsky, MD;  Location: [facility] OR;  Service: Orthopedics;  Laterality: Right;  right total knee arthroplasty   TOTAL KNEE REVISION WITH SCAR DEBRIDEMENT/PATELLA REVISION WITH POLY EXCHANGE Right 12/03/2012   "not sure what they did exactly" (10/'13/2014)    reports that he has never smoked. His smokeless tobacco use includes snuff and chew. He reports that he does not currently use alcohol. He reports that he does not use drugs. family history includes Alcohol abuse in his paternal aunt and paternal grandfather; Breast cancer  in his cousin, maternal aunt, and mother; Cancer in his father and maternal aunt; Cancer (age of onset: 74) in his mother; Heart disease in his father, maternal grandfather, and paternal grandfather; Hyperlipidemia in his brother and mother; Prostate cancer in his father. No Known Allergies  Review of Systems  Constitutional:  Negative for chills, fever and malaise/fatigue.  Eyes:  Negative for blurred vision.  Respiratory:  Negative for shortness of breath.   Cardiovascular:  Negative for chest pain.  Neurological:  Negative for dizziness, weakness and headaches.      Objective:     BP (!) 148/80 (BP  Location: Left Arm, Cuff Size: Normal)   Pulse 60   Temp 97.8 F (36.6 C) (Oral)   Ht 6\' 6"  (1.981 m)   Wt 230 lb 12.8 oz (104.7 kg)   SpO2 99%   BMI 26.67 kg/m  BP Readings from Last 3 Encounters:  10/05/22 (!) 148/80  10/04/22 (!) 160/100  09/06/22 (!) 162/80   Wt Readings from Last 3 Encounters:  10/05/22 230 lb 12.8 oz (104.7 kg)  10/04/22 226 lb (102.5 kg)  09/06/22 251 lb 14.4 oz (114.3 kg)      Physical Exam Vitals reviewed.  Constitutional:      Appearance: Normal appearance. He is well-developed.  Eyes:     Pupils: Pupils are equal, round, and reactive to light.  Neck:     Thyroid: No thyromegaly.  Cardiovascular:     Rate and Rhythm: Normal rate and regular rhythm.  Pulmonary:     Effort: Pulmonary effort is normal. No respiratory distress.     Breath sounds: Normal breath sounds. No wheezing or rales.  Musculoskeletal:     Cervical back: Neck supple.     Right lower leg: No edema.     Left lower leg: No edema.  Neurological:     Mental Status: He is alert and oriented to person, place, and time.      No results found for any visits on 10/05/22.  Last CBC Lab Results  Component Value Date   WBC 8.3 10/04/2022   HGB 11.2 (L) 10/04/2022   HCT 35.5 (L) 10/04/2022   MCV 81.2 10/04/2022   MCH 25.6 (L) 10/04/2022   RDW 16.0 (H) 10/04/2022   PLT 280 10/04/2022   Last metabolic panel Lab Results  Component Value Date   GLUCOSE 93 10/04/2022   NA 135 10/04/2022   K 3.9 10/04/2022   CL 104 10/04/2022   CO2 23 10/04/2022   BUN 21 (H) 10/04/2022   CREATININE 0.73 10/04/2022   GFRNONAA >60 10/04/2022   CALCIUM 9.1 10/04/2022   PROT 7.1 03/18/2022   ALBUMIN 4.6 03/18/2022   BILITOT 0.6 03/18/2022   ALKPHOS 58 03/18/2022   AST 25 03/18/2022   ALT 23 03/18/2022   ANIONGAP 8 10/04/2022   Last lipids Lab Results  Component Value Date   CHOL 143 03/18/2022   HDL 49.90 03/18/2022   LDLCALC 75 03/18/2022   LDLDIRECT 110.0 03/20/2018   TRIG 91.0  03/18/2022   CHOLHDL 3 03/18/2022   Last hemoglobin A1c Lab Results  Component Value Date   HGBA1C 5.5 09/18/2015   Last thyroid functions Lab Results  Component Value Date   TSH 1.45 09/28/2020      The 10-year ASCVD risk score (Arnett DK, et al., 2019) is: 8.7%    Assessment & Plan:   #1 hypertension poorly controlled.  Add amlodipine 5 mg daily to his telmisartan.  Continue low-sodium diet.  Continue close monitoring of blood pressure.  Will need close follow-up after his hip surgery.  We really need to get his blood pressure down prior to surgery if possible.  #2 history of anemia.  Recommend further studies today including ferritin, TIBC, iron, B12.  If iron deficiency confirmed needs GI evaluation.  Patient has never had previous colonoscopy. -Caution to lay off nonsteroidals as much as possible   No follow-ups on file.    Evelena Peat, MD

## 2022-10-05 NOTE — Progress Notes (Signed)
Chief Complaint  Patient presents with   Wound Check    RIGHT HALLUX ULCER NON DIABETIC, DENIES N/V/F/C/SOB, 1 MO AGO NOTICED IT, SAW PCP 2WKS AGO AND WAS GIVEN ABX, STILL HAVING PAIN THAT IS RADIATING UP INTO TOP OF FOOT AND ANKLE, HAVING DRAINAGE DAILY. HX OF NEUROPATHY     HPI: 62 y.o. male presenting today with concern of a wound on the right great toe.  He states that he is scheduled to have a left total hip replacement next week.  He was recently on antibiotic but has finished.  Denies fever, chills, nausea, vomiting.  Past Medical History:  Diagnosis Date   Alcohol abuse    Allergic rhinitis 06/21/2015   Anxiety    takes Paxil daily   Anxiety state 03/07/2009   Qualifier: Diagnosis of  By: Moreno-Coll  MD, Adlih     Arthritis of right knee 11/16/2011   Atrial fibrillation (HCC) 11/16/2018   Chicken pox    Chronic neuropathic pain 06/11/2019   Dysrhythmia    Family history of anesthesia complication    brother got sick after anesthesia   History of alcohol abuse 08/17/2011   Hyperlipidemia    takes Fish Oil daily   Hypertension 03/20/2018   Joint pain    Joint swelling    KNEE PAIN, RIGHT 03/07/2009   Qualifier: Diagnosis of  By: Moreno-Coll  MD, Adlih     Numbness    both leg and feet   Obstructive sleep apnea 06/21/2015   Osteoarthritis    "right knee; both hips" (12/03/2012)   Palpitations 11/16/2018   Peripheral neuropathy    Sleep apnea    not started cpap as of 5-6 -2021 pv    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 Bilateral   Splenic laceration 12/21/2016    Past Surgical History:  Procedure Laterality Date   EXCISIONAL TOTAL KNEE ARTHROPLASTY Right 12/03/2012   Procedure: EXCISIONAL TOTAL KNEE ARTHROPLASTY (POLY SWAP), SCAR TISSUE REMOVAL, QUADRICEPSPLASTY;  Surgeon: Dannielle Huh, MD;  Location: [facility] OR;  Service: Orthopedics;  Laterality: Right;   INCISION AND DRAINAGE OF WOUND Right 2006   "w/jet  lavage; had to do this twice after the OR" (12/03/2012)   KNEE SURGERY Right 1967, 1973, 1980   "benign tumor removal" (12/03/2012)   SHOULDER ARTHROSCOPY WITH OPEN ROTATOR CUFF REPAIR Right 2006   SHOULDER ARTHROSCOPY WITH OPEN ROTATOR CUFF REPAIR Left 06/2012   SPERMATOCELECTOMY     TOTAL KNEE ARTHROPLASTY  11/16/2011   Procedure: TOTAL KNEE ARTHROPLASTY;  Surgeon: Nestor Lewandowsky, MD;  Location: [facility] OR;  Service: Orthopedics;  Laterality: Right;  right total knee arthroplasty   TOTAL KNEE REVISION WITH SCAR DEBRIDEMENT/PATELLA REVISION WITH POLY EXCHANGE Right 12/03/2012   "not sure what they did exactly" (10/'13/2014)    No Known Allergies  Smoker?: non-smoker   PHYSICAL EXAM: There were no vitals filed for this visit.  General: The patient is alert and oriented x3 in no acute distress.  Dermatology: Skin is warm, dry and supple bilateral lower extremities. Interspaces are clear of maceration and debris.      Wound 1 description:  Location: Distal-plantar right hallux    Depth: To dermis    Wound Border: Minimal hyperkeratosis without erythema or edema    Odor?:  None    Surrounding Tissue: Granular base    Infected?:  No    Necrosis?:  No    Pain?:  Yes    Tunneling:  No    Dimensions (cm): 0.8 cm x 0.7 cm x 0.1 cm  Vascular: Pedal pulses are intact right foot  Neurological: Light touch sensation grossly intact bilateral feet.   Musculoskeletal Exam: Semiflexible contracture of the hallux IPJ on the right  ASSESSMENT / PLAN OF CARE: 1. Skin ulcer of right great toe, limited to breakdown of skin (HCC)   2. Hallux malleus of right foot      Meds ordered this encounter  Medications   doxycycline (VIBRAMYCIN) 100 MG capsule    Sig: Take 1 capsule (100 mg total) by mouth 2 (two) times daily for 15 days.    Dispense:  30 capsule    Refill:  0   SURGICAL BOOT  Culture swabs of the wound(s) were obtained today.  Will start the patient back on doxycycline 100 mg twice  daily.  It will be up to the surgeon as to whether to postpone his surgery.  Currently the patient does not have any active infection with the wound.  Attention will need to be given to this area postoperatively as accommodations will need to be made to make sure that area is offloaded during his physical therapy sessions  The ulceration was sharply debrided of hyperkeratotic and devitalized soft tissue with sterile #312 blade to the level of dermis.  Hemostasis obtained.  Antibiotic ointment and DSD applied.  Reviewed off-loading with patient.  Surgical shoe dispensed for patient to wear at all times WB.   Reviewed daily dressing changes with patient.  Discussed risks / concerns regarding ulcer with patient and possible sequelae if left untreated.  Stressed importance of infection prevention at home. Short-term goals are: Prevent infection, off-load ulcer, heal ulcer Long-term goals are:  prevent recurrence, prevent amputation.   Return in about 2 weeks (around 10/19/2022).   Clerance Lav, DPM, FACFAS [facility]     [address] [place], [place] [address]                Office [phone]  Fax [phone]

## 2022-10-06 ENCOUNTER — Encounter: Payer: Self-pay | Admitting: Family Medicine

## 2022-10-06 LAB — IRON,TIBC AND FERRITIN PANEL
%SAT: 7 % — ABNORMAL LOW (ref 20–48)
Ferritin: 10 ng/mL — ABNORMAL LOW (ref 38–380)
Iron: 27 ug/dL — ABNORMAL LOW (ref 50–180)
TIBC: 414 ug/dL (ref 250–425)

## 2022-10-06 LAB — VITAMIN B12: Vitamin B-12: 521 pg/mL (ref 211–911)

## 2022-10-07 ENCOUNTER — Encounter: Payer: Self-pay | Admitting: Gastroenterology

## 2022-10-07 ENCOUNTER — Encounter: Payer: Self-pay | Admitting: Family Medicine

## 2022-10-07 NOTE — Telephone Encounter (Signed)
Urgent referral placed

## 2022-10-07 NOTE — Addendum Note (Signed)
Addended by: Christy Sartorius on: 10/07/2022 07:25 AM   Modules accepted: Orders

## 2022-10-10 ENCOUNTER — Telehealth: Payer: Self-pay | Admitting: Family Medicine

## 2022-10-10 NOTE — Telephone Encounter (Signed)
[facility] received referral [id number]. It should have gone to [facility]

## 2022-10-13 ENCOUNTER — Other Ambulatory Visit: Payer: Self-pay

## 2022-10-13 ENCOUNTER — Observation Stay (HOSPITAL_COMMUNITY): Payer: Medicare PPO

## 2022-10-13 ENCOUNTER — Ambulatory Visit (HOSPITAL_COMMUNITY): Payer: Medicare PPO | Admitting: Certified Registered Nurse Anesthetist

## 2022-10-13 ENCOUNTER — Observation Stay (HOSPITAL_COMMUNITY)
Admission: RE | Admit: 2022-10-13 | Discharge: 2022-10-14 | Disposition: A | Discharge status: Home / Self Care | Payer: Medicare PPO | Source: Ambulatory Visit | Attending: Orthopedic Surgery | Admitting: Orthopedic Surgery

## 2022-10-13 ENCOUNTER — Ambulatory Visit (HOSPITAL_COMMUNITY): Payer: Medicare PPO

## 2022-10-13 ENCOUNTER — Encounter (HOSPITAL_COMMUNITY): Payer: Self-pay | Admitting: Orthopedic Surgery

## 2022-10-13 ENCOUNTER — Encounter (HOSPITAL_COMMUNITY)
Admission: RE | Disposition: A | Discharge status: Home / Self Care | Payer: Self-pay | Source: Ambulatory Visit | Attending: Orthopedic Surgery

## 2022-10-13 ENCOUNTER — Ambulatory Visit (HOSPITAL_BASED_OUTPATIENT_CLINIC_OR_DEPARTMENT_OTHER): Payer: Medicare PPO | Admitting: Certified Registered Nurse Anesthetist

## 2022-10-13 DIAGNOSIS — Z96651 Presence of right artificial knee joint: Secondary | ICD-10-CM | POA: Insufficient documentation

## 2022-10-13 DIAGNOSIS — Z96642 Presence of left artificial hip joint: Secondary | ICD-10-CM | POA: Diagnosis not present

## 2022-10-13 DIAGNOSIS — I10 Essential (primary) hypertension: Secondary | ICD-10-CM | POA: Insufficient documentation

## 2022-10-13 DIAGNOSIS — I4891 Unspecified atrial fibrillation: Secondary | ICD-10-CM | POA: Insufficient documentation

## 2022-10-13 DIAGNOSIS — Z79899 Other long term (current) drug therapy: Secondary | ICD-10-CM | POA: Insufficient documentation

## 2022-10-13 DIAGNOSIS — M1612 Unilateral primary osteoarthritis, left hip: Secondary | ICD-10-CM

## 2022-10-13 DIAGNOSIS — Z7982 Long term (current) use of aspirin: Secondary | ICD-10-CM | POA: Insufficient documentation

## 2022-10-13 DIAGNOSIS — Z471 Aftercare following joint replacement surgery: Secondary | ICD-10-CM | POA: Diagnosis not present

## 2022-10-13 DIAGNOSIS — F17220 Nicotine dependence, chewing tobacco, uncomplicated: Secondary | ICD-10-CM | POA: Diagnosis not present

## 2022-10-13 HISTORY — PX: TOTAL HIP ARTHROPLASTY: SHX124

## 2022-10-13 SURGERY — ARTHROPLASTY, HIP, TOTAL, ANTERIOR APPROACH
Anesthesia: Spinal | Site: Hip | Laterality: Left

## 2022-10-13 MED ORDER — POVIDONE-IODINE 10 % EX SWAB: 2.0000 | Freq: Once | CUTANEOUS | Status: DC

## 2022-10-13 MED ORDER — NYSTATIN 100000 UNIT/GM EX POWD
Freq: Two times a day (BID) | CUTANEOUS | Status: DC
  Administered 2022-10-13: 1 via TOPICAL
  Filled 2022-10-13: qty 15

## 2022-10-13 MED ORDER — OXYCODONE HCL 5 MG PO TABS
5.0000 mg | ORAL_TABLET | Freq: Once | ORAL | Status: AC | PRN
  Administered 2022-10-13: 5 mg via ORAL

## 2022-10-13 MED ORDER — POLYETHYLENE GLYCOL 3350 17 G PO PACK: 17.0000 g | PACK | Freq: Every day | ORAL | Status: DC | PRN

## 2022-10-13 MED ORDER — ROCURONIUM BROMIDE 10 MG/ML (PF) SYRINGE
PREFILLED_SYRINGE | INTRAVENOUS | Status: AC
  Filled 2022-10-13: qty 10

## 2022-10-13 MED ORDER — PROPOFOL 1000 MG/100ML IV EMUL
INTRAVENOUS | Status: AC
  Filled 2022-10-13: qty 100

## 2022-10-13 MED ORDER — PANTOPRAZOLE SODIUM 40 MG PO TBEC
40.0000 mg | DELAYED_RELEASE_TABLET | Freq: Every day | ORAL | Status: DC
  Administered 2022-10-13 – 2022-10-14 (×2): 40 mg via ORAL
  Filled 2022-10-13 (×2): qty 1

## 2022-10-13 MED ORDER — FENTANYL CITRATE PF 50 MCG/ML IJ SOSY
25.0000 ug | PREFILLED_SYRINGE | INTRAMUSCULAR | Status: DC | PRN
  Administered 2022-10-13 (×2): 50 ug via INTRAVENOUS

## 2022-10-13 MED ORDER — BUPIVACAINE-EPINEPHRINE 0.25% -1:200000 IJ SOLN
INTRAMUSCULAR | Status: DC | PRN
  Administered 2022-10-13: 30 mL

## 2022-10-13 MED ORDER — ONDANSETRON HCL 4 MG/2ML IJ SOLN
INTRAMUSCULAR | Status: AC
  Filled 2022-10-13: qty 2

## 2022-10-13 MED ORDER — DEXAMETHASONE SODIUM PHOSPHATE 10 MG/ML IJ SOLN
INTRAMUSCULAR | Status: AC
  Filled 2022-10-13: qty 1

## 2022-10-13 MED ORDER — LIDOCAINE HCL (PF) 2 % IJ SOLN
INTRAMUSCULAR | Status: AC
  Filled 2022-10-13: qty 5

## 2022-10-13 MED ORDER — ORAL CARE MOUTH RINSE: 15.0000 mL | Freq: Once | OROMUCOSAL | Status: AC

## 2022-10-13 MED ORDER — ALUM & MAG HYDROXIDE-SIMETH 200-200-20 MG/5ML PO SUSP: 30.0000 mL | ORAL | Status: DC | PRN

## 2022-10-13 MED ORDER — ONDANSETRON HCL 4 MG/2ML IJ SOLN
INTRAMUSCULAR | Status: DC | PRN
  Administered 2022-10-13: 4 mg via INTRAVENOUS

## 2022-10-13 MED ORDER — ISOPROPYL ALCOHOL 70 % SOLN
Status: DC | PRN
  Administered 2022-10-13: 1 via TOPICAL

## 2022-10-13 MED ORDER — FENTANYL CITRATE (PF) 100 MCG/2ML IJ SOLN
INTRAMUSCULAR | Status: AC
  Filled 2022-10-13: qty 2

## 2022-10-13 MED ORDER — BUPRENORPHINE HCL 8 MG SL SUBL
8.0000 mg | SUBLINGUAL_TABLET | Freq: Four times a day (QID) | SUBLINGUAL | Status: DC
  Filled 2022-10-13: qty 1

## 2022-10-13 MED ORDER — OXYCODONE HCL 5 MG PO TABS
10.0000 mg | ORAL_TABLET | ORAL | Status: DC | PRN
  Administered 2022-10-13 – 2022-10-14 (×5): 15 mg via ORAL
  Filled 2022-10-13 (×5): qty 3

## 2022-10-13 MED ORDER — ONDANSETRON HCL 4 MG/2ML IJ SOLN: 4.0000 mg | Freq: Four times a day (QID) | INTRAMUSCULAR | Status: DC | PRN

## 2022-10-13 MED ORDER — TRANEXAMIC ACID-NACL 1000-0.7 MG/100ML-% IV SOLN
1000.0000 mg | INTRAVENOUS | Status: AC
  Administered 2022-10-13: 1000 mg via INTRAVENOUS
  Filled 2022-10-13: qty 100

## 2022-10-13 MED ORDER — ONDANSETRON HCL 4 MG PO TABS: 4.0000 mg | ORAL_TABLET | Freq: Four times a day (QID) | ORAL | Status: DC | PRN

## 2022-10-13 MED ORDER — EPHEDRINE SULFATE-NACL 50-0.9 MG/10ML-% IV SOSY
PREFILLED_SYRINGE | INTRAVENOUS | Status: DC | PRN
Start: 2022-10-13 — End: 2022-10-13
  Administered 2022-10-13: 10 mg via INTRAVENOUS

## 2022-10-13 MED ORDER — DEXAMETHASONE SODIUM PHOSPHATE 10 MG/ML IJ SOLN
INTRAMUSCULAR | Status: DC | PRN
  Administered 2022-10-13: 10 mg via INTRAVENOUS

## 2022-10-13 MED ORDER — METOCLOPRAMIDE HCL 5 MG PO TABS: 5.0000 mg | ORAL_TABLET | Freq: Three times a day (TID) | ORAL | Status: DC | PRN

## 2022-10-13 MED ORDER — PROPOFOL 10 MG/ML IV BOLUS
INTRAVENOUS | Status: AC
  Filled 2022-10-13: qty 20

## 2022-10-13 MED ORDER — PHENYLEPHRINE HCL (PRESSORS) 10 MG/ML IV SOLN
INTRAVENOUS | Status: AC
  Filled 2022-10-13: qty 1

## 2022-10-13 MED ORDER — CELECOXIB 200 MG PO CAPS
200.0000 mg | ORAL_CAPSULE | Freq: Two times a day (BID) | ORAL | Status: DC
  Administered 2022-10-13 – 2022-10-14 (×3): 200 mg via ORAL
  Filled 2022-10-13 (×3): qty 1

## 2022-10-13 MED ORDER — METHOCARBAMOL 500 MG PO TABS
500.0000 mg | ORAL_TABLET | Freq: Four times a day (QID) | ORAL | Status: DC | PRN
  Administered 2022-10-13: 500 mg via ORAL
  Filled 2022-10-13: qty 1

## 2022-10-13 MED ORDER — LACTATED RINGERS IV SOLN: INTRAVENOUS | Status: DC

## 2022-10-13 MED ORDER — CHLORHEXIDINE GLUCONATE 0.12 % MT SOLN
15.0000 mL | Freq: Once | OROMUCOSAL | Status: AC
  Administered 2022-10-13: 15 mL via OROMUCOSAL

## 2022-10-13 MED ORDER — OXYCODONE HCL 5 MG PO TABS: 5.0000 mg | ORAL_TABLET | ORAL | Status: DC | PRN

## 2022-10-13 MED ORDER — ASPIRIN 81 MG PO CHEW
81.0000 mg | CHEWABLE_TABLET | Freq: Two times a day (BID) | ORAL | Status: DC
  Administered 2022-10-13 – 2022-10-14 (×2): 81 mg via ORAL
  Filled 2022-10-13 (×2): qty 1

## 2022-10-13 MED ORDER — DOCUSATE SODIUM 100 MG PO CAPS
100.0000 mg | ORAL_CAPSULE | Freq: Two times a day (BID) | ORAL | Status: DC
  Administered 2022-10-13 – 2022-10-14 (×2): 100 mg via ORAL
  Filled 2022-10-13 (×2): qty 1

## 2022-10-13 MED ORDER — PROPOFOL 500 MG/50ML IV EMUL
INTRAVENOUS | Status: DC | PRN
  Administered 2022-10-13: 125 ug/kg/min via INTRAVENOUS

## 2022-10-13 MED ORDER — AMISULPRIDE (ANTIEMETIC) 5 MG/2ML IV SOLN: 10.0000 mg | Freq: Once | INTRAVENOUS | Status: DC | PRN

## 2022-10-13 MED ORDER — MENTHOL 3 MG MT LOZG: 1.0000 | LOZENGE | OROMUCOSAL | Status: DC | PRN

## 2022-10-13 MED ORDER — FENTANYL CITRATE PF 50 MCG/ML IJ SOSY
PREFILLED_SYRINGE | INTRAMUSCULAR | Status: AC
  Filled 2022-10-13: qty 1

## 2022-10-13 MED ORDER — FENTANYL CITRATE PF 50 MCG/ML IJ SOSY
50.0000 ug | PREFILLED_SYRINGE | INTRAMUSCULAR | Status: DC | PRN
  Administered 2022-10-13: 50 ug via INTRAVENOUS

## 2022-10-13 MED ORDER — BISACODYL 10 MG RE SUPP: 10.0000 mg | Freq: Every day | RECTAL | Status: DC | PRN

## 2022-10-13 MED ORDER — ACETAMINOPHEN 325 MG PO TABS: 325.0000 mg | ORAL_TABLET | Freq: Four times a day (QID) | ORAL | Status: DC | PRN

## 2022-10-13 MED ORDER — SODIUM CHLORIDE 0.9 % IR SOLN
Status: DC | PRN
  Administered 2022-10-13: 3000 mL
  Administered 2022-10-13: 1000 mL

## 2022-10-13 MED ORDER — FENTANYL CITRATE (PF) 100 MCG/2ML IJ SOLN
INTRAMUSCULAR | Status: DC | PRN
  Administered 2022-10-13: 100 ug via INTRAVENOUS

## 2022-10-13 MED ORDER — METHOCARBAMOL 1000 MG/10ML IJ SOLN: 500.0000 mg | Freq: Four times a day (QID) | INTRAVENOUS | Status: DC | PRN

## 2022-10-13 MED ORDER — SENNA 8.6 MG PO TABS
1.0000 | ORAL_TABLET | Freq: Two times a day (BID) | ORAL | Status: DC
  Administered 2022-10-13 – 2022-10-14 (×2): 8.6 mg via ORAL
  Filled 2022-10-13 (×2): qty 1

## 2022-10-13 MED ORDER — PROPOFOL 10 MG/ML IV BOLUS
INTRAVENOUS | Status: DC | PRN
  Administered 2022-10-13: 40 mg via INTRAVENOUS
  Administered 2022-10-13: 30 mg via INTRAVENOUS

## 2022-10-13 MED ORDER — METOCLOPRAMIDE HCL 5 MG/ML IJ SOLN: 5.0000 mg | Freq: Three times a day (TID) | INTRAMUSCULAR | Status: DC | PRN

## 2022-10-13 MED ORDER — BUPIVACAINE IN DEXTROSE 0.75-8.25 % IT SOLN
INTRATHECAL | Status: DC | PRN
  Administered 2022-10-13: 2 mL via INTRATHECAL

## 2022-10-13 MED ORDER — CEFAZOLIN SODIUM-DEXTROSE 2-4 GM/100ML-% IV SOLN
2.0000 g | INTRAVENOUS | Status: AC
  Administered 2022-10-13: 2 g via INTRAVENOUS
  Filled 2022-10-13: qty 100

## 2022-10-13 MED ORDER — PROMETHAZINE HCL 25 MG/ML IJ SOLN: 6.2500 mg | INTRAMUSCULAR | Status: DC | PRN

## 2022-10-13 MED ORDER — EZETIMIBE 10 MG PO TABS
10.0000 mg | ORAL_TABLET | Freq: Every day | ORAL | Status: DC
  Administered 2022-10-14: 10 mg via ORAL
  Filled 2022-10-13: qty 1

## 2022-10-13 MED ORDER — BUPIVACAINE-EPINEPHRINE 0.25% -1:200000 IJ SOLN
INTRAMUSCULAR | Status: AC
  Filled 2022-10-13: qty 1

## 2022-10-13 MED ORDER — SODIUM CHLORIDE (PF) 0.9 % IJ SOLN
INTRAMUSCULAR | Status: AC
  Filled 2022-10-13: qty 30

## 2022-10-13 MED ORDER — KETOROLAC TROMETHAMINE 30 MG/ML IJ SOLN
INTRAMUSCULAR | Status: AC
  Filled 2022-10-13: qty 1

## 2022-10-13 MED ORDER — AMLODIPINE BESYLATE 5 MG PO TABS
5.0000 mg | ORAL_TABLET | Freq: Every day | ORAL | Status: DC
  Administered 2022-10-13 – 2022-10-14 (×2): 5 mg via ORAL
  Filled 2022-10-13 (×2): qty 1

## 2022-10-13 MED ORDER — DOXYCYCLINE HYCLATE 100 MG PO TABS
100.0000 mg | ORAL_TABLET | Freq: Two times a day (BID) | ORAL | Status: DC
  Administered 2022-10-13 – 2022-10-14 (×2): 100 mg via ORAL
  Filled 2022-10-13 (×2): qty 1

## 2022-10-13 MED ORDER — ACETAMINOPHEN 500 MG PO TABS
1000.0000 mg | ORAL_TABLET | Freq: Once | ORAL | Status: AC
  Administered 2022-10-13: 1000 mg via ORAL
  Filled 2022-10-13: qty 2

## 2022-10-13 MED ORDER — PHENOL 1.4 % MT LIQD: 1.0000 | OROMUCOSAL | Status: DC | PRN

## 2022-10-13 MED ORDER — ACETAMINOPHEN 500 MG PO TABS
1000.0000 mg | ORAL_TABLET | Freq: Four times a day (QID) | ORAL | Status: DC
  Administered 2022-10-13 – 2022-10-14 (×3): 1000 mg via ORAL
  Filled 2022-10-13 (×3): qty 2

## 2022-10-13 MED ORDER — DIPHENHYDRAMINE HCL 12.5 MG/5ML PO ELIX
12.5000 mg | ORAL_SOLUTION | ORAL | Status: DC | PRN
  Administered 2022-10-13: 25 mg via ORAL
  Filled 2022-10-13: qty 10

## 2022-10-13 MED ORDER — SODIUM CHLORIDE 0.9 % IV SOLN: INTRAVENOUS | Status: DC

## 2022-10-13 MED ORDER — OXYCODONE HCL 5 MG PO TABS
ORAL_TABLET | ORAL | Status: AC
  Filled 2022-10-13: qty 1

## 2022-10-13 MED ORDER — OXYCODONE HCL 5 MG/5ML PO SOLN: 5.0000 mg | Freq: Once | ORAL | Status: AC | PRN

## 2022-10-13 MED ORDER — CEFAZOLIN SODIUM-DEXTROSE 2-4 GM/100ML-% IV SOLN
2.0000 g | Freq: Four times a day (QID) | INTRAVENOUS | Status: AC
  Administered 2022-10-13 (×2): 2 g via INTRAVENOUS
  Filled 2022-10-13 (×2): qty 100

## 2022-10-13 MED ORDER — KETOROLAC TROMETHAMINE 30 MG/ML IJ SOLN
INTRAMUSCULAR | Status: DC | PRN
  Administered 2022-10-13: 30 mg

## 2022-10-13 MED ORDER — HYDROMORPHONE HCL 1 MG/ML IJ SOLN
0.5000 mg | INTRAMUSCULAR | Status: DC | PRN
  Administered 2022-10-13 (×3): 1 mg via INTRAVENOUS
  Filled 2022-10-13 (×3): qty 1

## 2022-10-13 MED ORDER — FENTANYL CITRATE PF 50 MCG/ML IJ SOSY
PREFILLED_SYRINGE | INTRAMUSCULAR | Status: AC
  Administered 2022-10-13: 50 ug via INTRAVENOUS
  Filled 2022-10-13: qty 3

## 2022-10-13 MED ORDER — SODIUM CHLORIDE (PF) 0.9 % IJ SOLN
INTRAMUSCULAR | Status: DC | PRN
  Administered 2022-10-13: 30 mL

## 2022-10-13 MED ORDER — WATER FOR IRRIGATION, STERILE IR SOLN
Status: DC | PRN
  Administered 2022-10-13: 2000 mL

## 2022-10-13 MED ORDER — PAROXETINE HCL 10 MG PO TABS
20.0000 mg | ORAL_TABLET | Freq: Every day | ORAL | Status: DC
  Administered 2022-10-14: 20 mg via ORAL
  Filled 2022-10-13: qty 2

## 2022-10-13 MED ORDER — MIDAZOLAM HCL 2 MG/2ML IJ SOLN
INTRAMUSCULAR | Status: DC | PRN
  Administered 2022-10-13: 2 mg via INTRAVENOUS

## 2022-10-13 MED ORDER — MIDAZOLAM HCL 2 MG/2ML IJ SOLN
INTRAMUSCULAR | Status: AC
  Filled 2022-10-13: qty 2

## 2022-10-13 SURGICAL SUPPLY — 56 items
ADH SKN CLS APL DERMABOND .7 (GAUZE/BANDAGES/DRESSINGS) ×2
ADH SKN CLS LQ APL DERMABOND (GAUZE/BANDAGES/DRESSINGS) ×1
APL PRP STRL LF DISP 70% ISPRP (MISCELLANEOUS) ×1
BAG COUNTER SPONGE SURGICOUNT (BAG) IMPLANT
BAG SPEC THK2 15X12 ZIP CLS (MISCELLANEOUS)
BAG SPNG CNTER NS LX DISP (BAG)
BAG ZIPLOCK 12X15 (MISCELLANEOUS) IMPLANT
CHLORAPREP W/TINT 26 (MISCELLANEOUS) ×1 IMPLANT
COVER PERINEAL POST (MISCELLANEOUS) ×1 IMPLANT
COVER SURGICAL LIGHT HANDLE (MISCELLANEOUS) ×1 IMPLANT
DERMABOND ADVANCED .7 DNX12 (GAUZE/BANDAGES/DRESSINGS) ×2 IMPLANT
DERMABOND ADVANCED .7 DNX6 (GAUZE/BANDAGES/DRESSINGS) IMPLANT
DRAPE IMP U-DRAPE 54X76 (DRAPES) ×1 IMPLANT
DRAPE SHEET LG 3/4 BI-LAMINATE (DRAPES) ×3 IMPLANT
DRAPE STERI IOBAN 125X83 (DRAPES) ×1 IMPLANT
DRAPE U-SHAPE 47X51 STRL (DRAPES) ×1 IMPLANT
DRSG AQUACEL AG ADV 3.5X10 (GAUZE/BANDAGES/DRESSINGS) ×1 IMPLANT
ELECT REM PT RETURN 15FT ADLT (MISCELLANEOUS) ×1 IMPLANT
GAUZE SPONGE 4X4 12PLY STRL (GAUZE/BANDAGES/DRESSINGS) ×1 IMPLANT
GLOVE BIO SURGEON STRL SZ7 (GLOVE) ×1 IMPLANT
GLOVE BIO SURGEON STRL SZ8.5 (GLOVE) ×2 IMPLANT
GLOVE BIOGEL PI IND STRL 7.5 (GLOVE) ×1 IMPLANT
GLOVE BIOGEL PI IND STRL 8.5 (GLOVE) ×1 IMPLANT
GOWN SPEC L3 XXLG W/TWL (GOWN DISPOSABLE) ×1 IMPLANT
GOWN STRL REUS W/ TWL XL LVL3 (GOWN DISPOSABLE) ×1 IMPLANT
GOWN STRL REUS W/TWL XL LVL3 (GOWN DISPOSABLE) ×1
HANDPIECE INTERPULSE COAX TIP (DISPOSABLE) ×1
HEAD CERAMIC BIOLOX 36 (Head) IMPLANT
HOLDER FOLEY CATH W/STRAP (MISCELLANEOUS) ×1 IMPLANT
HOOD PEEL AWAY T7 (MISCELLANEOUS) ×3 IMPLANT
KIT TURNOVER KIT A (KITS) IMPLANT
LINER ACETAB ~~LOC~~ H 36 G7 +5 (Liner) IMPLANT
MANIFOLD NEPTUNE II (INSTRUMENTS) ×1 IMPLANT
MARKER SKIN DUAL TIP RULER LAB (MISCELLANEOUS) ×1 IMPLANT
NDL SAFETY ECLIP 18X1.5 (MISCELLANEOUS) ×1 IMPLANT
NDL SPNL 18GX3.5 QUINCKE PK (NEEDLE) ×1 IMPLANT
NEEDLE SPNL 18GX3.5 QUINCKE PK (NEEDLE) ×1 IMPLANT
PACK ANTERIOR HIP CUSTOM (KITS) ×1 IMPLANT
SAW OSC TIP CART 19.5X105X1.3 (SAW) ×1 IMPLANT
SEALER BIPOLAR AQUA 6.0 (INSTRUMENTS) ×1 IMPLANT
SET HNDPC FAN SPRY TIP SCT (DISPOSABLE) ×1 IMPLANT
SHELL ACET G7 4H 62 SZH (Shell) IMPLANT
SOLUTION PRONTOSAN WOUND 350ML (IRRIGATION / IRRIGATOR) ×1 IMPLANT
SPIKE FLUID TRANSFER (MISCELLANEOUS) ×1 IMPLANT
STEM FEM CMTLS PS 20 133D (Stem) IMPLANT
SUT MNCRL AB 3-0 PS2 18 (SUTURE) ×1 IMPLANT
SUT MON AB 2-0 CT1 36 (SUTURE) ×1 IMPLANT
SUT STRATAFIX PDO 1 14 VIOLET (SUTURE) ×1
SUT STRATFX PDO 1 14 VIOLET (SUTURE) ×1
SUT VIC AB 2-0 CT1 27 (SUTURE)
SUT VIC AB 2-0 CT1 TAPERPNT 27 (SUTURE) IMPLANT
SUTURE STRATFX PDO 1 14 VIOLET (SUTURE) ×1 IMPLANT
SYR 3ML LL SCALE MARK (SYRINGE) ×1 IMPLANT
TRAY FOLEY MTR SLVR 16FR STAT (SET/KITS/TRAYS/PACK) IMPLANT
TUBE SUCTION HIGH CAP CLEAR NV (SUCTIONS) ×1 IMPLANT
WATER STERILE IRR 1000ML POUR (IV SOLUTION) ×1 IMPLANT

## 2022-10-13 NOTE — Anesthesia Procedure Notes (Signed)
Spinal  Patient location during procedure: OR Start time: 10/13/2022 8:05 AM End time: 10/13/2022 8:10 AM Reason for block: surgical anesthesia Staffing Performed: anesthesiologist  Anesthesiologist: Leonides Grills, MD Performed by: Leonides Grills, MD Authorized by: Leonides Grills, MD   Preanesthetic Checklist Completed: patient identified, IV checked, risks and benefits discussed, surgical consent, monitors and equipment checked, pre-op evaluation and timeout performed Spinal Block Patient position: sitting Prep: DuraPrep Patient monitoring: cardiac monitor, continuous pulse ox and blood pressure Approach: midline Location: L3-4 Injection technique: single-shot Needle Needle type: Pencan  Needle gauge: 24 G Needle length: 9 cm Assessment Sensory level: T10 Events: CSF return Additional Notes Functioning IV was confirmed and monitors were applied. Sterile prep and drape, including hand hygiene and sterile gloves were used. The patient was positioned and the spine was prepped. The skin was anesthetized with lidocaine.  Free flow of clear CSF was obtained prior to injecting local anesthetic into the CSF.  The spinal needle aspirated freely following injection.  The needle was carefully withdrawn.  The patient tolerated the procedure well.

## 2022-10-13 NOTE — Plan of Care (Signed)
  Problem: Activity: Goal: Ability to avoid complications of mobility impairment will improve Outcome: Progressing   Problem: Clinical Measurements: Goal: Postoperative complications will be avoided or minimized Outcome: Progressing   Problem: Pain Management: Goal: Pain level will decrease with appropriate interventions Outcome: Progressing   Problem: Education: Goal: Knowledge of General Education information will improve Description: Including pain rating scale, medication(s)/side effects and non-pharmacologic comfort measures Outcome: Progressing   Problem: Safety: Goal: Ability to remain free from injury will improve Outcome: Progressing   

## 2022-10-13 NOTE — Discharge Instructions (Signed)

## 2022-10-13 NOTE — Care Plan (Signed)
Ortho Bundle Case Management Note  Patient Details  Name: Jimmy Black MRN: [id number] Date of Birth: 1960/03/06                  L THA on 10/13/22.  DCP: Home with daughter and brother.  DME: RW ordered through [employer]. Has cane.  PT: [facility]   DME Arranged:  Walker rolling DME Agency:  [employer]    Additional Comments: Please contact me with any questions of if this plan should need to change.    Despina Pole, Case Manager  [facility]  [phone] 10/13/2022, 8:31 AM

## 2022-10-13 NOTE — Anesthesia Postprocedure Evaluation (Signed)
Anesthesia Post Note  Patient: Robb Bobek Willoughby-Ray  Procedure(s) Performed: TOTAL HIP ARTHROPLASTY ANTERIOR APPROACH (Left: Hip)     Patient location during evaluation: PACU Anesthesia Type: Spinal Level of consciousness: awake Pain management: pain level controlled Vital Signs Assessment: post-procedure vital signs reviewed and stable Respiratory status: spontaneous breathing, nonlabored ventilation and respiratory function stable Cardiovascular status: blood pressure returned to baseline and stable Postop Assessment: no apparent nausea or vomiting Anesthetic complications: no   No notable events documented.  Last Vitals:  Vitals:   10/13/22 1318 10/13/22 1518  BP: (!) 136/95 (!) 150/88  Pulse: (!) 54 87  Resp: 16 16  Temp: 36.7 C 36.9 C  SpO2:  100%    Last Pain:  Vitals:   10/13/22 1518  TempSrc: Oral  PainSc:                  Bellah Alia P Mariene Dickerman

## 2022-10-13 NOTE — Interval H&P Note (Signed)
History and Physical Interval Note:  10/13/2022 7:30 AM  Jimmy Black  has presented today for surgery, with the diagnosis of Left hip osteoarthritis.  The various methods of treatment have been discussed with the patient and family. After consideration of risks, benefits and other options for treatment, the patient has consented to  Procedure(s) with comments: TOTAL HIP ARTHROPLASTY ANTERIOR APPROACH (Left) - 120 as a surgical intervention.  The patient's history has been reviewed, patient examined, no change in status, stable for surgery.  I have reviewed the patient's chart and labs.  Questions were answered to the patient's satisfaction.     Iline Oven Eric Nees

## 2022-10-13 NOTE — Op Note (Signed)
OPERATIVE REPORT  SURGEON: Samson Frederic, MD   ASSISTANT: Clint Bolder, PA-C.  PREOPERATIVE DIAGNOSIS: Left hip arthritis.   POSTOPERATIVE DIAGNOSIS: Left hip arthritis.   PROCEDURE: Left total hip arthroplasty, anterior approach.   IMPLANTS: Biomet Taperloc Complete Microplasty stem, size 20 x 125 mm, high offset. Biomet G7 OsseoTi Cup, size 62 mm. Biomet Vivacit-E liner, size 36 mm, H, neutral. Biomet Biolox ceramic head ball, size 36 - 3 mm.  ANESTHESIA:  MAC and Spinal  ESTIMATED BLOOD LOSS: 250 mL.    ANTIBIOTICS: 2g Ancef.  DRAINS: None.  COMPLICATIONS: None.   CONDITION: PACU - hemodynamically stable.   BRIEF CLINICAL NOTE: Jimmy Black is a 62 y.o. male with a long-standing history of Left hip arthritis. After failing conservative management, the patient was indicated for total hip arthroplasty. The risks, benefits, and alternatives to the procedure were explained, and the patient elected to proceed.  PROCEDURE IN DETAIL: Surgical site was marked by myself in the pre-op holding area. Once inside the operating room, spinal anesthesia was obtained, and a foley catheter was inserted. The patient was then positioned on the Hana table.  All bony prominences were well padded.  The hip was prepped and draped in the normal sterile surgical fashion.  A time-out was called verifying side and site of surgery. The patient received IV antibiotics within 60 minutes of beginning the procedure.   Bikini incision was made, and superficial dissection was performed lateral to the ASIS. The direct anterior approach to the hip was performed through the Hueter interval.  Lateral femoral circumflex vessels were treated with the Auqumantys. The anterior capsule was exposed and an inverted T capsulotomy was made. The femoral neck cut was made to the level of the templated cut.  A corkscrew was placed into the head and the head was removed.  The femoral head was found to have  eburnated bone. The head was passed to the back table and was measured. Pubofemoral ligament was released off of the calcar, taking care to stay on bone. Superior capsule was released from the greater trochanter, taking care to stay lateral to the posterior border of the femoral neck in order to preserve the short external rotators.   Acetabular exposure was achieved, and the pulvinar and labrum were excised. Sequential reaming of the acetabulum was then performed up to a size 61 mm reamer. A 62 mm cup was then opened and impacted into place at approximately 40 degrees of abduction and 20 degrees of anteversion. The final polyethylene liner was impacted into place and acetabular osteophytes were removed.    I then gained femoral exposure taking care to protect the abductors and greater trochanter.  This was performed using standard external rotation, extension, and adduction.  A cookie cutter was used to enter the femoral canal, and then the femoral canal finder was placed.  Sequential broaching was performed up to a size 20.  Calcar planer was used on the femoral neck remnant.  I placed a high offset neck and a trial head ball.  The hip was reduced.  Leg lengths and offset were checked fluoroscopically.  The hip was dislocated and trial components were removed.  The final implants were placed, and the hip was reduced.  Fluoroscopy was used to confirm component position and leg lengths.  At 90 degrees of external rotation and full extension, the hip was stable to an anterior directed force.   The wound was copiously irrigated with Prontosan solution and normal saline using pulse lavage.  Marcaine solution was injected into the periarticular soft tissue.  The wound was closed in layers using #1 Vicryl and V-Loc for the fascia, 2-0 Vicryl for the subcutaneous fat, 2-0 Monocryl for the deep dermal layer, 3-0 running Monocryl subcuticular stitch, and Dermabond for the skin.  Once the glue was fully dried, an  Aquacell Ag dressing was applied.  The patient was transported to the recovery room in stable condition.  Sponge, needle, and instrument counts were correct at the end of the case x2.  The patient tolerated the procedure well and there were no known complications.  Please note that a surgical assistant was a medical necessity for this procedure to perform it in a safe and expeditious manner. Assistant was necessary to provide appropriate retraction of vital neurovascular structures, to prevent femoral fracture, and to allow for anatomic placement of the prosthesis.

## 2022-10-13 NOTE — Transfer of Care (Signed)
Immediate Anesthesia Transfer of Care Note  Patient: Macheal Skains Willoughby-Ray  Procedure(s) Performed: TOTAL HIP ARTHROPLASTY ANTERIOR APPROACH (Left: Hip)  Patient Location: PACU  Anesthesia Type:Spinal  Level of Consciousness: awake, alert , and oriented  Airway & Oxygen Therapy: Patient Spontanous Breathing and Patient connected to face mask oxygen  Post-op Assessment: Report given to RN and Post -op Vital signs reviewed and stable  Post vital signs: Reviewed and stable  Last Vitals:  Vitals Value Taken Time  BP    Temp    Pulse 55 10/13/22 1017  Resp 13 10/13/22 1017  SpO2 100 % 10/13/22 1017  Vitals shown include unfiled device data.  Last Pain:  Vitals:   10/13/22 0732  TempSrc:   PainSc: 0-No pain         Complications: No notable events documented.

## 2022-10-13 NOTE — Evaluation (Signed)
Physical Therapy Evaluation Patient Details Name: Jimmy Black MRN: [id number] DOB: 01-19-1961 Today's Date: 10/13/2022  History of Present Illness  Pt s/p L THR and with hx of R TKR (and revision) perhipheral neuropathy, and ETOH abuse  Clinical Impression  Pt s/p L THR and presents with decreased L LE strength/ROM, post op pain and limited R knee ROM limiting functional mobility.  Pt should progress to dc home with family assist.      If plan is discharge home, recommend the following: A little help with walking and/or transfers;A little help with bathing/dressing/bathroom;Assistance with cooking/housework;Help with stairs or ramp for entrance;Assist for transportation   Can travel by private vehicle        Equipment Recommendations Rolling walker (2 wheels) (Pt is 6'6")  Recommendations for Other Services       Functional Status Assessment Patient has had a recent decline in their functional status and demonstrates the ability to make significant improvements in function in a reasonable and predictable amount of time.     Precautions / Restrictions Precautions Precautions: Fall Restrictions Weight Bearing Restrictions: No      Mobility  Bed Mobility Overal bed mobility: Needs Assistance Bed Mobility: Supine to Sit     Supine to sit: Min assist     General bed mobility comments: cues for sequence with min assist to manage L LE    Transfers Overall transfer level: Needs assistance Equipment used: Rolling walker (2 wheels) Transfers: Sit to/from Stand Sit to Stand: Min assist           General transfer comment: cues for LE management and use of UEs to self assist    Ambulation/Gait Ambulation/Gait assistance: Min assist, Contact guard assist Gait Distance (Feet): 58 Feet Assistive device: Rolling walker (2 wheels) Gait Pattern/deviations: Step-to pattern, Decreased step length - right, Decreased step length - left, Shuffle, Trunk flexed Gait  velocity: decr     General Gait Details: cues for posture, position from RW and initial sequence  Stairs            Wheelchair Mobility     Tilt Bed    Modified Rankin (Stroke Patients Only)       Balance Overall balance assessment: Needs assistance Sitting-balance support: Feet supported, No upper extremity supported Sitting balance-Leahy Scale: Good     Standing balance support: Bilateral upper extremity supported Standing balance-Leahy Scale: Poor                               Pertinent Vitals/Pain Pain Assessment Pain Assessment: 0-10 Pain Score: 6  Pain Location: L hip Pain Descriptors / Indicators: Aching, Sore Pain Intervention(s): Limited activity within patient's tolerance, Monitored during session, Premedicated before session, Ice applied    Home Living Family/patient expects to be discharged to:: Private residence Living Arrangements: Children Available Help at Discharge: Family;Available 24 hours/day Type of Home: House Home Access: Level entry       Home Layout: One level Home Equipment: Cane - single point      Prior Function Prior Level of Function : Independent/Modified Independent             Mobility Comments: occasional use of cane       Extremity/Trunk Assessment   Upper Extremity Assessment Upper Extremity Assessment: Overall WFL for tasks assessed    Lower Extremity Assessment Lower Extremity Assessment: RLE deficits/detail;LLE deficits/detail RLE Deficits / Details: Knee ltd to ~40 degrees flexion since TKR  revision per pt    Cervical / Trunk Assessment Cervical / Trunk Assessment: Normal  Communication   Communication Communication: No apparent difficulties Cueing Techniques: Verbal cues;Gestural cues;Tactile cues  Cognition Arousal: Alert Behavior During Therapy: WFL for tasks assessed/performed Overall Cognitive Status: Within Functional Limits for tasks assessed                                           General Comments      Exercises Total Joint Exercises Ankle Circles/Pumps: AROM, Both, 15 reps, Supine   Assessment/Plan    PT Assessment Patient needs continued PT services  PT Problem List Decreased strength;Decreased range of motion;Decreased activity tolerance;Decreased balance;Decreased mobility;Decreased safety awareness;Pain       PT Treatment Interventions DME instruction;Gait training;Stair training;Functional mobility training;Therapeutic activities;Balance training;Therapeutic exercise;Patient/family education    PT Goals (Current goals can be found in the Care Plan section)  Acute Rehab PT Goals Patient Stated Goal: Regain IND PT Goal Formulation: With patient Time For Goal Achievement: 10/20/22 Potential to Achieve Goals: Good    Frequency 7X/week     Co-evaluation               AM-PAC PT "6 Clicks" Mobility  Outcome Measure Help needed turning from your back to your side while in a flat bed without using bedrails?: A Little Help needed moving from lying on your back to sitting on the side of a flat bed without using bedrails?: A Little Help needed moving to and from a bed to a chair (including a wheelchair)?: A Little Help needed standing up from a chair using your arms (e.g., wheelchair or bedside chair)?: A Little Help needed to walk in hospital room?: A Little Help needed climbing 3-5 steps with a railing? : A Lot 6 Click Score: 17    End of Session Equipment Utilized During Treatment: Gait belt Activity Tolerance: Patient tolerated treatment well Patient left: in chair;with call bell/phone within reach;with chair alarm set Nurse Communication: Mobility status PT Visit Diagnosis: Difficulty in walking, not elsewhere classified (R26.2)    Time: 9147-8295 PT Time Calculation (min) (ACUTE ONLY): 24 min   Charges:   PT Evaluation $PT Eval Low Complexity: 1 Low   PT General Charges $$ ACUTE PT VISIT: 1 Visit          Mauro Kaufmann PT Acute Rehabilitation Services Pager [phone] Office [phone]   Tristyn Demarest 10/13/2022, 3:42 PM

## 2022-10-13 NOTE — Anesthesia Preprocedure Evaluation (Signed)
Anesthesia Evaluation    Reviewed: Allergy & Precautions, Patient's Chart, lab work & pertinent test results  Airway Mallampati: II  TM Distance: >3 FB Neck ROM: Full    Dental no notable dental hx.    Pulmonary sleep apnea    Pulmonary exam normal        Cardiovascular hypertension, Pt. on medications Normal cardiovascular exam+ dysrhythmias      Neuro/Psych   Anxiety      Neuromuscular disease    GI/Hepatic negative GI ROS, Neg liver ROS,,,  Endo/Other  negative endocrine ROS    Renal/GU negative Renal ROS     Musculoskeletal  (+) Arthritis ,    Abdominal   Peds  Hematology  (+) Blood dyscrasia, anemia   Anesthesia Other Findings Left hip osteoarthritis  Reproductive/Obstetrics                             Anesthesia Physical Anesthesia Plan  ASA: 2  Anesthesia Plan: Spinal   Post-op Pain Management:    Induction: Intravenous  PONV Risk Score and Plan: 1 and Ondansetron, Dexamethasone, Propofol infusion, Midazolam and Treatment may vary due to age or medical condition  Airway Management Planned: Simple Face Mask  Additional Equipment:   Intra-op Plan:   Post-operative Plan:   Informed Consent: I have reviewed the patients History and Physical, chart, labs and discussed the procedure including the risks, benefits and alternatives for the proposed anesthesia with the patient or authorized representative who has indicated his/her understanding and acceptance.     Dental advisory given  Plan Discussed with: CRNA  Anesthesia Plan Comments:         Anesthesia Quick Evaluation

## 2022-10-14 ENCOUNTER — Encounter (HOSPITAL_COMMUNITY): Payer: Self-pay | Admitting: Orthopedic Surgery

## 2022-10-14 DIAGNOSIS — I10 Essential (primary) hypertension: Secondary | ICD-10-CM | POA: Diagnosis not present

## 2022-10-14 DIAGNOSIS — M1612 Unilateral primary osteoarthritis, left hip: Secondary | ICD-10-CM | POA: Diagnosis not present

## 2022-10-14 DIAGNOSIS — F17220 Nicotine dependence, chewing tobacco, uncomplicated: Secondary | ICD-10-CM | POA: Diagnosis not present

## 2022-10-14 DIAGNOSIS — Z79899 Other long term (current) drug therapy: Secondary | ICD-10-CM | POA: Diagnosis not present

## 2022-10-14 DIAGNOSIS — Z7982 Long term (current) use of aspirin: Secondary | ICD-10-CM | POA: Diagnosis not present

## 2022-10-14 DIAGNOSIS — Z96651 Presence of right artificial knee joint: Secondary | ICD-10-CM | POA: Diagnosis not present

## 2022-10-14 DIAGNOSIS — I4891 Unspecified atrial fibrillation: Secondary | ICD-10-CM | POA: Diagnosis not present

## 2022-10-14 LAB — CBC
HCT: 28.8 % — ABNORMAL LOW (ref 39.0–52.0)
Hemoglobin: 9.1 g/dL — ABNORMAL LOW (ref 13.0–17.0)
MCH: 26.2 pg (ref 26.0–34.0)
MCHC: 31.6 g/dL (ref 30.0–36.0)
MCV: 83 fL (ref 80.0–100.0)
Platelets: 255 10*3/uL (ref 150–400)
RBC: 3.47 MIL/uL — ABNORMAL LOW (ref 4.22–5.81)
RDW: 16.7 % — ABNORMAL HIGH (ref 11.5–15.5)
WBC: 15.8 10*3/uL — ABNORMAL HIGH (ref 4.0–10.5)
nRBC: 0 % (ref 0.0–0.2)

## 2022-10-14 LAB — BASIC METABOLIC PANEL
Anion gap: 4 — ABNORMAL LOW (ref 5–15)
BUN: 27 mg/dL — ABNORMAL HIGH (ref 6–20)
CO2: 25 mmol/L (ref 22–32)
Calcium: 8.7 mg/dL — ABNORMAL LOW (ref 8.9–10.3)
Chloride: 106 mmol/L (ref 98–111)
Creatinine, Ser: 0.6 mg/dL — ABNORMAL LOW (ref 0.61–1.24)
GFR, Estimated: >60 mL/min (ref >60)
Glucose, Bld: 151 mg/dL — ABNORMAL HIGH (ref 70–99)
Potassium: 4.7 mmol/L (ref 3.5–5.1)
Sodium: 135 mmol/L (ref 135–145)

## 2022-10-14 MED ORDER — ASPIRIN 81 MG PO CHEW: 81.0000 mg | CHEWABLE_TABLET | Freq: Two times a day (BID) | ORAL | 0 refills | Status: AC

## 2022-10-14 MED ORDER — SENNA 8.6 MG PO TABS: 2.0000 | ORAL_TABLET | Freq: Every day | ORAL | Status: AC

## 2022-10-14 MED ORDER — ONDANSETRON HCL 4 MG PO TABS: 4.0000 mg | ORAL_TABLET | Freq: Three times a day (TID) | ORAL | 0 refills | Status: DC | PRN

## 2022-10-14 MED ORDER — DOCUSATE SODIUM 100 MG PO CAPS
100.0000 mg | ORAL_CAPSULE | Freq: Two times a day (BID) | ORAL | Status: AC
Start: 2022-10-14 — End: 2022-12-13

## 2022-10-14 MED ORDER — POLYETHYLENE GLYCOL 3350 17 G PO PACK: 17.0000 g | PACK | Freq: Every day | ORAL | 0 refills | Status: DC | PRN

## 2022-10-14 MED ORDER — OXYCODONE HCL 20 MG PO TABS: 20.0000 mg | ORAL_TABLET | ORAL | 0 refills | Status: AC | PRN

## 2022-10-14 NOTE — Plan of Care (Signed)
  Problem: Activity: Goal: Ability to avoid complications of mobility impairment will improve Outcome: Progressing   Problem: Pain Management: Goal: Pain level will decrease with appropriate interventions Outcome: Progressing   Problem: Nutrition: Goal: Adequate nutrition will be maintained Outcome: Progressing

## 2022-10-14 NOTE — Progress Notes (Signed)
Physical Therapy Treatment Patient Details Name: Jimmy Black MRN: [id number] DOB: [birth year] Today's Date: 10/14/2022   History of Present Illness Pt s/p L THR and with hx of R TKR (and revision) perhipheral neuropathy, and ETOH abuse    PT Comments  Pt in good spirits and progressing well with mobility.  Pt up to EOB for dressing with min assist only for underwear and cues for sequence.  Pt up to hallway to ambulate increased distance and negotiated stairs.  Will return to initiate HEP.    If plan is discharge home, recommend the following: A little help with walking and/or transfers;A little help with bathing/dressing/bathroom;Assistance with cooking/housework;Help with stairs or ramp for entrance;Assist for transportation   Can travel by private vehicle        Equipment Recommendations  Rolling walker (2 wheels)    Recommendations for Other Services       Precautions / Restrictions Precautions Precautions: Fall Restrictions Weight Bearing Restrictions: No LLE Weight Bearing: Weight bearing as tolerated     Mobility  Bed Mobility Overal bed mobility: Needs Assistance Bed Mobility: Supine to Sit     Supine to sit: Supervision     General bed mobility comments: cues for sequence with use of gait belt to self assist L LE    Transfers Overall transfer level: Needs assistance Equipment used: Rolling walker (2 wheels) Transfers: Sit to/from Stand Sit to Stand: Contact guard assist, Supervision           General transfer comment: cues for LE management and use of UEs to self assist    Ambulation/Gait Ambulation/Gait assistance: Contact guard assist, Supervision Gait Distance (Feet): 180 Feet Assistive device: Rolling walker (2 wheels) Gait Pattern/deviations: Step-to pattern, Decreased step length - right, Decreased step length - left, Shuffle, Trunk flexed Gait velocity: decr     General Gait Details: cues for posture, position from RW and initial  sequence   Stairs Stairs: Yes Stairs assistance: Contact guard assist Stair Management: No rails, Step to pattern, Forwards, With walker Number of Stairs: 1 General stair comments: mild difficulty 2* limited R knee flexion   Wheelchair Mobility     Tilt Bed    Modified Rankin (Stroke Patients Only)       Balance Overall balance assessment: Needs assistance Sitting-balance support: Feet supported, No upper extremity supported Sitting balance-Leahy Scale: Good     Standing balance support: No upper extremity supported Standing balance-Leahy Scale: Poor                              Cognition Arousal: Alert Behavior During Therapy: WFL for tasks assessed/performed Overall Cognitive Status: Within Functional Limits for tasks assessed                                          Exercises      General Comments        Pertinent Vitals/Pain Pain Assessment Pain Assessment: 0-10 Pain Score: 3  Pain Location: L hip Pain Descriptors / Indicators: Aching, Sore Pain Intervention(s): Limited activity within patient's tolerance, Monitored during session, Premedicated before session, Ice applied    Home Living                          Prior Function  PT Goals (current goals can now be found in the care plan section) Acute Rehab PT Goals Patient Stated Goal: Regain IND PT Goal Formulation: With patient Time For Goal Achievement: 10/20/22 Potential to Achieve Goals: Good Progress towards PT goals: Progressing toward goals    Frequency    7X/week      PT Plan      Co-evaluation              AM-PAC PT "6 Clicks" Mobility   Outcome Measure  Help needed turning from your back to your side while in a flat bed without using bedrails?: A Little Help needed moving from lying on your back to sitting on the side of a flat bed without using bedrails?: A Little Help needed moving to and from a bed to a chair  (including a wheelchair)?: A Little Help needed standing up from a chair using your arms (e.g., wheelchair or bedside chair)?: A Little Help needed to walk in hospital room?: A Little Help needed climbing 3-5 steps with a railing? : A Little 6 Click Score: 18    End of Session Equipment Utilized During Treatment: Gait belt Activity Tolerance: Patient tolerated treatment well Patient left: in chair;with call bell/phone within reach;with chair alarm set Nurse Communication: Mobility status PT Visit Diagnosis: Difficulty in walking, not elsewhere classified (R26.2)     Time: 8657-8469 PT Time Calculation (min) (ACUTE ONLY): 25 min  Charges:    $Gait Training: 8-22 mins $Therapeutic Activity: 8-22 mins PT General Charges $$ ACUTE PT VISIT: 1 Visit                     Mauro Kaufmann PT Acute Rehabilitation Services Pager [phone] Office [phone]    Jimmy Black 10/14/2022, 12:52 PM

## 2022-10-14 NOTE — Progress Notes (Signed)
Physical Therapy Treatment Patient Details Name: Jimmy Black MRN: [id number] DOB: [birth year] Today's Date: 10/14/2022   History of Present Illness Pt s/p L THR and with hx of R TKR (and revision) perhipheral neuropathy, and ETOH abuse    PT Comments  Pt continues very cooperative and HEP initiated with written instruction provided and reviewed.  Reviewed car transfers.  Pt eager for dc home this date.    If plan is discharge home, recommend the following: A little help with walking and/or transfers;A little help with bathing/dressing/bathroom;Assistance with cooking/housework;Help with stairs or ramp for entrance;Assist for transportation   Can travel by private vehicle        Equipment Recommendations  Rolling walker (2 wheels)    Recommendations for Other Services       Precautions / Restrictions Precautions Precautions: Fall Restrictions Weight Bearing Restrictions: No LLE Weight Bearing: Weight bearing as tolerated     Mobility  Bed Mobility Overal bed mobility: Needs Assistance Bed Mobility: Supine to Sit     Supine to sit: Supervision     General bed mobility comments: cues for sequence with use of gait belt to self assist L LE    Transfers Overall transfer level: Needs assistance Equipment used: Rolling walker (2 wheels) Transfers: Sit to/from Stand Sit to Stand: Contact guard assist, Supervision           General transfer comment: cues for LE management and use of UEs to self assist    Ambulation/Gait Ambulation/Gait assistance: Contact guard assist, Supervision Gait Distance (Feet): 180 Feet Assistive device: Rolling walker (2 wheels) Gait Pattern/deviations: Step-to pattern, Decreased step length - right, Decreased step length - left, Shuffle, Trunk flexed Gait velocity: decr     General Gait Details: cues for posture, position from RW and initial sequence   Stairs Stairs: Yes Stairs assistance: Contact guard assist Stair  Management: No rails, Step to pattern, Forwards, With walker Number of Stairs: 1 General stair comments: mild difficulty 2* limited R knee flexion   Wheelchair Mobility     Tilt Bed    Modified Rankin (Stroke Patients Only)       Balance Overall balance assessment: Needs assistance Sitting-balance support: Feet supported, No upper extremity supported Sitting balance-Leahy Scale: Good     Standing balance support: No upper extremity supported Standing balance-Leahy Scale: Poor                              Cognition Arousal: Alert Behavior During Therapy: WFL for tasks assessed/performed Overall Cognitive Status: Within Functional Limits for tasks assessed                                          Exercises Total Joint Exercises Ankle Circles/Pumps: AROM, Both, 15 reps, Supine Quad Sets: AROM, Both, 10 reps, Supine Heel Slides: AAROM, Left, 20 reps, Supine Hip ABduction/ADduction: AAROM, Left, 10 reps, Supine Long Arc Quad: AAROM, Left, 10 reps, Seated    General Comments        Pertinent Vitals/Pain Pain Assessment Pain Assessment: 0-10 Pain Score: 3  Pain Location: L hip Pain Descriptors / Indicators: Aching, Sore Pain Intervention(s): Limited activity within patient's tolerance, Monitored during session, Premedicated before session, Ice applied    Home Living  Prior Function            PT Goals (current goals can now be found in the care plan section) Acute Rehab PT Goals Patient Stated Goal: Regain IND PT Goal Formulation: With patient Time For Goal Achievement: 10/20/22 Potential to Achieve Goals: Good Progress towards PT goals: Progressing toward goals    Frequency    7X/week      PT Plan      Co-evaluation              AM-PAC PT "6 Clicks" Mobility   Outcome Measure  Help needed turning from your back to your side while in a flat bed without using bedrails?: A  Little Help needed moving from lying on your back to sitting on the side of a flat bed without using bedrails?: A Little Help needed moving to and from a bed to a chair (including a wheelchair)?: A Little Help needed standing up from a chair using your arms (e.g., wheelchair or bedside chair)?: A Little Help needed to walk in hospital room?: A Little Help needed climbing 3-5 steps with a railing? : A Little 6 Click Score: 18    End of Session Equipment Utilized During Treatment: Gait belt Activity Tolerance: Patient tolerated treatment well Patient left: in chair;with call bell/phone within reach;with chair alarm set Nurse Communication: Mobility status PT Visit Diagnosis: Difficulty in walking, not elsewhere classified (R26.2)     Time: 1914-7829 PT Time Calculation (min) (ACUTE ONLY): 23 min  Charges:    $Gait Training: 8-22 mins $Therapeutic Exercise: 8-22 mins $Therapeutic Activity: 8-22 mins PT General Charges $$ ACUTE PT VISIT: 1 Visit                     Mauro Kaufmann PT Acute Rehabilitation Services Pager [phone] Office [phone]    Kimberly Nieland 10/14/2022, 12:56 PM

## 2022-10-14 NOTE — Plan of Care (Signed)
  Problem: Education: Goal: Knowledge of the prescribed therapeutic regimen will improve Outcome: Adequate for Discharge Goal: Understanding of discharge needs will improve Outcome: Adequate for Discharge Goal: Individualized Educational Video(s) Outcome: Adequate for Discharge   Problem: Activity: Goal: Ability to avoid complications of mobility impairment will improve 10/14/2022 1142 by Delila Spence, LPN Outcome: Adequate for Discharge 10/14/2022 5409 by Delila Spence, LPN Outcome: Progressing Goal: Ability to tolerate increased activity will improve Outcome: Adequate for Discharge   Problem: Clinical Measurements: Goal: Postoperative complications will be avoided or minimized Outcome: Adequate for Discharge   Problem: Pain Management: Goal: Pain level will decrease with appropriate interventions 10/14/2022 1142 by Delila Spence, LPN Outcome: Adequate for Discharge 10/14/2022 0748 by Delila Spence, LPN Outcome: Progressing   Problem: Skin Integrity: Goal: Will show signs of wound healing Outcome: Adequate for Discharge   Problem: Education: Goal: Knowledge of General Education information will improve Description: Including pain rating scale, medication(s)/side effects and non-pharmacologic comfort measures Outcome: Adequate for Discharge   Problem: Health Behavior/Discharge Planning: Goal: Ability to manage health-related needs will improve Outcome: Adequate for Discharge   Problem: Clinical Measurements: Goal: Ability to maintain clinical measurements within normal limits will improve Outcome: Adequate for Discharge Goal: Will remain free from infection Outcome: Adequate for Discharge Goal: Diagnostic test results will improve Outcome: Adequate for Discharge Goal: Respiratory complications will improve Outcome: Adequate for Discharge Goal: Cardiovascular complication will be avoided Outcome: Adequate for Discharge   Problem: Activity: Goal: Risk for  activity intolerance will decrease Outcome: Adequate for Discharge   Problem: Nutrition: Goal: Adequate nutrition will be maintained 10/14/2022 1142 by Alice Rieger A, LPN Outcome: Adequate for Discharge 10/14/2022 0748 by Delila Spence, LPN Outcome: Progressing   Problem: Coping: Goal: Level of anxiety will decrease Outcome: Adequate for Discharge   Problem: Elimination: Goal: Will not experience complications related to bowel motility Outcome: Adequate for Discharge Goal: Will not experience complications related to urinary retention Outcome: Adequate for Discharge   Problem: Pain Managment: Goal: General experience of comfort will improve Outcome: Adequate for Discharge   Problem: Safety: Goal: Ability to remain free from injury will improve Outcome: Adequate for Discharge   Problem: Skin Integrity: Goal: Risk for impaired skin integrity will decrease Outcome: Adequate for Discharge

## 2022-10-14 NOTE — TOC Transition Note (Signed)
Transition of Care [facility]) - CM/SW Discharge Note   Patient Details  Name: Jimmy Black MRN: [id number] Date of Birth: [birth year]  [facility] CM/SW Contact:  Amada Jupiter, LCSW Phone Number: 10/14/2022, 9:58 AM   Clinical Narrative:     Met with pt today and confirming he has received RW to room via [employer].  Plan for HEP.  No further TOC needs.  Final next level of care: Home/Self Care Barriers to Discharge: No Barriers Identified   Patient Goals and CMS Choice      Discharge Placement                         Discharge Plan and Services Additional resources added to the After Visit Summary for                  DME Arranged: Walker rolling DME Agency: [employer]                  Social Determinants of Health (SDOH) Interventions SDOH Screenings   Food Insecurity: No Food Insecurity (10/13/2022)  Housing: Low Risk  (10/13/2022)  Transportation Needs: No Transportation Needs (10/13/2022)  Utilities: Not At Risk (10/13/2022)  Depression (PHQ2-9): Low Risk  (03/18/2022)  Financial Resource Strain: Medium Risk (09/05/2022)  Physical Activity: Sufficiently Active (09/05/2022)  Social Connections: Moderately Integrated (09/05/2022)  Stress: No Stress Concern Present (09/05/2022)  Tobacco Use: High Risk (10/13/2022)     Readmission Risk Interventions     No data to display

## 2022-10-19 ENCOUNTER — Ambulatory Visit: Payer: Medicare PPO | Admitting: Podiatry

## 2022-10-19 NOTE — Discharge Summary (Signed)
Physician Discharge Summary  Patient ID: Jimmy Black MRN: [id number] DOB/AGE: 10/05/1960 62 y.o.  Admit date: 10/13/2022 Discharge date: 10/14/2022  Admission Diagnoses:  Osteoarthritis of left hip  Discharge Diagnoses:  Principal Problem:   Osteoarthritis of left hip   Past Medical History:  Diagnosis Date   Alcohol abuse    Allergic rhinitis 06/21/2015   Anxiety    takes Paxil daily   Anxiety state 03/07/2009   Qualifier: Diagnosis of  By: Moreno-Coll  MD, Adlih     Arthritis of right knee 11/16/2011   Atrial fibrillation (HCC) 11/16/2018   Chicken pox    Chronic neuropathic pain 06/11/2019   Dysrhythmia    Family history of anesthesia complication    brother got sick after anesthesia   History of alcohol abuse 08/17/2011   Hyperlipidemia    takes Fish Oil daily   Hypertension 03/20/2018   Joint pain    Joint swelling    KNEE PAIN, RIGHT 03/07/2009   Qualifier: Diagnosis of  By: Moreno-Coll  MD, Adlih     Numbness    both leg and feet   Obstructive sleep apnea 06/21/2015   Osteoarthritis    "right knee; both hips" (12/03/2012)   Palpitations 11/16/2018   Peripheral neuropathy    Sleep apnea    not started cpap as of 5-6 -2021 pv    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 Bilateral   Splenic laceration 12/21/2016    Surgeries: Procedure(s): TOTAL HIP ARTHROPLASTY ANTERIOR APPROACH on 10/13/2022   Consultants (if any):   Discharged Condition: Improved  Hospital Course: Jimmy Black is an 62 y.o. male who was admitted 10/13/2022 with a diagnosis of Osteoarthritis of left hip and went to the operating room on 10/13/2022 and underwent the above named procedures.    He was given perioperative antibiotics:  Anti-infectives (From admission, onward)    Start     Dose/Rate Route Frequency Ordered Stop   10/13/22 2200  doxycycline (VIBRA-TABS) tablet 100 mg  Status:  Discontinued        100 mg  Oral 2 times daily 10/13/22 1314 10/14/22 1657   10/13/22 1400  ceFAZolin (ANCEF) IVPB 2g/100 mL premix        2 g 200 mL/hr over 30 Minutes Intravenous Every 6 hours 10/13/22 1314 10/13/22 2106   10/13/22 0715  ceFAZolin (ANCEF) IVPB 2g/100 mL premix        2 g 200 mL/hr over 30 Minutes Intravenous On call to O.R. 10/13/22 0347 10/13/22 4259       He was given sequential compression devices, early ambulation, and ASA for DVT prophylaxis.  He benefited maximally from the hospital stay and there were no complications.    Recent vital signs:  Vitals:   10/14/22 0500 10/14/22 1001  BP: 128/72 131/78  Pulse: (!) 55 67  Resp:  18  Temp: 98.7 F (37.1 C) 98.8 F (37.1 C)  SpO2:  100%    Recent laboratory studies:  Lab Results  Component Value Date   HGB 9.1 (L) 10/14/2022   HGB 11.2 (L) 10/04/2022   HGB 11.9 (L) 03/18/2022   Lab Results  Component Value Date   WBC 15.8 (H) 10/14/2022   PLT 255 10/14/2022   Lab Results  Component Value Date   INR 1.03 12/21/2016   Lab Results  Component Value Date   NA 135 10/14/2022   K 4.7 10/14/2022   CL 106 10/14/2022   CO2  25 10/14/2022   BUN 27 (H) 10/14/2022   CREATININE 0.60 (L) 10/14/2022   GLUCOSE 151 (H) 10/14/2022     Allergies as of 10/14/2022   No Known Allergies      Medication List     STOP taking these medications    aspirin EC 81 MG tablet Replaced by: aspirin 81 MG chewable tablet   buprenorphine 8 MG Subl SL tablet Commonly known as: SUBUTEX       TAKE these medications    amLODipine 5 MG tablet Commonly known as: NORVASC Take 1 tablet (5 mg total) by mouth daily.   aspirin 81 MG chewable tablet Commonly known as: Aspirin Childrens Chew 1 tablet (81 mg total) by mouth 2 (two) times daily with a meal. Replaces: aspirin EC 81 MG tablet   docusate sodium 100 MG capsule Commonly known as: Colace Take 1 capsule (100 mg total) by mouth 2 (two) times daily. What changed:  how much to  take when to take this reasons to take this   doxycycline 100 MG capsule Commonly known as: VIBRAMYCIN Take 1 capsule (100 mg total) by mouth 2 (two) times daily for 15 days.   ezetimibe 10 MG tablet Commonly known as: ZETIA TAKE 1 TABLET BY MOUTH EVERY DAY   Fish Oil 500 MG Caps Take 500 mg by mouth 2 (two) times daily.   ibuprofen 200 MG tablet Commonly known as: ADVIL Take 600 mg by mouth 2 (two) times daily as needed for pain.   MENS MULTI VITAMIN & MINERAL PO Take 1 tablet by mouth daily.   neomycin-bacitracin-polymyxin Oint Commonly known as: NEOSPORIN Apply 1 Application topically as needed for wound care.   ondansetron 4 MG tablet Commonly known as: Zofran Take 1 tablet (4 mg total) by mouth every 8 (eight) hours as needed for nausea or vomiting.   Oxycodone HCl 20 MG Tabs Take 1 tablet (20 mg total) by mouth every 4 (four) hours as needed for up to 7 days for severe pain (pain score 7-10).   PARoxetine 20 MG tablet Commonly known as: PAXIL TAKE 1 TABLET BY MOUTH EVERY DAY   polyethylene glycol 17 g packet Commonly known as: MiraLax Take 17 g by mouth daily as needed for moderate constipation or severe constipation.   rosuvastatin 20 MG tablet Commonly known as: CRESTOR TAKE 1 TABLET BY MOUTH EVERY DAY   senna 8.6 MG Tabs tablet Commonly known as: SENOKOT Take 2 tablets (17.2 mg total) by mouth at bedtime.   telmisartan 20 MG tablet Commonly known as: MICARDIS Take 1 tablet (20 mg total) by mouth daily.          WEIGHT BEARING   Weight bearing as tolerated with assist device (walker, cane, etc) as directed, use it as long as suggested by your surgeon or therapist, typically at least 4-6 weeks.   EXERCISES  Results after joint replacement surgery are often greatly improved when you follow the exercise, range of motion and muscle strengthening exercises prescribed by your doctor. Safety measures are also important to protect the joint from  further injury. Any time any of these exercises cause you to have increased pain or swelling, decrease what you are doing until you are comfortable again and then slowly increase them. If you have problems or questions, call your caregiver or physical therapist for advice.   Rehabilitation is important following a joint replacement. After just a few days of immobilization, the muscles of the leg can become weakened and shrink (atrophy).  These exercises are designed to build up the tone and strength of the thigh and leg muscles and to improve motion. Often times heat used for twenty to thirty minutes before working out will loosen up your tissues and help with improving the range of motion but do not use heat for the first two weeks following surgery (sometimes heat can increase post-operative swelling).   These exercises can be done on a training (exercise) mat, on the floor, on a table or on a bed. Use whatever works the best and is most comfortable for you.    Use music or television while you are exercising so that the exercises are a pleasant break in your day. This will make your life better with the exercises acting as a break in your routine that you can look forward to.   Perform all exercises about fifteen times, three times per day or as directed.  You should exercise both the operative leg and the other leg as well.  Exercises include:   Quad Sets - Tighten up the muscle on the front of the thigh (Quad) and hold for 5-10 seconds.   Straight Leg Raises - With your knee straight (if you were given a brace, keep it on), lift the leg to 60 degrees, hold for 3 seconds, and slowly lower the leg.  Perform this exercise against resistance later as your leg gets stronger.  Leg Slides: Lying on your back, slowly slide your foot toward your buttocks, bending your knee up off the floor (only go as far as is comfortable). Then slowly slide your foot back down until your leg is flat on the floor again.   Angel Wings: Lying on your back spread your legs to the side as far apart as you can without causing discomfort.  Hamstring Strength:  Lying on your back, push your heel against the floor with your leg straight by tightening up the muscles of your buttocks.  Repeat, but this time bend your knee to a comfortable angle, and push your heel against the floor.  You may put a pillow under the heel to make it more comfortable if necessary.   A rehabilitation program following joint replacement surgery can speed recovery and prevent re-injury in the future due to weakened muscles. Contact your doctor or a physical therapist for more information on knee rehabilitation.    CONSTIPATION  Constipation is defined medically as fewer than three stools per week and severe constipation as less than one stool per week.  Even if you have a regular bowel pattern at home, your normal regimen is likely to be disrupted due to multiple reasons following surgery.  Combination of anesthesia, postoperative narcotics, change in appetite and fluid intake all can affect your bowels.   YOU MUST use at least one of the following options; they are listed in order of increasing strength to get the job done.  They are all available over the counter, and you may need to use some, POSSIBLY even all of these options:    Drink plenty of fluids (prune juice may be helpful) and high fiber foods Colace 100 mg by mouth twice a day  Senokot for constipation as directed and as needed Dulcolax (bisacodyl), take with full glass of water  Miralax (polyethylene glycol) once or twice a day as needed.  If you have tried all these things and are unable to have a bowel movement in the first 3-4 days after surgery call either your surgeon or your primary doctor.  If you experience loose stools or diarrhea, hold the medications until you stool forms back up.  If your symptoms do not get better within 1 week or if they get worse, check with your  doctor.  If you experience "the worst abdominal pain ever" or develop nausea or vomiting, please contact the office immediately for further recommendations for treatment.   ITCHING:  If you experience itching with your medications, try taking only a single pain pill, or even half a pain pill at a time.  You can also use Benadryl over the counter for itching or also to help with sleep.   TED HOSE STOCKINGS:  Use stockings on both legs until for at least 2 weeks or as directed by physician office. They may be removed at night for sleeping.  MEDICATIONS:  See your medication summary on the "After Visit Summary" that nursing will review with you.  You may have some home medications which will be placed on hold until you complete the course of blood thinner medication.  It is important for you to complete the blood thinner medication as prescribed.  PRECAUTIONS:  If you experience chest pain or shortness of breath - call 911 immediately for transfer to the hospital emergency department.   If you develop a fever greater that 101 F, purulent drainage from wound, increased redness or drainage from wound, foul odor from the wound/dressing, or calf pain - CONTACT YOUR SURGEON.                                                   FOLLOW-UP APPOINTMENTS:  If you do not already have a post-op appointment, please call the office for an appointment to be seen by your surgeon.  Guidelines for how soon to be seen are listed in your "After Visit Summary", but are typically between 1-4 weeks after surgery.  OTHER INSTRUCTIONS:   Knee Replacement:  Do not place pillow under knee, focus on keeping the knee straight while resting. CPM instructions: 0-90 degrees, 2 hours in the morning, 2 hours in the afternoon, and 2 hours in the evening. Place foam block, curve side up under heel at all times except when in CPM or when walking.  DO NOT modify, tear, cut, or change the foam block in any way.   MAKE SURE YOU:  Understand  these instructions.  Get help right away if you are not doing well or get worse.    Thank you for letting us be a part of your medical care team.  It is a privilege we respect greatly.  We hope these instructions will help you stay on track for a fast and full recovery!   Diagnostic Studies: DG HIP UNILAT WITH PELVIS 1V LEFT  Result Date: 10/13/2022 CLINICAL DATA:  Left hip replacement surgery. Intraoperative fluoroscopy. EXAM: DG HIP (WITH OR WITHOUT PELVIS) 1V*L* COMPARISON:  Pelvis and left hip radiographs 09/18/2019 FINDINGS: Images were performed intraoperatively without the presence of a radiologist. Interval total left hip arthroplasty. No hardware complication is seen. Total fluoroscopy images: 2 Total fluoroscopy time: 18 seconds Total dose: Radiation Exposure Index (as provided by the fluoroscopic device): 2.75 mGy air Kerma Please see intraoperative findings for further detail. IMPRESSION: Intraoperative fluoroscopy for total left hip arthroplasty. Electronically Signed   By: Neita Garnet M.D.   On: 10/13/2022 13:48   DG  Pelvis Portable  Result Date: 10/13/2022 CLINICAL DATA:  Primary osteoarthritis of left hip. Postop left hip replacement. EXAM: PORTABLE PELVIS 1-2 VIEWS COMPARISON:  None Available. FINDINGS: Left hip arthroplasty in expected alignment. No periprosthetic lucency or fracture. Recent postsurgical change includes air and edema in the soft tissues. IMPRESSION: Left hip arthroplasty without immediate postoperative complication. Electronically Signed   By: Narda Rutherford M.D.   On: 10/13/2022 13:28   DG C-Arm 1-60 Min-No Report  Result Date: 10/13/2022 Fluoroscopy was utilized by the requesting physician.  No radiographic interpretation.   DG C-Arm 1-60 Min-No Report  Result Date: 10/13/2022 Fluoroscopy was utilized by the requesting physician.  No radiographic interpretation.    Disposition: Discharge disposition: 01-Home or Self Care       Discharge  Instructions     Call MD / Call 911   Complete by: As directed    If you experience chest pain or shortness of breath, CALL 911 and be transported to the hospital emergency room.  If you develope a fever above 101 F, pus (white drainage) or increased drainage or redness at the wound, or calf pain, call your surgeon's office.   Constipation Prevention   Complete by: As directed    Drink plenty of fluids.  Prune juice may be helpful.  You may use a stool softener, such as Colace (over the counter) 100 mg twice a day.  Use MiraLax (over the counter) for constipation as needed.   Diet - low sodium heart healthy   Complete by: As directed    Driving restrictions   Complete by: As directed    No driving for 4 weeks   Increase activity slowly as tolerated   Complete by: As directed    Lifting restrictions   Complete by: As directed    No lifting for 6 weeks   Post-operative opioid taper instructions:   Complete by: As directed    POST-OPERATIVE OPIOID TAPER INSTRUCTIONS: It is important to wean off of your opioid medication as soon as possible. If you do not need pain medication after your surgery it is ok to stop day one. Opioids include: Codeine, Hydrocodone(Norco, Vicodin), Oxycodone(Percocet, oxycontin) and hydromorphone amongst others.  Long term and even short term use of opiods can cause: Increased pain response Dependence Constipation Depression Respiratory depression And more.  Withdrawal symptoms can include Flu like symptoms Nausea, vomiting And more Techniques to manage these symptoms Hydrate well Eat regular healthy meals Stay active Use relaxation techniques(deep breathing, meditating, yoga) Do Not substitute Alcohol to help with tapering If you have been on opioids for less than two weeks and do not have pain than it is ok to stop all together.  Plan to wean off of opioids This plan should start within one week post op of your joint replacement. Maintain the same  interval or time between taking each dose and first decrease the dose.  Cut the total daily intake of opioids by one tablet each day Next start to increase the time between doses. The last dose that should be eliminated is the evening dose.      TED hose   Complete by: As directed    Use stockings (TED hose) for 2 weeks on both leg(s).  You may remove them at night for sleeping.        Follow-up Information     Clois Dupes, [place]. Go on 10/25/2022.   Specialty: Orthopedic Surgery Why: You are scheduled for first post op appt on Tuesday  Sept 3 at 11:00am. Contact information: [address] [place] [address] [phone]                  Signed: Iline Oven Luman Holway 10/19/2022, 8:11 AM

## 2022-10-21 ENCOUNTER — Telehealth: Payer: Self-pay

## 2022-10-21 NOTE — Telephone Encounter (Signed)
Left message for pt to call [phone] to r/s appt.

## 2022-10-23 ENCOUNTER — Encounter: Payer: Self-pay | Admitting: Family Medicine

## 2022-10-23 DIAGNOSIS — N50819 Testicular pain, unspecified: Secondary | ICD-10-CM

## 2022-10-25 DIAGNOSIS — Z471 Aftercare following joint replacement surgery: Secondary | ICD-10-CM | POA: Diagnosis not present

## 2022-10-25 DIAGNOSIS — Z96642 Presence of left artificial hip joint: Secondary | ICD-10-CM | POA: Diagnosis not present

## 2022-10-26 ENCOUNTER — Ambulatory Visit: Payer: Medicare PPO | Admitting: Gastroenterology

## 2022-11-08 DIAGNOSIS — G894 Chronic pain syndrome: Secondary | ICD-10-CM | POA: Diagnosis not present

## 2022-11-08 DIAGNOSIS — Z79891 Long term (current) use of opiate analgesic: Secondary | ICD-10-CM | POA: Diagnosis not present

## 2022-11-08 DIAGNOSIS — M545 Low back pain, unspecified: Secondary | ICD-10-CM | POA: Diagnosis not present

## 2022-11-08 DIAGNOSIS — G629 Polyneuropathy, unspecified: Secondary | ICD-10-CM | POA: Diagnosis not present

## 2022-11-08 DIAGNOSIS — M1612 Unilateral primary osteoarthritis, left hip: Secondary | ICD-10-CM | POA: Diagnosis not present

## 2022-11-16 ENCOUNTER — Ambulatory Visit: Payer: Medicare PPO | Admitting: Gastroenterology

## 2022-11-18 ENCOUNTER — Encounter: Payer: Self-pay | Admitting: Family Medicine

## 2022-11-18 DIAGNOSIS — L97519 Non-pressure chronic ulcer of other part of right foot with unspecified severity: Secondary | ICD-10-CM

## 2022-11-29 ENCOUNTER — Ambulatory Visit: Payer: Medicare PPO | Admitting: Gastroenterology

## 2022-12-06 DIAGNOSIS — M545 Low back pain, unspecified: Secondary | ICD-10-CM | POA: Diagnosis not present

## 2022-12-06 DIAGNOSIS — G894 Chronic pain syndrome: Secondary | ICD-10-CM | POA: Diagnosis not present

## 2022-12-06 DIAGNOSIS — Z79891 Long term (current) use of opiate analgesic: Secondary | ICD-10-CM | POA: Diagnosis not present

## 2022-12-06 DIAGNOSIS — M1612 Unilateral primary osteoarthritis, left hip: Secondary | ICD-10-CM | POA: Diagnosis not present

## 2022-12-06 DIAGNOSIS — G629 Polyneuropathy, unspecified: Secondary | ICD-10-CM | POA: Diagnosis not present

## 2022-12-08 ENCOUNTER — Other Ambulatory Visit: Payer: Self-pay | Admitting: Family Medicine

## 2022-12-21 NOTE — Progress Notes (Unsigned)
Chief Complaint: Primary GI MD: Gentry Fitz  HPI: 62 year old male history of A-fib (not on anticoagulation), anxiety, hypertension, and other medical history as listed below presents for evaluation of anemia.  Seen by PCP August 2024 and at that time he was getting ready to have hip replacement surgery but had preop labs 10/05/2022 with hemoglobin 11.2 with MCV of 81.  Iron 27, saturation 7%, ferritin 10.  No previous history of colonoscopy.   Recent lab work 10/14/2022 (completed 1 day after left total hip replacement) Hgb 9.1, MCV 83 BUN 27, creatinine 0.60  PREVIOUS GI WORKUP   Echocardiogram February 2021 with ejection fraction of 55 to 60% and moderate increased left ventricular hypertrophy  Past Medical History:  Diagnosis Date   Alcohol abuse    Allergic rhinitis 06/21/2015   Anxiety    takes Paxil daily   Anxiety state 03/07/2009   Qualifier: Diagnosis of  By: Moreno-Coll  MD, Adlih     Arthritis of right knee 11/16/2011   Atrial fibrillation (HCC) 11/16/2018   Chicken pox    Chronic neuropathic pain 06/11/2019   Dysrhythmia    Family history of anesthesia complication    brother got sick after anesthesia   History of alcohol abuse 08/17/2011   Hyperlipidemia    takes Fish Oil daily   Hypertension 03/20/2018   Joint pain    Joint swelling    KNEE PAIN, RIGHT 03/07/2009   Qualifier: Diagnosis of  By: Moreno-Coll  MD, Adlih     Numbness    both leg and feet   Obstructive sleep apnea 06/21/2015   Osteoarthritis    "right knee; both hips" (12/03/2012)   Palpitations 11/16/2018   Peripheral neuropathy    Sleep apnea    not started cpap as of 5-6 -2021 pv    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 Bilateral   Splenic laceration 12/21/2016    Past Surgical History:  Procedure Laterality Date   EXCISIONAL TOTAL KNEE ARTHROPLASTY Right 12/03/2012   Procedure: EXCISIONAL TOTAL KNEE ARTHROPLASTY (POLY SWAP), SCAR  TISSUE REMOVAL, QUADRICEPSPLASTY;  Surgeon: Dannielle Huh, MD;  Location: [facility] OR;  Service: Orthopedics;  Laterality: Right;   INCISION AND DRAINAGE OF WOUND Right 2006   "w/jet lavage; had to do this twice after the OR" (12/03/2012)   KNEE SURGERY Right 1967, 1973, 1980   "benign tumor removal" (12/03/2012)   SHOULDER ARTHROSCOPY WITH OPEN ROTATOR CUFF REPAIR Right 2006   SHOULDER ARTHROSCOPY WITH OPEN ROTATOR CUFF REPAIR Left 06/2012   SPERMATOCELECTOMY     TOTAL HIP ARTHROPLASTY Left 10/13/2022   Procedure: TOTAL HIP ARTHROPLASTY ANTERIOR APPROACH;  Surgeon: Samson Frederic, MD;  Location: [facility];  Service: Orthopedics;  Laterality: Left;  120   TOTAL KNEE ARTHROPLASTY  11/16/2011   Procedure: TOTAL KNEE ARTHROPLASTY;  Surgeon: Nestor Lewandowsky, MD;  Location: [facility] OR;  Service: Orthopedics;  Laterality: Right;  right total knee arthroplasty   TOTAL KNEE REVISION WITH SCAR DEBRIDEMENT/PATELLA REVISION WITH POLY EXCHANGE Right 12/03/2012   "not sure what they did exactly" (10/'13/2014)    Current Outpatient Medications  Medication Sig Dispense Refill   amLODipine (NORVASC) 5 MG tablet TAKE 1 TABLET(5 MG) BY MOUTH DAILY 30 tablet 2   ezetimibe (ZETIA) 10 MG tablet TAKE 1 TABLET BY MOUTH EVERY DAY 90 tablet 0   ibuprofen (ADVIL,MOTRIN) 200 MG tablet Take 600 mg by mouth 2 (two) times daily as needed for pain.     Multiple Vitamins-Minerals (  MENS MULTI VITAMIN & MINERAL PO) Take 1 tablet by mouth daily.      neomycin-bacitracin-polymyxin (NEOSPORIN) OINT Apply 1 Application topically as needed for wound care.     Omega-3 Fatty Acids (FISH OIL) 500 MG CAPS Take 500 mg by mouth 2 (two) times daily.     ondansetron (ZOFRAN) 4 MG tablet Take 1 tablet (4 mg total) by mouth every 8 (eight) hours as needed for nausea or vomiting. 20 tablet 0   PARoxetine (PAXIL) 20 MG tablet TAKE 1 TABLET BY MOUTH EVERY DAY 90 tablet 3   polyethylene glycol (MIRALAX) 17 g packet Take 17 g by mouth daily as needed for moderate  constipation or severe constipation. 14 each 0   rosuvastatin (CRESTOR) 20 MG tablet TAKE 1 TABLET BY MOUTH EVERY DAY 90 tablet 1   telmisartan (MICARDIS) 20 MG tablet Take 1 tablet (20 mg total) by mouth daily. 90 tablet 3   No current facility-administered medications for this visit.    Allergies as of 12/22/2022   (No Known Allergies)    Family History  Problem Relation Age of Onset   Cancer Mother 31       breast   Hyperlipidemia Mother    Breast cancer Mother    Heart disease Father    Cancer Father        prostate   Prostate cancer Father    Hyperlipidemia Brother    Cancer Maternal Aunt        breast   Breast cancer Maternal Aunt    Alcohol abuse Paternal Aunt    Heart disease Maternal Grandfather    Alcohol abuse Paternal Grandfather    Heart disease Paternal Grandfather    Breast cancer Cousin    Colon cancer Neg Hx    Colon polyps Neg Hx    Esophageal cancer Neg Hx    Rectal cancer Neg Hx    Stomach cancer Neg Hx     Social History   Socioeconomic History   Marital status: Divorced    Spouse name: Not on file   Number of children: Not on file   Years of education: Not on file   Highest education level: Associate degree: occupational, Scientist, product/process development, or vocational program  Occupational History   Not on file  Tobacco Use   Smoking status: Never   Smokeless tobacco: Current    Types: Snuff, Chew  Vaping Use   Vaping status: Never Used  Substance and Sexual Activity   Alcohol use: Not Currently    Comment: 12/03/2012 "quit all alcohol in 1998; family hx of problems w/it"   Drug use: No   Sexual activity: Not Currently  Other Topics Concern   Not on file  Social History Narrative   Not on file   Social Determinants of Health   Financial Resource Strain: Medium Risk (09/05/2022)   Overall Financial Resource Strain (CARDIA)    Difficulty of Paying Living Expenses: Somewhat hard  Food Insecurity: No Food Insecurity (10/13/2022)   Hunger Vital Sign     Worried About Running Out of Food in the Last Year: Never true    Ran Out of Food in the Last Year: Never true  Transportation Needs: No Transportation Needs (10/13/2022)   PRAPARE - Administrator, Civil Service (Medical): No    Lack of Transportation (Non-Medical): No  Physical Activity: Sufficiently Active (09/05/2022)   Exercise Vital Sign    Days of Exercise per Week: 4 days    Minutes of Exercise  per Session: 40 min  Stress: No Stress Concern Present (09/05/2022)   [employer] of Occupational Health - Occupational Stress Questionnaire    Feeling of Stress : Only a little  Social Connections: Moderately Integrated (09/05/2022)   Social Connection and Isolation Panel [NHANES]    Frequency of Communication with Friends and Family: Twice a week    Frequency of Social Gatherings with Friends and Family: Once a week    Attends Religious Services: More than 4 times per year    Active Member of [employer] or Organizations: Yes    Attends Engineer, structural: More than 4 times per year    Marital Status: Divorced  Intimate Partner Violence: Not At Risk (10/13/2022)   Humiliation, Afraid, Rape, and Kick questionnaire    Fear of Current or Ex-Partner: No    Emotionally Abused: No    Physically Abused: No    Sexually Abused: No    Review of Systems:    Constitutional: No weight loss, fever, chills, weakness or fatigue HEENT: Eyes: No change in vision               Ears, Nose, Throat:  No change in hearing or congestion Skin: No rash or itching Cardiovascular: No chest pain, chest pressure or palpitations   Respiratory: No SOB or cough Gastrointestinal: See HPI and otherwise negative Genitourinary: No dysuria or change in urinary frequency Neurological: No headache, dizziness or syncope Musculoskeletal: No new muscle or joint pain Hematologic: No bleeding or bruising Psychiatric: No history of depression or anxiety    Physical Exam:  Vital signs: There were no  vitals taken for this visit.  Constitutional: NAD, Well developed, Well nourished, alert and cooperative Head:  Normocephalic and atraumatic. Eyes:   PEERL, EOMI. No icterus. Conjunctiva pink. Respiratory: Respirations even and unlabored. Lungs clear to auscultation bilaterally.   No wheezes, crackles, or rhonchi.  Cardiovascular:  Regular rate and rhythm. No peripheral edema, cyanosis or pallor.  Gastrointestinal:  Soft, nondistended, nontender. No rebound or guarding. Normal bowel sounds. No appreciable masses or hepatomegaly. Rectal:  Not performed.  Msk:  Symmetrical without gross deformities. Without edema, no deformity or joint abnormality.  Neurologic:  Alert and  oriented x4;  grossly normal neurologically.  Skin:   Dry and intact without significant lesions or rashes. Psychiatric: Oriented to person, place and time. Demonstrates good judgement and reason without abnormal affect or behaviors.   RELEVANT LABS AND IMAGING: CBC    Component Value Date/Time   WBC 15.8 (H) 10/14/2022 0327   RBC 3.47 (L) 10/14/2022 0327   HGB 9.1 (L) 10/14/2022 0327   HCT 28.8 (L) 10/14/2022 0327   PLT 255 10/14/2022 0327   MCV 83.0 10/14/2022 0327   MCH 26.2 10/14/2022 0327   MCHC 31.6 10/14/2022 0327   RDW 16.7 (H) 10/14/2022 0327   LYMPHSABS 1.9 03/18/2022 1107   MONOABS 0.6 03/18/2022 1107   EOSABS 0.2 03/18/2022 1107   BASOSABS 0.1 03/18/2022 1107    CMP     Component Value Date/Time   NA 135 10/14/2022 0327   K 4.7 10/14/2022 0327   CL 106 10/14/2022 0327   CO2 25 10/14/2022 0327   GLUCOSE 151 (H) 10/14/2022 0327   BUN 27 (H) 10/14/2022 0327   CREATININE 0.60 (L) 10/14/2022 0327   CALCIUM 8.7 (L) 10/14/2022 0327   PROT 7.1 03/18/2022 1107   ALBUMIN 4.6 03/18/2022 1107   AST 25 03/18/2022 1107   ALT 23 03/18/2022 1107   ALKPHOS 58  03/18/2022 1107   BILITOT 0.6 03/18/2022 1107   GFRNONAA >60 10/14/2022 0327   GFRAA >60 12/23/2016 0356     Assessment/Plan:        [facility] 12/21/2022, 10:43 AM  Cc: Kristian Covey, MD

## 2022-12-22 ENCOUNTER — Encounter: Payer: Self-pay | Admitting: Gastroenterology

## 2022-12-22 ENCOUNTER — Other Ambulatory Visit: Payer: Medicare PPO

## 2022-12-22 ENCOUNTER — Ambulatory Visit: Payer: Medicare PPO | Admitting: Gastroenterology

## 2022-12-22 VITALS — BP 130/80 | HR 73 | Ht 78.0 in | Wt 232.0 lb

## 2022-12-22 DIAGNOSIS — Z791 Long term (current) use of non-steroidal anti-inflammatories (NSAID): Secondary | ICD-10-CM

## 2022-12-22 DIAGNOSIS — D509 Iron deficiency anemia, unspecified: Secondary | ICD-10-CM | POA: Diagnosis not present

## 2022-12-22 DIAGNOSIS — Z1211 Encounter for screening for malignant neoplasm of colon: Secondary | ICD-10-CM

## 2022-12-22 LAB — CBC WITH DIFFERENTIAL/PLATELET
Basophils Absolute: 0.1 K/uL (ref 0.0–0.1)
Basophils Relative: 1.1 % (ref 0.0–3.0)
Eosinophils Absolute: 0.1 K/uL (ref 0.0–0.7)
Eosinophils Relative: 1.9 % (ref 0.0–5.0)
HCT: 36.8 % — ABNORMAL LOW (ref 39.0–52.0)
Hemoglobin: 11.6 g/dL — ABNORMAL LOW (ref 13.0–17.0)
Lymphocytes Relative: 19.3 % (ref 12.0–46.0)
Lymphs Abs: 1.5 K/uL (ref 0.7–4.0)
MCHC: 31.6 g/dL (ref 30.0–36.0)
MCV: 80.6 fl (ref 78.0–100.0)
Monocytes Absolute: 0.7 K/uL (ref 0.1–1.0)
Monocytes Relative: 8.3 % (ref 3.0–12.0)
Neutro Abs: 5.6 K/uL (ref 1.4–7.7)
Neutrophils Relative %: 69.4 % (ref 43.0–77.0)
Platelets: 299 K/uL (ref 150.0–400.0)
RBC: 4.56 Mil/uL (ref 4.22–5.81)
RDW: 18 % — ABNORMAL HIGH (ref 11.5–15.5)
WBC: 8 K/uL (ref 4.0–10.5)

## 2022-12-22 LAB — COMPREHENSIVE METABOLIC PANEL WITH GFR
ALT: 21 U/L (ref 0–53)
AST: 28 U/L (ref 0–37)
Albumin: 4.6 g/dL (ref 3.5–5.2)
Alkaline Phosphatase: 94 U/L (ref 39–117)
BUN: 26 mg/dL — ABNORMAL HIGH (ref 6–23)
CO2: 27 meq/L (ref 19–32)
Calcium: 9.8 mg/dL (ref 8.4–10.5)
Chloride: 101 meq/L (ref 96–112)
Creatinine, Ser: 0.71 mg/dL (ref 0.40–1.50)
GFR: 99.94 mL/min
Glucose, Bld: 92 mg/dL (ref 70–99)
Potassium: 4.4 meq/L (ref 3.5–5.1)
Sodium: 137 meq/L (ref 135–145)
Total Bilirubin: 0.4 mg/dL (ref 0.2–1.2)
Total Protein: 7.4 g/dL (ref 6.0–8.3)

## 2022-12-22 LAB — IBC + FERRITIN
Ferritin: 14.8 ng/mL — ABNORMAL LOW (ref 22.0–322.0)
Iron: 36 ug/dL — ABNORMAL LOW (ref 42–165)
Saturation Ratios: 7.3 % — ABNORMAL LOW (ref 20.0–50.0)
TIBC: 492.8 ug/dL — ABNORMAL HIGH (ref 250.0–450.0)
Transferrin: 352 mg/dL (ref 212.0–360.0)

## 2022-12-22 MED ORDER — NA SULFATE-K SULFATE-MG SULF 17.5-3.13-1.6 GM/177ML PO SOLN: 1.0000 | Freq: Once | ORAL | 0 refills | Status: AC

## 2022-12-22 NOTE — Progress Notes (Signed)
I agree with the assessment and plan as outlined by Ms. McMichael. 

## 2022-12-22 NOTE — Patient Instructions (Addendum)
You have been scheduled for an endoscopy and colonoscopy. Please follow the written instructions given to you at your visit today.  Please pick up your prep supplies at the pharmacy within the next 1-3 days.  If you use inhalers (even only as needed), please bring them with you on the day of your procedure.  DO NOT TAKE 7 DAYS PRIOR TO TEST- Trulicity (dulaglutide) Ozempic, Wegovy (semaglutide) Mounjaro (tirzepatide) Bydureon Bcise (exanatide extended release)  DO NOT TAKE 1 DAY PRIOR TO YOUR TEST Rybelsus (semaglutide) Adlyxin (lixisenatide) Victoza (liraglutide) Byetta (exanatide) ___________________________________________________________________________  Your provider has requested that you go to the basement level for lab work before leaving today. Press "B" on the elevator. The lab is located at the first door on the left as you exit the elevator.   Due to recent changes in healthcare laws, you may see the results of your imaging and laboratory studies on MyChart before your provider has had a chance to review them.  We understand that in some cases there may be results that are confusing or concerning to you. Not all laboratory results come back in the same time frame and the provider may be waiting for multiple results in order to interpret others.  Please give [place] 48 hours in order for your provider to thoroughly review all the results before contacting the office for clarification of your results.   I appreciate the  opportunity to care for you  Thank You   [facility]

## 2022-12-26 DIAGNOSIS — Z96642 Presence of left artificial hip joint: Secondary | ICD-10-CM | POA: Diagnosis not present

## 2022-12-26 DIAGNOSIS — Z471 Aftercare following joint replacement surgery: Secondary | ICD-10-CM | POA: Diagnosis not present

## 2023-01-03 DIAGNOSIS — Z79891 Long term (current) use of opiate analgesic: Secondary | ICD-10-CM | POA: Diagnosis not present

## 2023-01-03 DIAGNOSIS — M1612 Unilateral primary osteoarthritis, left hip: Secondary | ICD-10-CM | POA: Diagnosis not present

## 2023-01-03 DIAGNOSIS — M545 Low back pain, unspecified: Secondary | ICD-10-CM | POA: Diagnosis not present

## 2023-01-03 DIAGNOSIS — G894 Chronic pain syndrome: Secondary | ICD-10-CM | POA: Diagnosis not present

## 2023-01-03 DIAGNOSIS — G629 Polyneuropathy, unspecified: Secondary | ICD-10-CM | POA: Diagnosis not present

## 2023-01-13 ENCOUNTER — Other Ambulatory Visit: Payer: Self-pay | Admitting: Family Medicine

## 2023-01-26 ENCOUNTER — Encounter: Payer: Self-pay | Admitting: Internal Medicine

## 2023-01-26 ENCOUNTER — Ambulatory Visit: Payer: Medicare PPO | Admitting: Internal Medicine

## 2023-01-26 VITALS — BP 161/89 | HR 62 | Temp 98.1°F | Resp 11 | Ht 78.0 in | Wt 232.0 lb

## 2023-01-26 DIAGNOSIS — D509 Iron deficiency anemia, unspecified: Secondary | ICD-10-CM

## 2023-01-26 DIAGNOSIS — G4733 Obstructive sleep apnea (adult) (pediatric): Secondary | ICD-10-CM | POA: Diagnosis not present

## 2023-01-26 DIAGNOSIS — K257 Chronic gastric ulcer without hemorrhage or perforation: Secondary | ICD-10-CM

## 2023-01-26 DIAGNOSIS — F419 Anxiety disorder, unspecified: Secondary | ICD-10-CM | POA: Diagnosis not present

## 2023-01-26 DIAGNOSIS — K319 Disease of stomach and duodenum, unspecified: Secondary | ICD-10-CM | POA: Diagnosis not present

## 2023-01-26 DIAGNOSIS — I10 Essential (primary) hypertension: Secondary | ICD-10-CM | POA: Diagnosis not present

## 2023-01-26 DIAGNOSIS — Z1211 Encounter for screening for malignant neoplasm of colon: Secondary | ICD-10-CM | POA: Diagnosis not present

## 2023-01-26 DIAGNOSIS — D122 Benign neoplasm of ascending colon: Secondary | ICD-10-CM | POA: Diagnosis not present

## 2023-01-26 DIAGNOSIS — K3189 Other diseases of stomach and duodenum: Secondary | ICD-10-CM | POA: Diagnosis not present

## 2023-01-26 DIAGNOSIS — I4891 Unspecified atrial fibrillation: Secondary | ICD-10-CM | POA: Diagnosis not present

## 2023-01-26 DIAGNOSIS — K648 Other hemorrhoids: Secondary | ICD-10-CM | POA: Diagnosis not present

## 2023-01-26 DIAGNOSIS — K259 Gastric ulcer, unspecified as acute or chronic, without hemorrhage or perforation: Secondary | ICD-10-CM | POA: Diagnosis not present

## 2023-01-26 MED ORDER — SODIUM CHLORIDE 0.9 % IV SOLN: 500.0000 mL | INTRAVENOUS | Status: DC

## 2023-01-26 MED ORDER — PANTOPRAZOLE SODIUM 40 MG PO TBEC: 40.0000 mg | DELAYED_RELEASE_TABLET | Freq: Two times a day (BID) | ORAL | 3 refills | Status: DC

## 2023-01-26 NOTE — Progress Notes (Signed)
Vss nad trans to pacu 

## 2023-01-26 NOTE — Progress Notes (Signed)
Called to room to assist during endoscopic procedure.  Patient ID and intended procedure confirmed with present staff. Received instructions for my participation in the procedure from the performing physician.  

## 2023-01-26 NOTE — Patient Instructions (Signed)
- Await pathology results. - It is suspected that your gastric ulcer is the source of your iron deficiency anemia. - Use Protonix (pantoprazole) 40 mg PO BID for 3 months. - Avoid NSAID use. - Repeat upper endoscopy in 3 months to check healing.   YOU HAD AN ENDOSCOPIC PROCEDURE TODAY AT THE [facility]:   Refer to the procedure report that was given to you for any specific questions about what was found during the examination.  If the procedure report does not answer your questions, please call your gastroenterologist to clarify.  If you requested that your care partner not be given the details of your procedure findings, then the procedure report has been included in a sealed envelope for you to review at your convenience later.  YOU SHOULD EXPECT: Some feelings of bloating in the abdomen. Passage of more gas than usual.  Walking can help get rid of the air that was put into your GI tract during the procedure and reduce the bloating. If you had a lower endoscopy (such as a colonoscopy or flexible sigmoidoscopy) you may notice spotting of blood in your stool or on the toilet paper. If you underwent a bowel prep for your procedure, you may not have a normal bowel movement for a few days.  Please Note:  You might notice some irritation and congestion in your nose or some drainage.  This is from the oxygen used during your procedure.  There is no need for concern and it should clear up in a day or so.  SYMPTOMS TO REPORT IMMEDIATELY:  Following lower endoscopy (colonoscopy or flexible sigmoidoscopy):  Excessive amounts of blood in the stool  Significant tenderness or worsening of abdominal pains  Swelling of the abdomen that is new, acute  Fever of 100F or higher  Following upper endoscopy (EGD)  Vomiting of blood or coffee ground material  New chest pain or pain under the shoulder blades  Painful or persistently difficult swallowing  New shortness of breath  Fever of 100F or  higher  Black, tarry-looking stools  For urgent or emergent issues, a gastroenterologist can be reached at any hour by calling [phone]. Do not use MyChart messaging for urgent concerns.    DIET:  We do recommend a small meal at first, but then you may proceed to your regular diet.  Drink plenty of fluids but you should avoid alcoholic beverages for 24 hours.  ACTIVITY:  You should plan to take it easy for the rest of today and you should NOT DRIVE or use heavy machinery until tomorrow (because of the sedation medicines used during the test).    FOLLOW UP: Our staff will call the number listed on your records the next business day following your procedure.  We will call around 7:15- 8:00 am to check on you and address any questions or concerns that you may have regarding the information given to you following your procedure. If we do not reach you, we will leave a message.     If any biopsies were taken you will be contacted by phone or by letter within the next 1-3 weeks.  Please call us at [phone] if you have not heard about the biopsies in 3 weeks.    SIGNATURES/CONFIDENTIALITY: You and/or your care partner have signed paperwork which will be entered into your electronic medical record.  These signatures attest to the fact that that the information above on your After Visit Summary has been reviewed and is understood.  Full responsibility of the confidentiality of this discharge information lies with you and/or your care-partner.

## 2023-01-26 NOTE — Op Note (Signed)
[facility] Patient Name: Jimmy Black Procedure Date: 01/26/2023 1:40 PM MRN: [id number] Endoscopist: Particia Lather , , [id number] Age: 62 Referring MD:  Date of Birth: 1960-05-21 Gender: Male Account #: [id number] Procedure:                Upper GI endoscopy Indications:              Iron deficiency anemia Medicines:                Monitored Anesthesia Care Procedure:                Pre-Anesthesia Assessment:                           - Prior to the procedure, a History and Physical                            was performed, and patient medications and                            allergies were reviewed. The patient's tolerance of                            previous anesthesia was also reviewed. The risks                            and benefits of the procedure and the sedation                            options and risks were discussed with the patient.                            All questions were answered, and informed consent                            was obtained. Prior Anticoagulants: The patient has                            taken no anticoagulant or antiplatelet agents. ASA                            Grade Assessment: II - A patient with mild systemic                            disease. After reviewing the risks and benefits,                            the patient was deemed in satisfactory condition to                            undergo the procedure.                           After obtaining informed consent, the endoscope was  passed under direct vision. Throughout the                            procedure, the patient's blood pressure, pulse, and                            oxygen saturations were monitored continuously. The                            Olympus Scope [id number] was introduced through the                            mouth, and advanced to the second part of duodenum.                            The upper GI  endoscopy was accomplished without                            difficulty. The patient tolerated the procedure                            well. Scope In: Scope Out: Findings:                 The examined esophagus was normal.                           One non-bleeding cratered gastric ulcer with a                            clean ulcer base (Forrest Class III) was found in                            the gastric antrum. The lesion was 15 mm in largest                            dimension. Biopsies were taken of the edges of the                            ulcer and the rest of the stomach with a cold                            forceps for histology.                           The examined duodenum was normal. Biopsies were                            taken with a cold forceps for histology. Complications:            No immediate complications. Estimated Blood Loss:     Estimated blood loss was minimal. Impression:               - Normal esophagus.                           -  Non-bleeding gastric ulcer with a clean ulcer                            base (Forrest Class III). Biopsied.                           - Normal examined duodenum. Biopsied. Recommendation:           - Await pathology results.                           - It is suspected that your gastric ulcer is the                            source of your iron deficiency anemia.                           - Use Protonix (pantoprazole) 40 mg PO BID for 3                            months.                           - Avoid NSAID use.                           - Repeat upper endoscopy in 3 months to check                            healing.                           - Perform a colonoscopy today. Dr Particia Lather "Alan Ripper" Leonides Schanz,  01/26/2023 2:44:01 PM

## 2023-01-26 NOTE — Op Note (Signed)
[facility] Patient Name: Jimmy Black Procedure Date: 01/26/2023 1:40 PM MRN: [id number] Endoscopist: Particia Lather , , [id number] Age: 62 Referring MD:  Date of Birth: Aug 23, 1960 Gender: Male Account #: [id number] Procedure:                Colonoscopy Indications:              Iron deficiency anemia Medicines:                Monitored Anesthesia Care Procedure:                Pre-Anesthesia Assessment:                           - Prior to the procedure, a History and Physical                            was performed, and patient medications and                            allergies were reviewed. The patient's tolerance of                            previous anesthesia was also reviewed. The risks                            and benefits of the procedure and the sedation                            options and risks were discussed with the patient.                            All questions were answered, and informed consent                            was obtained. Prior Anticoagulants: The patient has                            taken no anticoagulant or antiplatelet agents. ASA                            Grade Assessment: II - A patient with mild systemic                            disease. After reviewing the risks and benefits,                            the patient was deemed in satisfactory condition to                            undergo the procedure.                           After obtaining informed consent, the colonoscope  was passed under direct vision. Throughout the                            procedure, the patient's blood pressure, pulse, and                            oxygen saturations were monitored continuously. The                            Olympus Scope SN: [id number] was introduced through                            the anus and advanced to the the cecum, identified                            by appendiceal orifice  and ileocecal valve. The                            colonoscopy was performed without difficulty. The                            patient tolerated the procedure well. The quality                            of the bowel preparation was good. The ileocecal                            valve, appendiceal orifice, and rectum were                            photographed. Scope In: 2:03:45 PM Scope Out: 2:35:45 PM Scope Withdrawal Time: 0 hours 18 minutes 2 seconds  Total Procedure Duration: 0 hours 32 minutes 0 seconds  Findings:                 A 4 mm polyp was found in the ascending colon. The                            polyp was sessile. The polyp was removed with a                            cold snare. Resection and retrieval were complete.                           Non-bleeding internal hemorrhoids were found during                            retroflexion. Complications:            No immediate complications. Estimated Blood Loss:     Estimated blood loss was minimal. Impression:               - One 4 mm polyp in the ascending colon, removed  with a cold snare. Resected and retrieved.                           - Non-bleeding internal hemorrhoids. Recommendation:           - Discharge patient to home (with escort).                           - Await pathology results.                           - The findings and recommendations were discussed                            with the patient. Dr Particia Lather "Alan Ripper" Leonides Schanz,  01/26/2023 2:51:16 PM

## 2023-01-26 NOTE — Progress Notes (Signed)
GASTROENTEROLOGY PROCEDURE H&P NOTE   Primary Care Physician: Kristian Covey, MD    Reason for Procedure:   IDA, colon cancer screening  Plan:    EGD/colonoscopy  Patient is appropriate for endoscopic procedure(s) in the ambulatory ([facility]) setting.  The nature of the procedure, as well as the risks, benefits, and alternatives were carefully and thoroughly reviewed with the patient. Ample time for discussion and questions allowed. The patient understood, was satisfied, and agreed to proceed.     HPI: Jimmy Black is a 62 y.o. male who presents for EGD/colonoscopy for evaluation of IDA and colon cancer screening .  Patient was most recently seen in the Gastroenterology Clinic on 12/22/22.  No interval change in medical history since that appointment. Please refer to that note for full details regarding GI history and clinical presentation.   Past Medical History:  Diagnosis Date   Alcohol abuse    Allergic rhinitis 06/21/2015   Anxiety    takes Paxil daily   Anxiety state 03/07/2009   Qualifier: Diagnosis of  By: Moreno-Coll  MD, Adlih     Arthritis of right knee 11/16/2011   Atrial fibrillation (HCC) 11/16/2018   Chicken pox    Chronic neuropathic pain 06/11/2019   Dysrhythmia    Family history of anesthesia complication    brother got sick after anesthesia   History of alcohol abuse 08/17/2011   Hyperlipidemia    takes Fish Oil daily   Hypertension 03/20/2018   Joint pain    Joint swelling    KNEE PAIN, RIGHT 03/07/2009   Qualifier: Diagnosis of  By: Moreno-Coll  MD, Adlih     Numbness    both leg and feet   Obstructive sleep apnea 06/21/2015   Osteoarthritis    "right knee; both hips" (12/03/2012)   Palpitations 11/16/2018   Peripheral neuropathy    Sleep apnea    not started cpap as of 5-6 -2021 pv    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 Bilateral   Splenic laceration 12/21/2016    Past  Surgical History:  Procedure Laterality Date   EXCISIONAL TOTAL KNEE ARTHROPLASTY Right 12/03/2012   Procedure: EXCISIONAL TOTAL KNEE ARTHROPLASTY (POLY SWAP), SCAR TISSUE REMOVAL, QUADRICEPSPLASTY;  Surgeon: Dannielle Huh, MD;  Location: [facility];  Service: Orthopedics;  Laterality: Right;   INCISION AND DRAINAGE OF WOUND Right 2006   "w/jet lavage; had to do this twice after the OR" (12/03/2012)   KNEE SURGERY Right 1967, 1973, 1980   "benign tumor removal" (12/03/2012)   SHOULDER ARTHROSCOPY WITH OPEN ROTATOR CUFF REPAIR Right 2006   SHOULDER ARTHROSCOPY WITH OPEN ROTATOR CUFF REPAIR Left 06/2012   SPERMATOCELECTOMY     TOTAL HIP ARTHROPLASTY Left 10/13/2022   Procedure: TOTAL HIP ARTHROPLASTY ANTERIOR APPROACH;  Surgeon: Samson Frederic, MD;  Location: [facility];  Service: Orthopedics;  Laterality: Left;  120   TOTAL KNEE ARTHROPLASTY  11/16/2011   Procedure: TOTAL KNEE ARTHROPLASTY;  Surgeon: Nestor Lewandowsky, MD;  Location: [facility];  Service: Orthopedics;  Laterality: Right;  right total knee arthroplasty   TOTAL KNEE REVISION WITH SCAR DEBRIDEMENT/PATELLA REVISION WITH POLY EXCHANGE Right 12/03/2012   "not sure what they did exactly" (10/'13/2014)    Prior to Admission medications   Medication Sig Start Date End Date Taking? Authorizing Provider  amLODipine (NORVASC) 5 MG tablet TAKE 1 TABLET(5 MG) BY MOUTH DAILY 01/13/23  Yes Burchette, Elberta Fortis, MD  buprenorphine (SUBUTEX) 8 MG SUBL SL  tablet Place 8 mg under the tongue daily.   Yes [provider]  ibuprofen (ADVIL,MOTRIN) 200 MG tablet Take 600 mg by mouth 2 (two) times daily as needed for pain.   Yes [provider]  Multiple Vitamins-Minerals (MENS MULTI VITAMIN & MINERAL PO) Take 1 tablet by mouth daily.    Yes [provider]  PARoxetine (PAXIL) 20 MG tablet TAKE 1 TABLET BY MOUTH EVERY DAY 03/21/22  Yes Burchette, Elberta Fortis, MD  rosuvastatin (CRESTOR) 20 MG tablet TAKE 1 TABLET BY MOUTH EVERY DAY 04/08/22  Yes  Burchette, Elberta Fortis, MD  ezetimibe (ZETIA) 10 MG tablet TAKE 1 TABLET BY MOUTH EVERY DAY Patient not taking: Reported on 01/26/2023 07/04/22   Burchette, Elberta Fortis, MD  Omega-3 Fatty Acids (FISH OIL) 500 MG CAPS Take 500 mg by mouth 2 (two) times daily.    [provider]  telmisartan (MICARDIS) 20 MG tablet Take 1 tablet (20 mg total) by mouth daily. 02/08/22   Georgeanna Lea, MD    Current Outpatient Medications  Medication Sig Dispense Refill   amLODipine (NORVASC) 5 MG tablet TAKE 1 TABLET(5 MG) BY MOUTH DAILY 30 tablet 2   buprenorphine (SUBUTEX) 8 MG SUBL SL tablet Place 8 mg under the tongue daily.     ibuprofen (ADVIL,MOTRIN) 200 MG tablet Take 600 mg by mouth 2 (two) times daily as needed for pain.     Multiple Vitamins-Minerals (MENS MULTI VITAMIN & MINERAL PO) Take 1 tablet by mouth daily.      PARoxetine (PAXIL) 20 MG tablet TAKE 1 TABLET BY MOUTH EVERY DAY 90 tablet 3   rosuvastatin (CRESTOR) 20 MG tablet TAKE 1 TABLET BY MOUTH EVERY DAY 90 tablet 1   ezetimibe (ZETIA) 10 MG tablet TAKE 1 TABLET BY MOUTH EVERY DAY (Patient not taking: Reported on 01/26/2023) 90 tablet 0   Omega-3 Fatty Acids (FISH OIL) 500 MG CAPS Take 500 mg by mouth 2 (two) times daily.     telmisartan (MICARDIS) 20 MG tablet Take 1 tablet (20 mg total) by mouth daily. 90 tablet 3   Current Facility-Administered Medications  Medication Dose Route Frequency Provider Last Rate Last Admin   0.9 %  sodium chloride infusion  500 mL Intravenous Continuous Imogene Burn, MD        Allergies as of 01/26/2023   (No Known Allergies)    Family History  Problem Relation Age of Onset   Cancer Mother 28       breast   Hyperlipidemia Mother    Breast cancer Mother    Heart disease Father    Cancer Father        prostate   Prostate cancer Father    Hyperlipidemia Brother    Cancer Maternal Aunt        breast   Breast cancer Maternal Aunt    Alcohol abuse Paternal Aunt    Heart disease Maternal  Grandfather    Alcohol abuse Paternal Grandfather    Heart disease Paternal Grandfather    Breast cancer Cousin    Colon cancer Neg Hx    Colon polyps Neg Hx    Esophageal cancer Neg Hx    Rectal cancer Neg Hx    Stomach cancer Neg Hx     Social History   Socioeconomic History   Marital status: Divorced    Spouse name: Not on file   Number of children: Not on file   Years of education: Not on file   Highest  education level: Associate degree: occupational, Scientist, product/process development, or vocational program  Occupational History   Not on file  Tobacco Use   Smoking status: Never   Smokeless tobacco: Current    Types: Snuff, Chew  Vaping Use   Vaping status: Never Used  Substance and Sexual Activity   Alcohol use: Not Currently    Comment: 12/03/2012 "quit all alcohol in 2000; family hx of problems w/it"   Drug use: No   Sexual activity: Not Currently  Other Topics Concern   Not on file  Social History Narrative   Not on file   Social Determinants of Health   Financial Resource Strain: Medium Risk (09/05/2022)   Overall Financial Resource Strain (CARDIA)    Difficulty of Paying Living Expenses: Somewhat hard  Food Insecurity: No Food Insecurity (10/13/2022)   Hunger Vital Sign    Worried About Running Out of Food in the Last Year: Never true    Ran Out of Food in the Last Year: Never true  Transportation Needs: No Transportation Needs (10/13/2022)   PRAPARE - Administrator, Civil Service (Medical): No    Lack of Transportation (Non-Medical): No  Physical Activity: Sufficiently Active (09/05/2022)   Exercise Vital Sign    Days of Exercise per Week: 4 days    Minutes of Exercise per Session: 40 min  Stress: No Stress Concern Present (09/05/2022)   [employer] of Occupational Health - Occupational Stress Questionnaire    Feeling of Stress : Only a little  Social Connections: Moderately Integrated (09/05/2022)   Social Connection and Isolation Panel [NHANES]    Frequency  of Communication with Friends and Family: Twice a week    Frequency of Social Gatherings with Friends and Family: Once a week    Attends Religious Services: More than 4 times per year    Active Member of [employer] or Organizations: Yes    Attends Engineer, structural: More than 4 times per year    Marital Status: Divorced  Intimate Partner Violence: Not At Risk (10/13/2022)   Humiliation, Afraid, Rape, and Kick questionnaire    Fear of Current or Ex-Partner: No    Emotionally Abused: No    Physically Abused: No    Sexually Abused: No    Physical Exam: Vital signs in last 24 hours: BP (!) 155/86   Pulse 80   Temp 98.1 F (36.7 C) (Temporal)   Ht 6\' 6"  (1.981 m)   Wt 232 lb (105.2 kg)   SpO2 100%   BMI 26.81 kg/m  GEN: NAD EYE: Sclerae anicteric ENT: MMM CV: Non-tachycardic Pulm: No increased WOB GI: Soft NEURO:  Alert & Oriented   Eulah Pont, MD [facility]   01/26/2023 1:30 PM

## 2023-01-27 ENCOUNTER — Telehealth: Payer: Self-pay

## 2023-01-27 NOTE — Telephone Encounter (Signed)
FU call placed, VM obtained and message left.

## 2023-02-02 ENCOUNTER — Encounter: Payer: Self-pay | Admitting: Internal Medicine

## 2023-02-02 DIAGNOSIS — Z79891 Long term (current) use of opiate analgesic: Secondary | ICD-10-CM | POA: Diagnosis not present

## 2023-02-02 DIAGNOSIS — G629 Polyneuropathy, unspecified: Secondary | ICD-10-CM | POA: Diagnosis not present

## 2023-02-02 DIAGNOSIS — G894 Chronic pain syndrome: Secondary | ICD-10-CM | POA: Diagnosis not present

## 2023-02-02 DIAGNOSIS — M1612 Unilateral primary osteoarthritis, left hip: Secondary | ICD-10-CM | POA: Diagnosis not present

## 2023-02-02 DIAGNOSIS — M545 Low back pain, unspecified: Secondary | ICD-10-CM | POA: Diagnosis not present

## 2023-02-02 LAB — SURGICAL PATHOLOGY

## 2023-03-09 DIAGNOSIS — Z79891 Long term (current) use of opiate analgesic: Secondary | ICD-10-CM | POA: Diagnosis not present

## 2023-03-09 DIAGNOSIS — M545 Low back pain, unspecified: Secondary | ICD-10-CM | POA: Diagnosis not present

## 2023-03-09 DIAGNOSIS — M1612 Unilateral primary osteoarthritis, left hip: Secondary | ICD-10-CM | POA: Diagnosis not present

## 2023-03-09 DIAGNOSIS — G894 Chronic pain syndrome: Secondary | ICD-10-CM | POA: Diagnosis not present

## 2023-03-09 DIAGNOSIS — G629 Polyneuropathy, unspecified: Secondary | ICD-10-CM | POA: Diagnosis not present

## 2023-04-21 ENCOUNTER — Encounter: Payer: Medicare PPO | Admitting: Internal Medicine

## 2023-04-22 ENCOUNTER — Other Ambulatory Visit: Payer: Self-pay | Admitting: Family Medicine

## 2023-04-25 DIAGNOSIS — Z79891 Long term (current) use of opiate analgesic: Secondary | ICD-10-CM | POA: Diagnosis not present

## 2023-04-25 DIAGNOSIS — G629 Polyneuropathy, unspecified: Secondary | ICD-10-CM | POA: Diagnosis not present

## 2023-04-25 DIAGNOSIS — M545 Low back pain, unspecified: Secondary | ICD-10-CM | POA: Diagnosis not present

## 2023-04-25 DIAGNOSIS — G894 Chronic pain syndrome: Secondary | ICD-10-CM | POA: Diagnosis not present

## 2023-04-25 DIAGNOSIS — M1612 Unilateral primary osteoarthritis, left hip: Secondary | ICD-10-CM | POA: Diagnosis not present

## 2023-05-08 ENCOUNTER — Other Ambulatory Visit: Payer: Self-pay | Admitting: Family Medicine

## 2023-05-10 ENCOUNTER — Other Ambulatory Visit: Payer: Self-pay | Admitting: Cardiology

## 2023-05-25 DIAGNOSIS — M545 Low back pain, unspecified: Secondary | ICD-10-CM | POA: Diagnosis not present

## 2023-05-25 DIAGNOSIS — G894 Chronic pain syndrome: Secondary | ICD-10-CM | POA: Diagnosis not present

## 2023-05-25 DIAGNOSIS — G629 Polyneuropathy, unspecified: Secondary | ICD-10-CM | POA: Diagnosis not present

## 2023-05-25 DIAGNOSIS — Z79891 Long term (current) use of opiate analgesic: Secondary | ICD-10-CM | POA: Diagnosis not present

## 2023-05-25 DIAGNOSIS — M1612 Unilateral primary osteoarthritis, left hip: Secondary | ICD-10-CM | POA: Diagnosis not present

## 2023-05-31 ENCOUNTER — Encounter: Payer: Self-pay | Admitting: Cardiology

## 2023-05-31 DIAGNOSIS — I426 Alcoholic cardiomyopathy: Secondary | ICD-10-CM | POA: Diagnosis not present

## 2023-05-31 DIAGNOSIS — F10130 Alcohol abuse with withdrawal, uncomplicated: Secondary | ICD-10-CM | POA: Diagnosis not present

## 2023-05-31 DIAGNOSIS — F1120 Opioid dependence, uncomplicated: Secondary | ICD-10-CM | POA: Diagnosis not present

## 2023-05-31 DIAGNOSIS — F1010 Alcohol abuse, uncomplicated: Secondary | ICD-10-CM | POA: Diagnosis not present

## 2023-05-31 DIAGNOSIS — R4781 Slurred speech: Secondary | ICD-10-CM | POA: Diagnosis not present

## 2023-05-31 NOTE — Progress Notes (Deleted)
  Cardiology Office Note:  .   Date:  05/31/2023  ID:  Jimmy Black, DOB 04/24/1960, MRN [id number] PCP: Jimmy Covey, MD  [facility] Providers Cardiologist:  Jimmy Herrlich, MD { Click to update primary MD,subspecialty MD or APP then REFRESH:1}   History of Present Illness: .   Jimmy Black Jimmy Black is a 63 y.o. 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  04/01/2019 echo EF 55 to 60%, moderately increased LVH, grade 2 DD, mild LA enlargement, moderate dilatation of the ascending aorta at 43 mm >> follow-up CT revealed normal caliber of aorta  Most recently evaluated by Dr. Dulce Sellar on 09/16/2021, he was doing well, had recently lost weight and made a lot of lifestyle changes, was no longer requiring CPAP, and his lipids were well-controlled.  He was advised follow-up in 1 year.  01/26/2023 232  ROS: ***  Studies Reviewed: .        *** Risk Assessment/Calculations:   {Does this patient have ATRIAL FIBRILLATION?:[id number]} No BP recorded.  {Refresh Note OR Click here to enter BP  :1}***       Physical Exam:   VS:  There were no vitals taken for this visit.   Wt Readings from Last 3 Encounters:  01/26/23 232 lb (105.2 kg)  12/22/22 232 lb (105.2 kg)  10/13/22 230 lb 12.8 oz (104.7 kg)    GEN: Well nourished, well developed in no acute distress NECK: No JVD; No carotid bruits CARDIAC: ***RRR, no murmurs, rubs, gallops RESPIRATORY:  Clear to auscultation without rales, wheezing or rhonchi  ABDOMEN: Soft, non-tender, non-distended EXTREMITIES:  No edema; No deformity   ASSESSMENT AND PLAN: .   ***    {Are you ordering a CV Procedure (e.g. stress test, cath, DCCV, TEE, etc)?   Press F2        :[id number]}  Dispo: ***  Signed, Flossie Dibble, NP

## 2023-06-01 ENCOUNTER — Ambulatory Visit: Admitting: Cardiology

## 2023-06-01 DIAGNOSIS — D6859 Other primary thrombophilia: Secondary | ICD-10-CM

## 2023-06-01 DIAGNOSIS — F1120 Opioid dependence, uncomplicated: Secondary | ICD-10-CM | POA: Diagnosis not present

## 2023-06-01 DIAGNOSIS — I4891 Unspecified atrial fibrillation: Secondary | ICD-10-CM | POA: Diagnosis not present

## 2023-06-01 DIAGNOSIS — I361 Nonrheumatic tricuspid (valve) insufficiency: Secondary | ICD-10-CM | POA: Diagnosis not present

## 2023-06-01 DIAGNOSIS — J90 Pleural effusion, not elsewhere classified: Secondary | ICD-10-CM | POA: Diagnosis not present

## 2023-06-01 DIAGNOSIS — I429 Cardiomyopathy, unspecified: Secondary | ICD-10-CM | POA: Diagnosis not present

## 2023-06-01 DIAGNOSIS — I10 Essential (primary) hypertension: Secondary | ICD-10-CM

## 2023-06-01 DIAGNOSIS — I480 Paroxysmal atrial fibrillation: Secondary | ICD-10-CM

## 2023-06-01 DIAGNOSIS — I639 Cerebral infarction, unspecified: Secondary | ICD-10-CM | POA: Diagnosis not present

## 2023-06-01 DIAGNOSIS — I340 Nonrheumatic mitral (valve) insufficiency: Secondary | ICD-10-CM | POA: Diagnosis not present

## 2023-06-01 DIAGNOSIS — F10130 Alcohol abuse with withdrawal, uncomplicated: Secondary | ICD-10-CM | POA: Diagnosis not present

## 2023-06-01 DIAGNOSIS — I426 Alcoholic cardiomyopathy: Secondary | ICD-10-CM | POA: Diagnosis not present

## 2023-06-01 DIAGNOSIS — F1010 Alcohol abuse, uncomplicated: Secondary | ICD-10-CM | POA: Diagnosis not present

## 2023-06-01 DIAGNOSIS — R4781 Slurred speech: Secondary | ICD-10-CM | POA: Diagnosis not present

## 2023-06-02 ENCOUNTER — Other Ambulatory Visit: Payer: Self-pay | Admitting: Cardiology

## 2023-06-02 DIAGNOSIS — I429 Cardiomyopathy, unspecified: Secondary | ICD-10-CM | POA: Diagnosis not present

## 2023-06-02 DIAGNOSIS — R4781 Slurred speech: Secondary | ICD-10-CM | POA: Diagnosis not present

## 2023-06-02 DIAGNOSIS — I340 Nonrheumatic mitral (valve) insufficiency: Secondary | ICD-10-CM | POA: Diagnosis not present

## 2023-06-02 DIAGNOSIS — I5022 Chronic systolic (congestive) heart failure: Secondary | ICD-10-CM | POA: Diagnosis not present

## 2023-06-02 DIAGNOSIS — I10 Essential (primary) hypertension: Secondary | ICD-10-CM | POA: Diagnosis not present

## 2023-06-02 DIAGNOSIS — I426 Alcoholic cardiomyopathy: Secondary | ICD-10-CM | POA: Diagnosis not present

## 2023-06-02 DIAGNOSIS — F1120 Opioid dependence, uncomplicated: Secondary | ICD-10-CM | POA: Diagnosis not present

## 2023-06-02 DIAGNOSIS — F10120 Alcohol abuse with intoxication, uncomplicated: Secondary | ICD-10-CM | POA: Diagnosis not present

## 2023-06-02 DIAGNOSIS — F10130 Alcohol abuse with withdrawal, uncomplicated: Secondary | ICD-10-CM | POA: Diagnosis not present

## 2023-06-02 DIAGNOSIS — G4733 Obstructive sleep apnea (adult) (pediatric): Secondary | ICD-10-CM | POA: Diagnosis not present

## 2023-06-02 DIAGNOSIS — I361 Nonrheumatic tricuspid (valve) insufficiency: Secondary | ICD-10-CM | POA: Diagnosis not present

## 2023-06-02 DIAGNOSIS — I480 Paroxysmal atrial fibrillation: Secondary | ICD-10-CM | POA: Diagnosis not present

## 2023-06-02 DIAGNOSIS — I4891 Unspecified atrial fibrillation: Secondary | ICD-10-CM | POA: Diagnosis not present

## 2023-06-02 DIAGNOSIS — I110 Hypertensive heart disease with heart failure: Secondary | ICD-10-CM | POA: Diagnosis not present

## 2023-06-02 DIAGNOSIS — R7401 Elevation of levels of liver transaminase levels: Secondary | ICD-10-CM | POA: Diagnosis not present

## 2023-06-02 DIAGNOSIS — F1010 Alcohol abuse, uncomplicated: Secondary | ICD-10-CM | POA: Diagnosis not present

## 2023-06-02 NOTE — Telephone Encounter (Signed)
 Prescription sent to pharmacy.

## 2023-06-07 ENCOUNTER — Ambulatory Visit: Admitting: Cardiology

## 2023-06-07 ENCOUNTER — Telehealth (HOSPITAL_COMMUNITY): Payer: Self-pay | Admitting: Licensed Clinical Social Worker

## 2023-06-07 NOTE — Telephone Encounter (Signed)
 The therapist calls Jimmy Black confirming his identity via two identifiers. He says that his dog is about to be brought to his home at which time "flood gates will be open" so takes this therapist's direct number down saying that he will call him back tomorrow when it is convenient for him to talk.  Melynda Stagger, MA, LCSW, [facility], LCAS 06/07/2023

## 2023-06-08 ENCOUNTER — Telehealth: Payer: Self-pay | Admitting: *Deleted

## 2023-06-08 ENCOUNTER — Encounter: Payer: Self-pay | Admitting: *Deleted

## 2023-06-08 DIAGNOSIS — F19939 Other psychoactive substance use, unspecified with withdrawal, unspecified: Secondary | ICD-10-CM | POA: Insufficient documentation

## 2023-06-08 DIAGNOSIS — I160 Hypertensive urgency: Secondary | ICD-10-CM | POA: Insufficient documentation

## 2023-06-08 NOTE — Telephone Encounter (Signed)
 Attempt to reach pt for pre-visit. LM with call back #.  Will attempt to reach again in 5 min due to no other # listed in profile  Second attempt to reach pt for pre-vist unsuccessful. LM with facility # for pt to call back. Instructed pt to call # given by end of the day and reschedule the pre-visit  with RN or the scheduled procedure will be canceled.

## 2023-06-09 ENCOUNTER — Telehealth: Payer: Self-pay

## 2023-06-09 ENCOUNTER — Encounter: Payer: Self-pay | Admitting: Cardiology

## 2023-06-09 ENCOUNTER — Ambulatory Visit: Attending: Cardiology | Admitting: Cardiology

## 2023-06-09 VITALS — BP 90/70 | HR 73 | Ht 78.0 in | Wt 230.0 lb

## 2023-06-09 DIAGNOSIS — I480 Paroxysmal atrial fibrillation: Secondary | ICD-10-CM

## 2023-06-09 DIAGNOSIS — G4733 Obstructive sleep apnea (adult) (pediatric): Secondary | ICD-10-CM | POA: Diagnosis not present

## 2023-06-09 DIAGNOSIS — I426 Alcoholic cardiomyopathy: Secondary | ICD-10-CM

## 2023-06-09 DIAGNOSIS — F1010 Alcohol abuse, uncomplicated: Secondary | ICD-10-CM

## 2023-06-09 DIAGNOSIS — I10 Essential (primary) hypertension: Secondary | ICD-10-CM

## 2023-06-09 NOTE — Patient Instructions (Addendum)
 Medication Instructions:   STOP: Diltiazem   Lab Work: Your physician recommends that you return for lab work in: 3-4 weeks You need to have labs done when you are fasting.  You can come Monday through Friday 8:30 am to 12:00 pm and 1:15 to 4:30. You do not need to make an appointment as the order has already been placed. The labs you are going to have done are CMP   Testing/Procedures: None Ordered   Follow-Up: At [facility], you and your health needs are our priority.  As part of our continuing mission to provide you with exceptional heart care, we have created designated Provider Care Teams.  These Care Teams include your primary Cardiologist (physician) and Advanced Practice Providers (APPs -  Physician Assistants and Nurse Practitioners) who all work together to provide you with the care you need, when you need it.  We recommend signing up for the patient portal called "MyChart".  Sign up information is provided on this After Visit Summary.  MyChart is used to connect with patients for Virtual Visits (Telemedicine).  Patients are able to view lab/test results, encounter notes, upcoming appointments, etc.  Non-urgent messages can be sent to your provider as well.   To learn more about what you can do with MyChart, go to [email or web address].    Your next appointment:   3-4 week(s)  The format for your next appointment:   In Person  Provider:   Ralene Burger, MD    Other Instructions [facility]- They will call when to start test

## 2023-06-09 NOTE — Telephone Encounter (Signed)
 Patient agreement reviewed and signed on 06/09/2023.  WatchPAT issued to patient on 06/09/2023 by Remus Carmine, RN. Patient aware to not open the WatchPAT box until contacted with the activation PIN. Patient profile initialized in CloudPAT on 06/09/2023 by Shawnee Dellen, RN. Device serial number: [id number]  Please list Reason for Call as Advice Only and type "WatchPAT issued to patient" in the comment box.

## 2023-06-09 NOTE — Addendum Note (Signed)
 Addended by: Shawnee Dellen D on: 06/09/2023 10:59 AM   Modules accepted: Orders

## 2023-06-09 NOTE — Progress Notes (Signed)
 Cardiology Office Note:    Date:  06/09/2023   ID:  Jimmy Black, DOB [birth year], MRN [id number]  PCP:  Marquetta Sit, MD  Cardiologist:  Ralene Burger, MD    Referring MD: Marquetta Sit, MD   Chief Complaint  Patient presents with   Hospitalization Follow-up    History of Present Illness:    Jimmy Black is a 63 y.o. male past medical history significant for alcohol  abuse, anxiety, paroxysmal atrial fibrillation.  He been sober for more than 20 years however recently had relapsed with his alcohol  he started drinking for about 3 months and eventually end up coming to the hospital with confusion there was some question about stroke however likely he did not have it however he was fine to be in atrial fibrillation fast ventricular rate currently.  Also have alcohol  withdrawal managed excellently by hospitalist team.  He was also found to have severe cardiomyopathy with ejection fraction 20 to 25%.  Etiology of this phenomenon could be alcohol  abuse and alcoholic or tachycardia induced cardiomyopathy.  He was discharged home actually left hospital 2 days ago.  Doing well stay away from alcohol  is participating in aggressive therapy for his alcoholism and staying sober.  He comes to the office today with his brother and both of them participate in decision-making.  Overall he is feeling fine he complained of having some weakness and fatigue when he gets up dizzy but no falls.  No syncope.  Past Medical History:  Diagnosis Date   Alcohol  abuse    Allergic rhinitis 06/21/2015   Anxiety    takes Paxil  daily   Anxiety state 03/07/2009   Qualifier: Diagnosis of  By: Moreno-Coll  MD, Adlih     Arthritis of right knee 11/16/2011   Atrial fibrillation (HCC) 11/16/2018   Chicken pox    Chronic neuropathic pain 06/11/2019   Dysrhythmia    Family history of anesthesia complication    brother got sick after anesthesia   History of alcohol  abuse 08/17/2011    Hyperlipidemia    takes Fish Oil daily   Hypertension 03/20/2018   Joint pain    Joint swelling    KNEE PAIN, RIGHT 03/07/2009   Qualifier: Diagnosis of  By: Moreno-Coll  MD, Adlih     Numbness    both leg and feet   Obstructive sleep apnea 06/21/2015   Osteoarthritis    "right knee; both hips" (12/03/2012)   Palpitations 11/16/2018   Peripheral neuropathy    Sleep apnea    not started cpap as of 5-6 -2021 pv    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 Bilateral   Splenic laceration 12/21/2016    Past Surgical History:  Procedure Laterality Date   EXCISIONAL TOTAL KNEE ARTHROPLASTY Right 12/03/2012   Procedure: EXCISIONAL TOTAL KNEE ARTHROPLASTY (POLY SWAP), SCAR TISSUE REMOVAL, QUADRICEPSPLASTY;  Surgeon: Christie Cox, MD;  Location: [facility];  Service: Orthopedics;  Laterality: Right;   INCISION AND DRAINAGE OF WOUND Right 2006   "w/jet lavage; had to do this twice after the OR" (12/03/2012)   KNEE SURGERY Right 1967, 1973, 1980   "benign tumor removal" (12/03/2012)   SHOULDER ARTHROSCOPY WITH OPEN ROTATOR CUFF REPAIR Right 2006   SHOULDER ARTHROSCOPY WITH OPEN ROTATOR CUFF REPAIR Left 06/2012   SPERMATOCELECTOMY     TOTAL HIP ARTHROPLASTY Left 10/13/2022   Procedure: TOTAL HIP ARTHROPLASTY ANTERIOR APPROACH;  Surgeon: Adonica Hoose, MD;  Location: [facility];  Service:  Orthopedics;  Laterality: Left;  120   TOTAL KNEE ARTHROPLASTY  11/16/2011   Procedure: TOTAL KNEE ARTHROPLASTY;  Surgeon: Ilean Mall, MD;  Location: [facility];  Service: Orthopedics;  Laterality: Right;  right total knee arthroplasty   TOTAL KNEE REVISION WITH SCAR DEBRIDEMENT/PATELLA REVISION WITH POLY EXCHANGE Right 12/03/2012   "not sure what they did exactly" (10/'13/2014)    Current Medications: Current Meds  Medication Sig   buprenorphine  (SUBUTEX ) 8 MG SUBL SL tablet Place 8 mg under the tongue daily.   carvedilol (COREG) 12.5 MG tablet Take 12.5 mg by mouth 2  (two) times daily.   diltiazem (CARDIZEM) 30 MG tablet Take 30 mg by mouth 2 (two) times daily.   ELIQUIS 5 MG TABS tablet Take 5 mg by mouth 2 (two) times daily.   ENTRESTO 24-26 MG Take 1 tablet by mouth 2 (two) times daily.   PARoxetine  (PAXIL ) 20 MG tablet TAKE 1 TABLET BY MOUTH EVERY DAY   spironolactone (ALDACTONE) 25 MG tablet Take 12.5 mg by mouth daily.   thiamine (VITAMIN B1) 100 MG tablet Take 100 mg by mouth daily.     Allergies:   Patient has no known allergies.   Social History   Socioeconomic History   Marital status: Divorced    Spouse name: Not on file   Number of children: Not on file   Years of education: Not on file   Highest education level: Associate degree: occupational, Scientist, product/process development, or vocational program  Occupational History   Not on file  Tobacco Use   Smoking status: Never   Smokeless tobacco: Current    Types: Snuff, Chew  Vaping Use   Vaping status: Never Used  Substance and Sexual Activity   Alcohol  use: Not Currently    Comment: 12/03/2012 "quit all alcohol  in 2000; family hx of problems w/it"   Drug use: No   Sexual activity: Not Currently  Other Topics Concern   Not on file  Social History Narrative   Not on file   Social Drivers of Health   Financial Resource Strain: Medium Risk (09/05/2022)   Overall Financial Resource Strain (CARDIA)    Difficulty of Paying Living Expenses: Somewhat hard  Food Insecurity: No Food Insecurity (10/13/2022)   Hunger Vital Sign    Worried About Running Out of Food in the Last Year: Never true    Ran Out of Food in the Last Year: Never true  Transportation Needs: No Transportation Needs (10/13/2022)   PRAPARE - Transportation    Lack of Transportation (Medical): No    Lack of Transportation (Non-Medical): No  Physical Activity: Sufficiently Active (09/05/2022)   Exercise Vital Sign    Days of Exercise per Week: 4 days    Minutes of Exercise per Session: 40 min  Stress: No Stress Concern Present (09/05/2022)    [employer] of Occupational Health - Occupational Stress Questionnaire    Feeling of Stress : Only a little  Social Connections: Moderately Integrated (09/05/2022)   Social Connection and Isolation Panel [NHANES]    Frequency of Communication with Friends and Family: Twice a week    Frequency of Social Gatherings with Friends and Family: Once a week    Attends Religious Services: More than 4 times per year    Active Member of [employer] or Organizations: Yes    Attends Engineer, structural: More than 4 times per year    Marital Status: Divorced     Family History: The patient's family history includes Alcohol  abuse  in his paternal aunt and paternal grandfather; Breast cancer in his cousin, maternal aunt, and mother; Cancer in his father and maternal aunt; Cancer (age of onset: 51) in his mother; Heart disease in his father, maternal grandfather, and paternal grandfather; Hyperlipidemia in his brother and mother; Prostate cancer in his father. There is no history of Colon cancer, Colon polyps, Esophageal cancer, Rectal cancer, or Stomach cancer. ROS:   Please see the history of present illness.    All 14 point review of systems negative except as described per history of present illness  EKGs/Labs/Other Studies Reviewed:    EKG Interpretation Date/Time:  Friday June 09 2023 09:48:35 EDT Ventricular Rate:  73 PR Interval:    QRS Duration:  92 QT Interval:  414 QTC Calculation: 456 R Axis:   52  Text Interpretation: Atrial fibrillation Abnormal ECG When compared with ECG of 04-Oct-2022 09:31, Atrial fibrillation has replaced Sinus rhythm Vent. rate has increased BY  26 BPM Confirmed by Ralene Burger [phone]) on 06/09/2023 10:05:44 AM    Recent Labs: 12/22/2022: ALT 21; BUN 26; Creatinine, Ser 0.71; Hemoglobin 11.6; Platelets 299.0; Potassium 4.4; Sodium 137  Recent Lipid Panel    Component Value Date/Time   CHOL 143 03/18/2022 1107   TRIG 91.0 03/18/2022 1107   HDL  49.90 03/18/2022 1107   CHOLHDL 3 03/18/2022 1107   VLDL 18.2 03/18/2022 1107   LDLCALC 75 03/18/2022 1107   LDLDIRECT 110.0 03/20/2018 1206    Physical Exam:    VS:  BP 90/70   Pulse 73   Ht 6\' 6"  (1.981 m)   Wt 230 lb (104.3 kg)   SpO2 99%   BMI 26.58 kg/m     Wt Readings from Last 3 Encounters:  06/09/23 230 lb (104.3 kg)  01/26/23 232 lb (105.2 kg)  12/22/22 232 lb (105.2 kg)     GEN:  Well nourished, well developed in no acute distress HEENT: Normal NECK: No JVD; No carotid bruits LYMPHATICS: No lymphadenopathy CARDIAC: Irregularly irregular, no murmurs, no rubs, no gallops RESPIRATORY:  Clear to auscultation without rales, wheezing or rhonchi  ABDOMEN: Soft, non-tender, non-distended MUSCULOSKELETAL:  No edema; No deformity  SKIN: Warm and dry LOWER EXTREMITIES: no swelling NEUROLOGIC:  Alert and oriented x 3 PSYCHIATRIC:  Normal affect   ASSESSMENT:    1. Paroxysmal atrial fibrillation (HCC)   2. Essential hypertension   3. Primary hypertension   4. Obstructive sleep apnea   5. Alcohol  abuse   6. Alcoholic cardiomyopathy (HCC)    PLAN:    In order of problems listed above:  Persistent atrial fibrillation, rate control actually slightly slow I will discontinue his diltiazem.  He takes diltiazem 30 twice daily we will stop that we will control his rate with carvedilol.  He is anticoag with Eliquis which I will continue.  Plan will be to bring him back to the office in about 3 to 4 weeks and then if he still in atrial fibrillation a TEE and cardioversion will be arranged. Cardiomyopathy severely reduced left ventricular ejection fraction good guideline directed medical therapy which I will continue.  Sadly because of low blood pressure I cannot augment his therapy.  I will discontinue diltiazem in the future to see if there is a role for higher dosages of medications. Dyslipidemia, he is statin has been withdrawn since he was in the hospital his liver function  test were elevated which was probably related to alcohol . Obstructive sleep apnea he does have history of it.  Will schedule him to have a sleep study   Medication Adjustments/Labs and Tests Ordered: Current medicines are reviewed at length with the patient today.  Concerns regarding medicines are outlined above.  Orders Placed This Encounter  Procedures   EKG 12-Lead   Medication changes: No orders of the defined types were placed in this encounter.   Signed, Manfred Seed, MD, [facility] 06/09/2023 10:34 AM    [facility]

## 2023-06-12 ENCOUNTER — Telehealth (HOSPITAL_COMMUNITY): Payer: Self-pay | Admitting: Licensed Clinical Social Worker

## 2023-06-12 NOTE — Telephone Encounter (Signed)
 The therapist attempts to reach Jimmy Black leaving a HIPAA-compliant voicemail. Jimmy Black left a voicemail this morning says that he was in a SA IOP 24-25 years ago and that he had a recent slip but currently has no desire to drink. He says that he may just need to resume AA but has questions about this program.  Melynda Stagger, MA, LCSW, [facility], LCAS 06/12/2023

## 2023-06-13 ENCOUNTER — Telehealth (HOSPITAL_COMMUNITY): Payer: Self-pay | Admitting: Licensed Clinical Social Worker

## 2023-06-13 NOTE — Telephone Encounter (Signed)
 The therapist calls Jimmy Black confirming his identity via two identifiers. He says that his younger brother, who is an advocate for him, has been pressuring him to get into something like the SA IOP. Nora says that he had a relapse after 25 years of sobriety which lasted a couple of months with his last drinking being on 05/31/23. He says that he has not had any additional thoughts of drinking and has resumed AA attendance.  He went to [facility] 25 years ago and was attending AA continuously for 15 years but got away from it but is now attending again in [facility].  He says that he likely relapsed as he was under some stress, isolating too much, and lonely. He sounds more open to individual therapy with AA attendance so schedules to see this therapist for an assessment on 06/22/23 at 10 a.m.  Melynda Stagger, MA, LCSW, [facility], LCAS 06/13/2023

## 2023-06-22 ENCOUNTER — Encounter: Payer: Medicare PPO | Admitting: Internal Medicine

## 2023-06-22 ENCOUNTER — Ambulatory Visit (HOSPITAL_COMMUNITY): Payer: Self-pay | Admitting: Licensed Clinical Social Worker

## 2023-06-22 DIAGNOSIS — Z79891 Long term (current) use of opiate analgesic: Secondary | ICD-10-CM | POA: Diagnosis not present

## 2023-06-22 DIAGNOSIS — G894 Chronic pain syndrome: Secondary | ICD-10-CM | POA: Diagnosis not present

## 2023-06-22 DIAGNOSIS — M545 Low back pain, unspecified: Secondary | ICD-10-CM | POA: Diagnosis not present

## 2023-06-22 DIAGNOSIS — G629 Polyneuropathy, unspecified: Secondary | ICD-10-CM | POA: Diagnosis not present

## 2023-06-22 DIAGNOSIS — M1612 Unilateral primary osteoarthritis, left hip: Secondary | ICD-10-CM | POA: Diagnosis not present

## 2023-06-29 ENCOUNTER — Ambulatory Visit (HOSPITAL_COMMUNITY): Payer: Self-pay | Admitting: Licensed Clinical Social Worker

## 2023-06-29 ENCOUNTER — Telehealth (HOSPITAL_COMMUNITY): Payer: Self-pay | Admitting: Licensed Clinical Social Worker

## 2023-06-29 ENCOUNTER — Encounter (HOSPITAL_COMMUNITY): Payer: Self-pay

## 2023-06-29 NOTE — Telephone Encounter (Signed)
 The therapist receives the following email from [place]:  [facility],  I apologize but I'm not going to be able to get over to my appointment this morning. I took the dog out this morning and noticed I had a flat tire. I tried to inflate it to no avail. There's a large screw in the middle of the tread! My sincere apologies. I will call the office and explain. I wanted to get this through to you asap. I will also reschedule. I hope this won't be too much of an inconvenience to you!  Best regards, Baptiste Odens [address], MA, LCSW, [facility], LCAS 06/29/2023

## 2023-07-10 ENCOUNTER — Ambulatory Visit: Admitting: Cardiology

## 2023-07-11 ENCOUNTER — Ambulatory Visit: Admitting: Cardiology

## 2023-07-13 ENCOUNTER — Ambulatory Visit (HOSPITAL_COMMUNITY): Admitting: Licensed Clinical Social Worker

## 2023-07-20 DIAGNOSIS — M1612 Unilateral primary osteoarthritis, left hip: Secondary | ICD-10-CM | POA: Diagnosis not present

## 2023-07-20 DIAGNOSIS — M545 Low back pain, unspecified: Secondary | ICD-10-CM | POA: Diagnosis not present

## 2023-07-20 DIAGNOSIS — Z79891 Long term (current) use of opiate analgesic: Secondary | ICD-10-CM | POA: Diagnosis not present

## 2023-07-20 DIAGNOSIS — G629 Polyneuropathy, unspecified: Secondary | ICD-10-CM | POA: Diagnosis not present

## 2023-07-20 DIAGNOSIS — G894 Chronic pain syndrome: Secondary | ICD-10-CM | POA: Diagnosis not present

## 2023-07-26 DIAGNOSIS — Z79891 Long term (current) use of opiate analgesic: Secondary | ICD-10-CM | POA: Insufficient documentation

## 2023-07-26 DIAGNOSIS — R29898 Other symptoms and signs involving the musculoskeletal system: Secondary | ICD-10-CM | POA: Insufficient documentation

## 2023-07-26 DIAGNOSIS — M545 Low back pain, unspecified: Secondary | ICD-10-CM | POA: Insufficient documentation

## 2023-07-26 DIAGNOSIS — M25561 Pain in right knee: Secondary | ICD-10-CM | POA: Insufficient documentation

## 2023-07-27 ENCOUNTER — Ambulatory Visit (HOSPITAL_COMMUNITY): Admitting: Licensed Clinical Social Worker

## 2023-07-27 ENCOUNTER — Ambulatory Visit: Attending: Cardiology | Admitting: Cardiology

## 2023-07-27 ENCOUNTER — Encounter: Payer: Self-pay | Admitting: Cardiology

## 2023-07-27 VITALS — BP 110/76 | HR 77 | Ht 78.0 in | Wt 233.2 lb

## 2023-07-27 DIAGNOSIS — I480 Paroxysmal atrial fibrillation: Secondary | ICD-10-CM | POA: Diagnosis not present

## 2023-07-27 DIAGNOSIS — I426 Alcoholic cardiomyopathy: Secondary | ICD-10-CM

## 2023-07-27 DIAGNOSIS — I10 Essential (primary) hypertension: Secondary | ICD-10-CM | POA: Diagnosis not present

## 2023-07-27 DIAGNOSIS — E782 Mixed hyperlipidemia: Secondary | ICD-10-CM | POA: Diagnosis not present

## 2023-07-27 NOTE — H&P (View-Only) (Signed)
 Cardiology Office Note:    Date:  07/27/2023   ID:  Jimmy Black, DOB 1960-08-12, MRN [id number]  PCP:  Marquetta Sit, MD  Cardiologist:  Ralene Burger, MD    Referring MD: Marquetta Sit, MD   Chief Complaint  Patient presents with   Atrial Fibrillation    History of Present Illness:    Jimmy Black is a 63 y.o. male  past medical history significant for alcohol  abuse, anxiety, paroxysmal atrial fibrillation. He been sober for more than 20 years however recently had relapsed with his alcohol  he started drinking for about 3 months and eventually end up coming to the hospital with confusion there was some question about stroke however likely he did not have it however he was fine to be in atrial fibrillation fast ventricular rate currently. Also have alcohol  withdrawal managed excellently by hospitalist team. He was also found to have severe cardiomyopathy with ejection fraction 20 to 25%. Etiology of this phenomenon could be alcohol  abuse and alcoholic or tachycardia induced cardiomyopathy. He was discharged home and follow-up with me last time of syncope was end of April.  At that time he was hemodynamically stable, ventricular rate of atrial fibrillation has been controlled he has been taking anticoagulation he has been participating in therapy for his alcoholism and since that time he has been sober.  Comes today to discuss his issues overall he is doing well he said he tried to walk on the regular basis denies having swelling of lower extremities no palpitation chest pain tightness squeezing pressure burning chest.  He is still in atrial fibrillation.  Rate appears to be controlled he continue taking Eliquis.  Past Medical History:  Diagnosis Date   Alcohol  abuse    Allergic rhinitis 06/21/2015   Anxiety    takes Paxil  daily   Anxiety state 03/07/2009   Qualifier: Diagnosis of  By: Moreno-Coll  MD, Adlih     Arthritis of right knee 11/16/2011   Atrial  fibrillation (HCC) 11/16/2018   Chicken pox    Chronic neuropathic pain 06/11/2019   Dysrhythmia    Family history of anesthesia complication    brother got sick after anesthesia   History of alcohol  abuse 08/17/2011   Hyperlipidemia    takes Fish Oil daily   Hypertension 03/20/2018   Joint pain    Joint swelling    KNEE PAIN, RIGHT 03/07/2009   Qualifier: Diagnosis of  By: Moreno-Coll  MD, Adlih     Numbness    both leg and feet   Obstructive sleep apnea 06/21/2015   Osteoarthritis    "right knee; both hips" (12/03/2012)   Palpitations 11/16/2018   Peripheral neuropathy    Sleep apnea    not started cpap as of 5-6 -2021 pv    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 Bilateral   Splenic laceration 12/21/2016    Past Surgical History:  Procedure Laterality Date   EXCISIONAL TOTAL KNEE ARTHROPLASTY Right 12/03/2012   Procedure: EXCISIONAL TOTAL KNEE ARTHROPLASTY (POLY SWAP), SCAR TISSUE REMOVAL, QUADRICEPSPLASTY;  Surgeon: Christie Cox, MD;  Location: [facility] OR;  Service: Orthopedics;  Laterality: Right;   INCISION AND DRAINAGE OF WOUND Right 2006   "w/jet lavage; had to do this twice after the OR" (12/03/2012)   KNEE SURGERY Right 1967, 1973, 1980   "benign tumor removal" (12/03/2012)   SHOULDER ARTHROSCOPY WITH OPEN ROTATOR CUFF REPAIR Right 2006   SHOULDER ARTHROSCOPY WITH OPEN ROTATOR CUFF  REPAIR Left 06/2012   SPERMATOCELECTOMY     TOTAL HIP ARTHROPLASTY Left 10/13/2022   Procedure: TOTAL HIP ARTHROPLASTY ANTERIOR APPROACH;  Surgeon: Adonica Hoose, MD;  Location: [facility] ORS;  Service: Orthopedics;  Laterality: Left;  120   TOTAL KNEE ARTHROPLASTY  11/16/2011   Procedure: TOTAL KNEE ARTHROPLASTY;  Surgeon: Ilean Mall, MD;  Location: [facility] OR;  Service: Orthopedics;  Laterality: Right;  right total knee arthroplasty   TOTAL KNEE REVISION WITH SCAR DEBRIDEMENT/PATELLA REVISION WITH POLY EXCHANGE Right 12/03/2012   "not sure what they  did exactly" (10/'13/2014)    Current Medications: Current Meds  Medication Sig   buprenorphine  (SUBUTEX ) 8 MG SUBL SL tablet Place 8 mg under the tongue daily.   carvedilol (COREG) 12.5 MG tablet Take 12.5 mg by mouth 2 (two) times daily.   ELIQUIS 5 MG TABS tablet Take 5 mg by mouth 2 (two) times daily.   ENTRESTO 24-26 MG Take 1 tablet by mouth 2 (two) times daily.   PARoxetine  (PAXIL ) 20 MG tablet TAKE 1 TABLET BY MOUTH EVERY DAY   spironolactone (ALDACTONE) 25 MG tablet Take 12.5 mg by mouth daily.   thiamine (VITAMIN B1) 100 MG tablet Take 100 mg by mouth daily.     Allergies:   Patient has no known allergies.   Social History   Socioeconomic History   Marital status: Divorced    Spouse name: Not on file   Number of children: Not on file   Years of education: Not on file   Highest education level: Associate degree: occupational, Scientist, product/process development, or vocational program  Occupational History   Not on file  Tobacco Use   Smoking status: Never   Smokeless tobacco: Current    Types: Snuff, Chew  Vaping Use   Vaping status: Never Used  Substance and Sexual Activity   Alcohol  use: Not Currently    Comment: 12/03/2012 "quit all alcohol  in 2000; family hx of problems w/it"   Drug use: No   Sexual activity: Not Currently  Other Topics Concern   Not on file  Social History Narrative   Not on file   Social Drivers of Health   Financial Resource Strain: Medium Risk (09/05/2022)   Overall Financial Resource Strain (CARDIA)    Difficulty of Paying Living Expenses: Somewhat hard  Food Insecurity: No Food Insecurity (10/13/2022)   Hunger Vital Sign    Worried About Running Out of Food in the Last Year: Never true    Ran Out of Food in the Last Year: Never true  Transportation Needs: No Transportation Needs (10/13/2022)   PRAPARE - Transportation    Lack of Transportation (Medical): No    Lack of Transportation (Non-Medical): No  Physical Activity: Sufficiently Active (09/05/2022)    Exercise Vital Sign    Days of Exercise per Week: 4 days    Minutes of Exercise per Session: 40 min  Stress: No Stress Concern Present (09/05/2022)   [employer] of Occupational Health - Occupational Stress Questionnaire    Feeling of Stress : Only a little  Social Connections: Moderately Integrated (09/05/2022)   Social Connection and Isolation Panel [NHANES]    Frequency of Communication with Friends and Family: Twice a week    Frequency of Social Gatherings with Friends and Family: Once a week    Attends Religious Services: More than 4 times per year    Active Member of [employer] or Organizations: Yes    Attends Banker Meetings: More than 4 times per year  Marital Status: Divorced     Family History: The patient's family history includes Alcohol  abuse in his paternal aunt and paternal grandfather; Breast cancer in his cousin, maternal aunt, and mother; Cancer in his father and maternal aunt; Cancer (age of onset: 58) in his mother; Heart disease in his father, maternal grandfather, and paternal grandfather; Hyperlipidemia in his brother and mother; Prostate cancer in his father. There is no history of Colon cancer, Colon polyps, Esophageal cancer, Rectal cancer, or Stomach cancer. ROS:   Please see the history of present illness.    All 14 point review of systems negative except as described per history of present illness  EKGs/Labs/Other Studies Reviewed:    EKG Interpretation Date/Time:  Thursday July 27 2023 11:26:03 EDT Ventricular Rate:  93 PR Interval:    QRS Duration:  94 QT Interval:  390 QTC Calculation: 484 R Axis:   -6  Text Interpretation: Atrial fibrillation Prolonged QT Abnormal ECG When compared with ECG of 09-Jun-2023 09:48, Questionable change in QRS axis Confirmed by Ralene Burger [phone]) on 07/27/2023 11:41:05 AM    Recent Labs: 12/22/2022: ALT 21; BUN 26; Creatinine, Ser 0.71; Hemoglobin 11.6; Platelets 299.0; Potassium 4.4; Sodium 137   Recent Lipid Panel    Component Value Date/Time   CHOL 143 03/18/2022 1107   TRIG 91.0 03/18/2022 1107   HDL 49.90 03/18/2022 1107   CHOLHDL 3 03/18/2022 1107   VLDL 18.2 03/18/2022 1107   LDLCALC 75 03/18/2022 1107   LDLDIRECT 110.0 03/20/2018 1206    Physical Exam:    VS:  BP 110/76 (BP Location: Left Arm, Patient Position: Sitting)   Pulse 77   Ht 6\' 6"  (1.981 m)   Wt 233 lb 3.2 oz (105.8 kg)   SpO2 97%   BMI 26.95 kg/m     Wt Readings from Last 3 Encounters:  07/27/23 233 lb 3.2 oz (105.8 kg)  06/09/23 230 lb (104.3 kg)  01/26/23 232 lb (105.2 kg)     GEN:  Well nourished, well developed in no acute distress HEENT: Normal NECK: No JVD; No carotid bruits LYMPHATICS: No lymphadenopathy CARDIAC: Irregularly irregular, no murmurs, no rubs, no gallops RESPIRATORY:  Clear to auscultation without rales, wheezing or rhonchi  ABDOMEN: Soft, non-tender, non-distended MUSCULOSKELETAL:  No edema; No deformity  SKIN: Warm and dry LOWER EXTREMITIES: no swelling NEUROLOGIC:  Alert and oriented x 3 PSYCHIATRIC:  Normal affect   ASSESSMENT:    1. Paroxysmal atrial fibrillation (HCC)   2. Alcoholic cardiomyopathy (HCC)   3. Primary hypertension   4. Mixed hyperlipidemia    PLAN:    In order of problems listed above:  Persistent atrial fibrillation, he is still in it.  I did discuss with him TEE and cardioversion he agreed to proceed.  Therefore, we will schedule him to have procedure done at [facility] as per his wishes.  Procedure explained to him including all risk benefits as well as alternatives.  If he failed cardioversion and then we will consider either antiarrhythmic medication or more likely ablation. Cardiomyopathy severely reduced left ventricle ejection fraction.  Overall clinically he is hemodynamically compensated [place]  Heart Association class II, he is on appropriate guideline directed medical therapy with some difficulty putting him on higher dosages  because of low blood pressure. Dyslipidemia he was taking statin however that is being withdrawn because of liver function abnormality.  Will recheck that before restarting the class of medication.  In terms of etiology of his cardiomyopathy could be alcohol  related or  tachycardia related coronary artery disease in the future will be rule out.   Medication Adjustments/Labs and Tests Ordered: Current medicines are reviewed at length with the patient today.  Concerns regarding medicines are outlined above.  Orders Placed This Encounter  Procedures   EKG 12-Lead   Medication changes: No orders of the defined types were placed in this encounter.   Signed, Manfred Seed, MD, [facility] 07/27/2023 11:54 AM    [facility]

## 2023-07-27 NOTE — Patient Instructions (Signed)
 Medication Instructions:  Your physician recommends that you continue on your current medications as directed. Please refer to the Current Medication list given to you today.  *If you need a refill on your cardiac medications before your next appointment, please call your pharmacy*   Lab Work: CMP, CBC- today If you have labs (blood work) drawn today and your tests are completely normal, you will receive your results only by: MyChart Message (if you have MyChart) OR A paper copy in the mail If you have any lab test that is abnormal or we need to change your treatment, we will call you to review the results.   Testing/Procedures:    Dear Trudi Fus Willoughby-Ray  You are scheduled for a TEE (Transesophageal Echocardiogram) Guided Cardioversion on Monday, June 9 with Dr. Alda Amas.  Please arrive at the [facility] (Main Entrance A) at [facility]: [address] [place], [place] [address] at 11:30 AM (This time is 1.5 hour(s) before your procedure to ensure your preparation).   Free valet parking service is available. You will check in at ADMITTING.   *Please Note: You will receive a call the day before your procedure to confirm the appointment time. That time may have changed from the original time based on the schedule for that day.*   DIET:  Nothing to eat or drink after midnight except a sip of water  with medications (see medication instructions below)   Continue taking your anticoagulant (blood thinner): Apixaban (Eliquis).  You will need to continue this after your procedure until you are told by your provider that it is safe to stop.    LABS: CMP, CBC- today    FYI:  For your safety, and to allow us  to monitor your vital signs accurately during the surgery/procedure we request: If you have artificial nails, gel coating, SNS etc, please have those removed prior to your surgery/procedure. Not having the nail coverings /polish removed may result in cancellation or delay of  your surgery/procedure.  Your support person will be asked to wait in the waiting room during your procedure.  It is OK to have someone drop you off and come back when you are ready to be discharged.  You cannot drive after the procedure and will need someone to drive you home.  Bring your insurance cards.  *Special Note: Every effort is made to have your procedure done on time. Occasionally there are emergencies that occur at the hospital that may cause delays. Please be patient if a delay does occur.       Follow-Up: At [facility], you and your health needs are our priority.  As part of our continuing mission to provide you with exceptional heart care, we have created designated Provider Care Teams.  These Care Teams include your primary Cardiologist (physician) and Advanced Practice Providers (APPs -  Physician Assistants and Nurse Practitioners) who all work together to provide you with the care you need, when you need it.  We recommend signing up for the patient portal called "MyChart".  Sign up information is provided on this After Visit Summary.  MyChart is used to connect with patients for Virtual Visits (Telemedicine).  Patients are able to view lab/test results, encounter notes, upcoming appointments, etc.  Non-urgent messages can be sent to your provider as well.   To learn more about what you can do with MyChart, go to [email or web address].    Your next appointment:   3 month(s)  The format for your next appointment:  In Person  Provider:   Ralene Burger, MD    Other Instructions NA

## 2023-07-27 NOTE — Progress Notes (Signed)
 Cardiology Office Note:    Date:  07/27/2023   ID:  Jimmy Black, DOB 1960-08-12, MRN [id number]  PCP:  Marquetta Sit, MD  Cardiologist:  Ralene Burger, MD    Referring MD: Marquetta Sit, MD   Chief Complaint  Patient presents with   Atrial Fibrillation    History of Present Illness:    Jimmy Black is a 63 y.o. male  past medical history significant for alcohol  abuse, anxiety, paroxysmal atrial fibrillation. He been sober for more than 20 years however recently had relapsed with his alcohol  he started drinking for about 3 months and eventually end up coming to the hospital with confusion there was some question about stroke however likely he did not have it however he was fine to be in atrial fibrillation fast ventricular rate currently. Also have alcohol  withdrawal managed excellently by hospitalist team. He was also found to have severe cardiomyopathy with ejection fraction 20 to 25%. Etiology of this phenomenon could be alcohol  abuse and alcoholic or tachycardia induced cardiomyopathy. He was discharged home and follow-up with me last time of syncope was end of April.  At that time he was hemodynamically stable, ventricular rate of atrial fibrillation has been controlled he has been taking anticoagulation he has been participating in therapy for his alcoholism and since that time he has been sober.  Comes today to discuss his issues overall he is doing well he said he tried to walk on the regular basis denies having swelling of lower extremities no palpitation chest pain tightness squeezing pressure burning chest.  He is still in atrial fibrillation.  Rate appears to be controlled he continue taking Eliquis.  Past Medical History:  Diagnosis Date   Alcohol  abuse    Allergic rhinitis 06/21/2015   Anxiety    takes Paxil  daily   Anxiety state 03/07/2009   Qualifier: Diagnosis of  By: Moreno-Coll  MD, Adlih     Arthritis of right knee 11/16/2011   Atrial  fibrillation (HCC) 11/16/2018   Chicken pox    Chronic neuropathic pain 06/11/2019   Dysrhythmia    Family history of anesthesia complication    brother got sick after anesthesia   History of alcohol  abuse 08/17/2011   Hyperlipidemia    takes Fish Oil daily   Hypertension 03/20/2018   Joint pain    Joint swelling    KNEE PAIN, RIGHT 03/07/2009   Qualifier: Diagnosis of  By: Moreno-Coll  MD, Adlih     Numbness    both leg and feet   Obstructive sleep apnea 06/21/2015   Osteoarthritis    "right knee; both hips" (12/03/2012)   Palpitations 11/16/2018   Peripheral neuropathy    Sleep apnea    not started cpap as of 5-6 -2021 pv    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 Bilateral   Splenic laceration 12/21/2016    Past Surgical History:  Procedure Laterality Date   EXCISIONAL TOTAL KNEE ARTHROPLASTY Right 12/03/2012   Procedure: EXCISIONAL TOTAL KNEE ARTHROPLASTY (POLY SWAP), SCAR TISSUE REMOVAL, QUADRICEPSPLASTY;  Surgeon: Christie Cox, MD;  Location: [facility] OR;  Service: Orthopedics;  Laterality: Right;   INCISION AND DRAINAGE OF WOUND Right 2006   "w/jet lavage; had to do this twice after the OR" (12/03/2012)   KNEE SURGERY Right 1967, 1973, 1980   "benign tumor removal" (12/03/2012)   SHOULDER ARTHROSCOPY WITH OPEN ROTATOR CUFF REPAIR Right 2006   SHOULDER ARTHROSCOPY WITH OPEN ROTATOR CUFF  REPAIR Left 06/2012   SPERMATOCELECTOMY     TOTAL HIP ARTHROPLASTY Left 10/13/2022   Procedure: TOTAL HIP ARTHROPLASTY ANTERIOR APPROACH;  Surgeon: Adonica Hoose, MD;  Location: [facility];  Service: Orthopedics;  Laterality: Left;  120   TOTAL KNEE ARTHROPLASTY  11/16/2011   Procedure: TOTAL KNEE ARTHROPLASTY;  Surgeon: Ilean Mall, MD;  Location: [facility] OR;  Service: Orthopedics;  Laterality: Right;  right total knee arthroplasty   TOTAL KNEE REVISION WITH SCAR DEBRIDEMENT/PATELLA REVISION WITH POLY EXCHANGE Right 12/03/2012   "not sure what they  did exactly" (10/'13/2014)    Current Medications: Current Meds  Medication Sig   buprenorphine  (SUBUTEX ) 8 MG SUBL SL tablet Place 8 mg under the tongue daily.   carvedilol (COREG) 12.5 MG tablet Take 12.5 mg by mouth 2 (two) times daily.   ELIQUIS 5 MG TABS tablet Take 5 mg by mouth 2 (two) times daily.   ENTRESTO 24-26 MG Take 1 tablet by mouth 2 (two) times daily.   PARoxetine  (PAXIL ) 20 MG tablet TAKE 1 TABLET BY MOUTH EVERY DAY   spironolactone (ALDACTONE) 25 MG tablet Take 12.5 mg by mouth daily.   thiamine (VITAMIN B1) 100 MG tablet Take 100 mg by mouth daily.     Allergies:   Patient has no known allergies.   Social History   Socioeconomic History   Marital status: Divorced    Spouse name: Not on file   Number of children: Not on file   Years of education: Not on file   Highest education level: Associate degree: occupational, Scientist, product/process development, or vocational program  Occupational History   Not on file  Tobacco Use   Smoking status: Never   Smokeless tobacco: Current    Types: Snuff, Chew  Vaping Use   Vaping status: Never Used  Substance and Sexual Activity   Alcohol  use: Not Currently    Comment: 12/03/2012 "quit all alcohol  in 2000; family hx of problems w/it"   Drug use: No   Sexual activity: Not Currently  Other Topics Concern   Not on file  Social History Narrative   Not on file   Social Drivers of Health   Financial Resource Strain: Medium Risk (09/05/2022)   Overall Financial Resource Strain (CARDIA)    Difficulty of Paying Living Expenses: Somewhat hard  Food Insecurity: No Food Insecurity (10/13/2022)   Hunger Vital Sign    Worried About Running Out of Food in the Last Year: Never true    Ran Out of Food in the Last Year: Never true  Transportation Needs: No Transportation Needs (10/13/2022)   PRAPARE - Transportation    Lack of Transportation (Medical): No    Lack of Transportation (Non-Medical): No  Physical Activity: Sufficiently Active (09/05/2022)    Exercise Vital Sign    Days of Exercise per Week: 4 days    Minutes of Exercise per Session: 40 min  Stress: No Stress Concern Present (09/05/2022)   [employer] of Occupational Health - Occupational Stress Questionnaire    Feeling of Stress : Only a little  Social Connections: Moderately Integrated (09/05/2022)   Social Connection and Isolation Panel [NHANES]    Frequency of Communication with Friends and Family: Twice a week    Frequency of Social Gatherings with Friends and Family: Once a week    Attends Religious Services: More than 4 times per year    Active Member of [employer] or Organizations: Yes    Attends Banker Meetings: More than 4 times per year  Marital Status: Divorced     Family History: The patient's family history includes Alcohol  abuse in his paternal aunt and paternal grandfather; Breast cancer in his cousin, maternal aunt, and mother; Cancer in his father and maternal aunt; Cancer (age of onset: 58) in his mother; Heart disease in his father, maternal grandfather, and paternal grandfather; Hyperlipidemia in his brother and mother; Prostate cancer in his father. There is no history of Colon cancer, Colon polyps, Esophageal cancer, Rectal cancer, or Stomach cancer. ROS:   Please see the history of present illness.    All 14 point review of systems negative except as described per history of present illness  EKGs/Labs/Other Studies Reviewed:    EKG Interpretation Date/Time:  Thursday July 27 2023 11:26:03 EDT Ventricular Rate:  93 PR Interval:    QRS Duration:  94 QT Interval:  390 QTC Calculation: 484 R Axis:   -6  Text Interpretation: Atrial fibrillation Prolonged QT Abnormal ECG When compared with ECG of 09-Jun-2023 09:48, Questionable change in QRS axis Confirmed by Ralene Burger [phone]) on 07/27/2023 11:41:05 AM    Recent Labs: 12/22/2022: ALT 21; BUN 26; Creatinine, Ser 0.71; Hemoglobin 11.6; Platelets 299.0; Potassium 4.4; Sodium 137   Recent Lipid Panel    Component Value Date/Time   CHOL 143 03/18/2022 1107   TRIG 91.0 03/18/2022 1107   HDL 49.90 03/18/2022 1107   CHOLHDL 3 03/18/2022 1107   VLDL 18.2 03/18/2022 1107   LDLCALC 75 03/18/2022 1107   LDLDIRECT 110.0 03/20/2018 1206    Physical Exam:    VS:  BP 110/76 (BP Location: Left Arm, Patient Position: Sitting)   Pulse 77   Ht 6\' 6"  (1.981 m)   Wt 233 lb 3.2 oz (105.8 kg)   SpO2 97%   BMI 26.95 kg/m     Wt Readings from Last 3 Encounters:  07/27/23 233 lb 3.2 oz (105.8 kg)  06/09/23 230 lb (104.3 kg)  01/26/23 232 lb (105.2 kg)     GEN:  Well nourished, well developed in no acute distress HEENT: Normal NECK: No JVD; No carotid bruits LYMPHATICS: No lymphadenopathy CARDIAC: Irregularly irregular, no murmurs, no rubs, no gallops RESPIRATORY:  Clear to auscultation without rales, wheezing or rhonchi  ABDOMEN: Soft, non-tender, non-distended MUSCULOSKELETAL:  No edema; No deformity  SKIN: Warm and dry LOWER EXTREMITIES: no swelling NEUROLOGIC:  Alert and oriented x 3 PSYCHIATRIC:  Normal affect   ASSESSMENT:    1. Paroxysmal atrial fibrillation (HCC)   2. Alcoholic cardiomyopathy (HCC)   3. Primary hypertension   4. Mixed hyperlipidemia    PLAN:    In order of problems listed above:  Persistent atrial fibrillation, he is still in it.  I did discuss with him TEE and cardioversion he agreed to proceed.  Therefore, we will schedule him to have procedure done at [facility] as per his wishes.  Procedure explained to him including all risk benefits as well as alternatives.  If he failed cardioversion and then we will consider either antiarrhythmic medication or more likely ablation. Cardiomyopathy severely reduced left ventricle ejection fraction.  Overall clinically he is hemodynamically compensated [place]  Heart Association class II, he is on appropriate guideline directed medical therapy with some difficulty putting him on higher dosages  because of low blood pressure. Dyslipidemia he was taking statin however that is being withdrawn because of liver function abnormality.  Will recheck that before restarting the class of medication.  In terms of etiology of his cardiomyopathy could be alcohol  related or  tachycardia related coronary artery disease in the future will be rule out.   Medication Adjustments/Labs and Tests Ordered: Current medicines are reviewed at length with the patient today.  Concerns regarding medicines are outlined above.  Orders Placed This Encounter  Procedures   EKG 12-Lead   Medication changes: No orders of the defined types were placed in this encounter.   Signed, Manfred Seed, MD, [facility] 07/27/2023 11:54 AM    [facility]

## 2023-07-28 NOTE — Progress Notes (Signed)
 Spoke to patient and instructed them to come at 1145  and to be NPO after 0000.  Medications reviewed.    Confirmed that patient will have a ride home and someone to stay with them for 24 hours after the procedure.

## 2023-07-29 LAB — COMPREHENSIVE METABOLIC PANEL WITH GFR
ALT: 19 IU/L (ref 0–44)
AST: 24 IU/L (ref 0–40)
Albumin: 4.4 g/dL (ref 3.8–4.9)
Alkaline Phosphatase: 73 IU/L (ref 44–121)
BUN/Creatinine Ratio: 33 — ABNORMAL HIGH (ref 10–24)
BUN: 20 mg/dL (ref 8–27)
Bilirubin Total: 0.3 mg/dL (ref 0.0–1.2)
CO2: 16 mmol/L — ABNORMAL LOW (ref 20–29)
Calcium: 9.6 mg/dL (ref 8.6–10.2)
Chloride: 104 mmol/L (ref 96–106)
Creatinine, Ser: 0.61 mg/dL — ABNORMAL LOW (ref 0.76–1.27)
Globulin, Total: 2.3 g/dL (ref 1.5–4.5)
Glucose: 94 mg/dL (ref 70–99)
Potassium: 4.9 mmol/L (ref 3.5–5.2)
Sodium: 137 mmol/L (ref 134–144)
Total Protein: 6.7 g/dL (ref 6.0–8.5)
eGFR: 110 mL/min/{1.73_m2} (ref >59)

## 2023-07-29 LAB — CBC
Hematocrit: 44.1 % (ref 37.5–51.0)
Hemoglobin: 14.6 g/dL (ref 13.0–17.7)
MCH: 30 pg (ref 26.6–33.0)
MCHC: 33.1 g/dL (ref 31.5–35.7)
MCV: 91 fL (ref 79–97)
Platelets: 260 10*3/uL (ref 150–450)
RBC: 4.86 x10E6/uL (ref 4.14–5.80)
RDW: 14.6 % (ref 11.6–15.4)
WBC: 7.5 10*3/uL (ref 3.4–10.8)

## 2023-07-31 ENCOUNTER — Encounter (HOSPITAL_COMMUNITY)
Admission: RE | Disposition: A | Discharge status: Home / Self Care | Payer: Self-pay | Source: Home / Self Care | Attending: Cardiology

## 2023-07-31 ENCOUNTER — Ambulatory Visit (HOSPITAL_COMMUNITY): Admitting: Anesthesiology

## 2023-07-31 ENCOUNTER — Encounter (HOSPITAL_COMMUNITY): Payer: Self-pay

## 2023-07-31 ENCOUNTER — Ambulatory Visit (HOSPITAL_COMMUNITY)
Admission: RE | Admit: 2023-07-31 | Discharge: 2023-07-31 | Disposition: A | Discharge status: Home / Self Care | Attending: Cardiology | Admitting: Cardiology

## 2023-07-31 ENCOUNTER — Ambulatory Visit: Payer: Self-pay | Admitting: Cardiology

## 2023-07-31 ENCOUNTER — Other Ambulatory Visit: Payer: Self-pay

## 2023-07-31 ENCOUNTER — Ambulatory Visit (HOSPITAL_COMMUNITY)
Admission: RE | Admit: 2023-07-31 | Discharge: 2023-07-31 | Disposition: A | Discharge status: Home / Self Care | Source: Home / Self Care | Attending: Cardiology | Admitting: Cardiology

## 2023-07-31 DIAGNOSIS — Z79899 Other long term (current) drug therapy: Secondary | ICD-10-CM | POA: Insufficient documentation

## 2023-07-31 DIAGNOSIS — I480 Paroxysmal atrial fibrillation: Secondary | ICD-10-CM

## 2023-07-31 DIAGNOSIS — Q2543 Congenital aneurysm of aorta: Secondary | ICD-10-CM | POA: Insufficient documentation

## 2023-07-31 DIAGNOSIS — E785 Hyperlipidemia, unspecified: Secondary | ICD-10-CM | POA: Diagnosis not present

## 2023-07-31 DIAGNOSIS — F17290 Nicotine dependence, other tobacco product, uncomplicated: Secondary | ICD-10-CM | POA: Diagnosis not present

## 2023-07-31 DIAGNOSIS — I4819 Other persistent atrial fibrillation: Secondary | ICD-10-CM

## 2023-07-31 DIAGNOSIS — F419 Anxiety disorder, unspecified: Secondary | ICD-10-CM | POA: Diagnosis not present

## 2023-07-31 DIAGNOSIS — E782 Mixed hyperlipidemia: Secondary | ICD-10-CM | POA: Insufficient documentation

## 2023-07-31 DIAGNOSIS — Z7901 Long term (current) use of anticoagulants: Secondary | ICD-10-CM | POA: Insufficient documentation

## 2023-07-31 DIAGNOSIS — I10 Essential (primary) hypertension: Secondary | ICD-10-CM | POA: Insufficient documentation

## 2023-07-31 DIAGNOSIS — I426 Alcoholic cardiomyopathy: Secondary | ICD-10-CM | POA: Insufficient documentation

## 2023-07-31 HISTORY — PX: TRANSESOPHAGEAL ECHOCARDIOGRAM (CATH LAB): EP1270

## 2023-07-31 HISTORY — PX: CARDIOVERSION: EP1203

## 2023-07-31 LAB — POCT I-STAT, CHEM 8
BUN: 23 mg/dL — ABNORMAL HIGH (ref 6–20)
Calcium, Ion: 1.29 mmol/L (ref 1.15–1.40)
Chloride: 105 mmol/L (ref 98–111)
Creatinine, Ser: 0.7 mg/dL (ref 0.61–1.24)
Glucose, Bld: 86 mg/dL (ref 70–99)
HCT: 42 % (ref 39.0–52.0)
Hemoglobin: 14.3 g/dL (ref 13.0–17.0)
Potassium: 4.5 mmol/L (ref 3.5–5.1)
Sodium: 139 mmol/L (ref 135–145)
TCO2: 23 mmol/L (ref 22–32)

## 2023-07-31 LAB — ECHO TEE

## 2023-07-31 SURGERY — TRANSESOPHAGEAL ECHOCARDIOGRAM (TEE) (CATHLAB)
Anesthesia: General

## 2023-07-31 MED ORDER — SODIUM CHLORIDE 0.9% FLUSH: 3.0000 mL | Freq: Two times a day (BID) | INTRAVENOUS | Status: DC

## 2023-07-31 MED ORDER — SODIUM CHLORIDE 0.9% FLUSH: 3.0000 mL | INTRAVENOUS | Status: DC | PRN

## 2023-07-31 MED ORDER — LIDOCAINE 2% (20 MG/ML) 5 ML SYRINGE
INTRAMUSCULAR | Status: DC | PRN
  Administered 2023-07-31: 50 mg via INTRAVENOUS

## 2023-07-31 MED ORDER — SODIUM CHLORIDE 0.9 % IV SOLN: INTRAVENOUS | Status: DC | PRN

## 2023-07-31 MED ORDER — CARVEDILOL 6.25 MG PO TABS: 6.2500 mg | ORAL_TABLET | Freq: Two times a day (BID) | ORAL | 3 refills | Status: DC

## 2023-07-31 MED ORDER — PROPOFOL 10 MG/ML IV BOLUS
INTRAVENOUS | Status: DC | PRN
  Administered 2023-07-31 (×2): 30 mg via INTRAVENOUS
  Administered 2023-07-31: 50 mg via INTRAVENOUS
  Administered 2023-07-31: 30 mg via INTRAVENOUS
  Administered 2023-07-31 (×2): 20 mg via INTRAVENOUS
  Administered 2023-07-31: 30 mg via INTRAVENOUS
  Administered 2023-07-31: 50 mg via INTRAVENOUS

## 2023-07-31 SURGICAL SUPPLY — 1 items: PAD DEFIB RADIO PHYSIO CONN (PAD) ×1 IMPLANT

## 2023-07-31 NOTE — Anesthesia Postprocedure Evaluation (Signed)
 Anesthesia Post Note  Patient: Jimmy Black  Procedure(s) Performed: TRANSESOPHAGEAL ECHOCARDIOGRAM CARDIOVERSION     Patient location during evaluation: PACU Anesthesia Type: General Level of consciousness: awake and alert and oriented Pain management: pain level controlled Vital Signs Assessment: post-procedure vital signs reviewed and stable Respiratory status: spontaneous breathing, nonlabored ventilation and respiratory function stable Cardiovascular status: blood pressure returned to baseline and stable Postop Assessment: no apparent nausea or vomiting Anesthetic complications: no   There were no known notable events for this encounter.  Last Vitals:  Vitals:   07/31/23 1410 07/31/23 1415  BP: 108/78 116/76  Pulse: (!) 51 (!) 46  Resp: 14 12  Temp:    SpO2: 97% 97%    Last Pain:  Vitals:   07/31/23 1415  TempSrc:   PainSc: 0-No pain                 Beulah Capobianco A.

## 2023-07-31 NOTE — Anesthesia Preprocedure Evaluation (Addendum)
 Anesthesia Evaluation  Patient identified by MRN, date of birth, ID band Patient awake    Reviewed: Allergy & Precautions, NPO status , Patient's Chart, lab work & pertinent test results, reviewed documented beta blocker date and time   History of Anesthesia Complications (+) Family history of anesthesia reaction and history of anesthetic complications (brother got sick afterwards)  Airway Mallampati: II  TM Distance: >3 FB     Dental no notable dental hx. (+) Teeth Intact, Dental Advisory Given   Pulmonary sleep apnea    Pulmonary exam normal breath sounds clear to auscultation       Cardiovascular hypertension, Pt. on medications and Pt. on home beta blockers +CHF (EF 20-25% with mild-moderate reduced RV systolic function on echo 06/01/2023)  + dysrhythmias Atrial Fibrillation + Valvular Problems/Murmurs (mild) MR  Rhythm:Irregular Rate:Normal  HLD  Echo 04/01/19  1. Left ventricular ejection fraction, by estimation, is 55 to 60%. The  left ventricle has normal function. There is moderately increased left  ventricular hypertrophy. Left ventricular diastolic parameters are  consistent with Grade II diastolic  dysfunction (pseudonormalization).   2. Mild left atrium enlargement.   3. 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     Neuro/Psych  PSYCHIATRIC DISORDERS Anxiety     Peripheral neuropathy  Neuromuscular disease (neuropathy)    GI/Hepatic negative GI ROS,,,(+)     substance abuse  alcohol  use  Endo/Other  negative endocrine ROS    Renal/GU negative Renal ROS  negative genitourinary   Musculoskeletal  (+) Arthritis , Osteoarthritis,    Abdominal   Peds  Hematology Lab Results      Component                Value               Date                      WBC                      7.5                 07/27/2023                HGB                      14.6                07/27/2023                 HCT                      44.1                07/27/2023                MCV                      91                  07/27/2023                PLT                      260                 07/27/2023  Eliquis therapy- last dose this am   Anesthesia Other Findings   Reproductive/Obstetrics                              Anesthesia Physical Anesthesia Plan  ASA: 3  Anesthesia Plan: MAC   Post-op Pain Management: Minimal or no pain anticipated   Induction: Intravenous  PONV Risk Score and Plan: 1 and Propofol  infusion, TIVA and Treatment may vary due to age or medical condition  Airway Management Planned: Natural Airway and Nasal Cannula  Additional Equipment: None  Intra-op Plan:   Post-operative Plan:   Informed Consent: I have reviewed the patients History and Physical, chart, labs and discussed the procedure including the risks, benefits and alternatives for the proposed anesthesia with the patient or authorized representative who has indicated his/her understanding and acceptance.       Plan Discussed with: CRNA and Anesthesiologist  Anesthesia Plan Comments: (Discussed with patient risks of MAC including, but not limited to, minor pain or discomfort, hearing people in the room, and possible need for backup general anesthesia. Risks for general anesthesia also discussed including, but not limited to, sore throat, hoarse voice, chipped/damaged teeth, injury to vocal cords, nausea and vomiting, allergic reactions, lung infection, heart attack, stroke, and death. All questions answered. )         Anesthesia Quick Evaluation

## 2023-07-31 NOTE — Transfer of Care (Signed)
 Immediate Anesthesia Transfer of Care Note  Patient: Samrat Hayward Willoughby-Ray  Procedure(s) Performed: TRANSESOPHAGEAL ECHOCARDIOGRAM CARDIOVERSION  Patient Location: Cath Lab Holding 22  Anesthesia Type:MAC  Level of Consciousness: awake, alert , oriented, patient cooperative, and responds to stimulation  Airway & Oxygen Therapy: Patient Spontanous Breathing  Post-op Assessment: Report given to RN and Post -op Vital signs reviewed and stable  Post vital signs: Reviewed and stable  Last Vitals:  Vitals Value Taken Time  BP    Temp    Pulse 51 07/31/23 1410  Resp 15 07/31/23 1410  SpO2 97 % 07/31/23 1410  Vitals shown include unfiled device data.  Last Pain:  Vitals:   07/31/23 1204  TempSrc: Temporal         Complications: There were no known notable events for this encounter.

## 2023-07-31 NOTE — Interval H&P Note (Signed)
 History and Physical Interval Note:  07/31/2023 1:00 PM  Jimmy Black  has presented today for surgery, with the diagnosis of AFIB.  The various methods of treatment have been discussed with the patient and family. After consideration of risks, benefits and other options for treatment, the patient has consented to  Procedure(s): TRANSESOPHAGEAL ECHOCARDIOGRAM (N/A) CARDIOVERSION (N/A) as a surgical intervention.  The patient's history has been reviewed, patient examined, no change in status, stable for surgery.  I have reviewed the patient's chart and labs.  Questions were answered to the patient's satisfaction.     Wendie Hamburg

## 2023-07-31 NOTE — Addendum Note (Signed)
 Addendum  created 07/31/23 1433 by Katrinka Parr, CRNA   Intraprocedure Devices edited, Patient device added, Patient device edited

## 2023-07-31 NOTE — CV Procedure (Signed)
   TRANSESOPHAGEAL ECHOCARDIOGRAM GUIDED DIRECT CURRENT CARDIOVERSION  NAME:  Jimmy Black   MRN: [id number] DOB:  1960/10/24   ADMIT DATE: 07/31/2023  INDICATIONS: Symptomatic atrial fibrillation  PROCEDURE:   Informed consent was obtained prior to the procedure. The risks, benefits and alternatives for the procedure were discussed and the patient comprehended these risks.  Risks include, but are not limited to, cough, sore throat, vomiting, nausea, somnolence, esophageal and stomach trauma or perforation, bleeding, low blood pressure, aspiration, pneumonia, infection, trauma to the teeth and death.    After a procedural time-out, the oropharynx was anesthetized and the patient was sedated by the anesthesia service. The transesophageal probe was inserted in the esophagus and stomach without difficulty and multiple views were obtained. Anesthesia was monitored by Dr. Yvonnie Heritage and Adelene Homer, CRNA.   COMPLICATIONS:    Complications: No complications Patient tolerated procedure well.  FINDINGS:  Moderate systolic dysfunction.  No LAA thrombus   CARDIOVERSION:     Indications:  Symptomatic Atrial Fibrillation  Procedure Details:  Once the TEE was complete, the patient had the defibrillator pads placed in the anterior and posterior position. Once an appropriate level of sedation was confirmed, the patient was cardioverted x 1 with 200J of biphasic synchronized energy.  The patient converted to sinus bradycardia with rate 40-50s  There were no apparent complications.  The patient had normal neuro status and respiratory status post procedure with vitals stable as recorded elsewhere.  Adequate airway was maintained throughout and vital signs monitored per protocol.  Given bradycardia, will reduce coreg dose to 6.25 mg BID.  Carson Clara MD [facility]  [address], [address] [place], [place] [address] [phone]   2:12 PM

## 2023-08-02 ENCOUNTER — Encounter: Payer: Self-pay | Admitting: Cardiology

## 2023-08-03 ENCOUNTER — Telehealth: Payer: Self-pay

## 2023-08-03 MED ORDER — ROSUVASTATIN CALCIUM 10 MG PO TABS: 10.0000 mg | ORAL_TABLET | Freq: Every day | ORAL | 3 refills | Status: AC

## 2023-08-03 MED ORDER — SPIRONOLACTONE 25 MG PO TABS: 12.5000 mg | ORAL_TABLET | Freq: Every day | ORAL | 3 refills | Status: DC

## 2023-08-03 NOTE — Telephone Encounter (Signed)
 Spironolactone 25 #90 ref x 3, Crestor  10mg  #90 ref x 3 sent to [facility].

## 2023-08-05 ENCOUNTER — Other Ambulatory Visit: Payer: Self-pay | Admitting: Family Medicine

## 2023-08-07 ENCOUNTER — Ambulatory Visit: Admitting: Cardiology

## 2023-08-11 ENCOUNTER — Other Ambulatory Visit: Payer: Self-pay

## 2023-08-11 ENCOUNTER — Encounter: Payer: Self-pay | Admitting: Cardiology

## 2023-08-11 MED ORDER — ELIQUIS 5 MG PO TABS: 5.0000 mg | ORAL_TABLET | Freq: Two times a day (BID) | ORAL | 5 refills | Status: DC

## 2023-08-11 MED ORDER — ENTRESTO 24-26 MG PO TABS: 1.0000 | ORAL_TABLET | Freq: Two times a day (BID) | ORAL | 3 refills | Status: AC

## 2023-08-11 NOTE — Telephone Encounter (Signed)
 Prescription refill request for Eliquis received. Indication:afib Last office visit:6/25 Scr:0.70  6/25 Age: 63 Weight:104.3  kg  Prescription refilled

## 2023-08-15 ENCOUNTER — Telehealth: Payer: Self-pay

## 2023-08-15 NOTE — Telephone Encounter (Signed)
 Ordering provider: Dr. Krasowski  Associated diagnoses:  G47.33( Obstructed Sleep Apnea) I10 ( Essential Hypertension) I48.91 (Atrial Fibrillation) WatchPAT PA obtained on 08/15/2023 by Dena JAYSON Hesselbach, CMA. Authorization: Per [employer] there is not a PA require for CPT Code: 04199( Home Sleep Study) Patient notified of PIN ([phone]) on 08/15/2023 via Notification Method: phone.  Phone note routed to covering staff for follow-up.

## 2023-08-17 DIAGNOSIS — Z79891 Long term (current) use of opiate analgesic: Secondary | ICD-10-CM | POA: Diagnosis not present

## 2023-08-17 DIAGNOSIS — M1612 Unilateral primary osteoarthritis, left hip: Secondary | ICD-10-CM | POA: Diagnosis not present

## 2023-08-17 DIAGNOSIS — G894 Chronic pain syndrome: Secondary | ICD-10-CM | POA: Diagnosis not present

## 2023-08-17 DIAGNOSIS — M545 Low back pain, unspecified: Secondary | ICD-10-CM | POA: Diagnosis not present

## 2023-08-17 DIAGNOSIS — G629 Polyneuropathy, unspecified: Secondary | ICD-10-CM | POA: Diagnosis not present

## 2023-09-09 ENCOUNTER — Encounter: Payer: Self-pay | Admitting: Family Medicine

## 2023-09-14 DIAGNOSIS — G629 Polyneuropathy, unspecified: Secondary | ICD-10-CM | POA: Diagnosis not present

## 2023-09-14 DIAGNOSIS — G894 Chronic pain syndrome: Secondary | ICD-10-CM | POA: Diagnosis not present

## 2023-09-14 DIAGNOSIS — Z79891 Long term (current) use of opiate analgesic: Secondary | ICD-10-CM | POA: Diagnosis not present

## 2023-09-14 DIAGNOSIS — M545 Low back pain, unspecified: Secondary | ICD-10-CM | POA: Diagnosis not present

## 2023-09-14 DIAGNOSIS — M1612 Unilateral primary osteoarthritis, left hip: Secondary | ICD-10-CM | POA: Diagnosis not present

## 2023-09-30 DIAGNOSIS — S61211A Laceration without foreign body of left index finger without damage to nail, initial encounter: Secondary | ICD-10-CM | POA: Diagnosis not present

## 2023-10-27 ENCOUNTER — Ambulatory Visit: Admitting: Cardiology

## 2023-10-31 DIAGNOSIS — M1612 Unilateral primary osteoarthritis, left hip: Secondary | ICD-10-CM | POA: Diagnosis not present

## 2023-10-31 DIAGNOSIS — G894 Chronic pain syndrome: Secondary | ICD-10-CM | POA: Diagnosis not present

## 2023-10-31 DIAGNOSIS — M545 Low back pain, unspecified: Secondary | ICD-10-CM | POA: Diagnosis not present

## 2023-10-31 DIAGNOSIS — G629 Polyneuropathy, unspecified: Secondary | ICD-10-CM | POA: Diagnosis not present

## 2023-10-31 DIAGNOSIS — Z79891 Long term (current) use of opiate analgesic: Secondary | ICD-10-CM | POA: Diagnosis not present

## 2023-11-04 ENCOUNTER — Encounter: Payer: Self-pay | Admitting: Cardiology

## 2023-11-06 ENCOUNTER — Other Ambulatory Visit: Payer: Self-pay

## 2023-11-06 ENCOUNTER — Telehealth: Payer: Self-pay | Admitting: Pharmacist

## 2023-11-06 DIAGNOSIS — I4819 Other persistent atrial fibrillation: Secondary | ICD-10-CM

## 2023-11-06 MED ORDER — SPIRONOLACTONE 25 MG PO TABS: 12.5000 mg | ORAL_TABLET | Freq: Every day | ORAL | 3 refills | Status: AC

## 2023-11-06 MED ORDER — CARVEDILOL 6.25 MG PO TABS: 6.2500 mg | ORAL_TABLET | Freq: Two times a day (BID) | ORAL | 3 refills | Status: DC

## 2023-11-06 MED ORDER — APIXABAN 5 MG PO TABS: 5.0000 mg | ORAL_TABLET | Freq: Two times a day (BID) | ORAL | 5 refills | Status: AC

## 2023-11-06 NOTE — Telephone Encounter (Signed)
 Prescription refill request for Eliquis  received. Indication: a fib  Last office visit: 07/27/23 Scr: 0.61 epic 07/27/23 Age: 63 Weight: 104kg

## 2023-11-06 NOTE — Telephone Encounter (Signed)
-----   Message from Nurse Olam DEL sent at 11/06/2023  7:29 AM EDT ----- Pt requesting Eliquis  ref - 90 day supply. Has appt in Nov for regular follow up. TY

## 2023-11-16 ENCOUNTER — Other Ambulatory Visit: Payer: Self-pay | Admitting: Family Medicine

## 2023-12-22 ENCOUNTER — Encounter: Payer: Self-pay | Admitting: Cardiology

## 2023-12-22 ENCOUNTER — Other Ambulatory Visit: Payer: Self-pay

## 2023-12-22 MED ORDER — CARVEDILOL 6.25 MG PO TABS: 6.2500 mg | ORAL_TABLET | Freq: Two times a day (BID) | ORAL | 3 refills | Status: AC

## 2023-12-22 NOTE — Telephone Encounter (Signed)
 Pt reported he had been taking Carvedilol  6.25mg  2 tablets BID. I see no other changes in the chart from the original 6.25mg  twice daily. Advised pt to take as prescribed and call with any problems. Refilled Carvedilol  as he has run out of medications too soon.

## 2024-01-01 ENCOUNTER — Other Ambulatory Visit: Payer: Self-pay | Admitting: Family Medicine

## 2024-01-02 DIAGNOSIS — M545 Low back pain, unspecified: Secondary | ICD-10-CM | POA: Diagnosis not present

## 2024-01-02 DIAGNOSIS — Z79891 Long term (current) use of opiate analgesic: Secondary | ICD-10-CM | POA: Diagnosis not present

## 2024-01-02 DIAGNOSIS — M1612 Unilateral primary osteoarthritis, left hip: Secondary | ICD-10-CM | POA: Diagnosis not present

## 2024-01-02 DIAGNOSIS — G894 Chronic pain syndrome: Secondary | ICD-10-CM | POA: Diagnosis not present

## 2024-01-02 DIAGNOSIS — G629 Polyneuropathy, unspecified: Secondary | ICD-10-CM | POA: Diagnosis not present

## 2024-01-13 DIAGNOSIS — L03115 Cellulitis of right lower limb: Secondary | ICD-10-CM | POA: Diagnosis not present

## 2024-01-15 ENCOUNTER — Encounter: Payer: Self-pay | Admitting: *Deleted

## 2024-01-15 DIAGNOSIS — R4781 Slurred speech: Secondary | ICD-10-CM | POA: Insufficient documentation

## 2024-01-15 DIAGNOSIS — R7401 Elevation of levels of liver transaminase levels: Secondary | ICD-10-CM | POA: Insufficient documentation

## 2024-01-15 DIAGNOSIS — I429 Cardiomyopathy, unspecified: Secondary | ICD-10-CM | POA: Insufficient documentation

## 2024-01-15 DIAGNOSIS — Z8669 Personal history of other diseases of the nervous system and sense organs: Secondary | ICD-10-CM | POA: Insufficient documentation

## 2024-01-15 DIAGNOSIS — I5022 Chronic systolic (congestive) heart failure: Secondary | ICD-10-CM | POA: Insufficient documentation

## 2024-01-15 DIAGNOSIS — R4701 Aphasia: Secondary | ICD-10-CM | POA: Insufficient documentation

## 2024-01-15 DIAGNOSIS — M1610 Unilateral primary osteoarthritis, unspecified hip: Secondary | ICD-10-CM | POA: Insufficient documentation

## 2024-01-15 DIAGNOSIS — F10139 Alcohol abuse with withdrawal, unspecified: Secondary | ICD-10-CM | POA: Insufficient documentation

## 2024-01-17 ENCOUNTER — Ambulatory Visit: Admitting: Cardiology

## 2024-01-28 ENCOUNTER — Other Ambulatory Visit: Payer: Self-pay | Admitting: Family Medicine

## 2024-01-31 ENCOUNTER — Ambulatory Visit

## 2024-02-28 ENCOUNTER — Encounter: Admitting: Family Medicine

## 2024-02-29 ENCOUNTER — Other Ambulatory Visit: Payer: Self-pay | Admitting: Family Medicine

## 2024-03-25 ENCOUNTER — Ambulatory Visit: Admitting: Cardiology

## 2024-04-26 ENCOUNTER — Ambulatory Visit

## 2024-05-28 ENCOUNTER — Ambulatory Visit: Admitting: Cardiology
